# Patient Record
Sex: Female | Born: 1960 | Race: Black or African American | Hispanic: No | Marital: Single | Smoking: Never smoker
Health system: Southern US, Community
[De-identification: ages and names within clinical notes are randomized; demographics above are authoritative.]

## PROBLEM LIST (undated history)

## (undated) DIAGNOSIS — B009 Herpesviral infection, unspecified: Secondary | ICD-10-CM

## (undated) DIAGNOSIS — K5900 Constipation, unspecified: Secondary | ICD-10-CM

## (undated) DIAGNOSIS — A64 Unspecified sexually transmitted disease: Secondary | ICD-10-CM

## (undated) DIAGNOSIS — E559 Vitamin D deficiency, unspecified: Secondary | ICD-10-CM

## (undated) DIAGNOSIS — N809 Endometriosis, unspecified: Secondary | ICD-10-CM

## (undated) DIAGNOSIS — D219 Benign neoplasm of connective and other soft tissue, unspecified: Secondary | ICD-10-CM

## (undated) DIAGNOSIS — J302 Other seasonal allergic rhinitis: Secondary | ICD-10-CM

## (undated) DIAGNOSIS — N870 Mild cervical dysplasia: Secondary | ICD-10-CM

## (undated) DIAGNOSIS — M1990 Unspecified osteoarthritis, unspecified site: Secondary | ICD-10-CM

## (undated) DIAGNOSIS — M549 Dorsalgia, unspecified: Secondary | ICD-10-CM

## (undated) DIAGNOSIS — E739 Lactose intolerance, unspecified: Secondary | ICD-10-CM

## (undated) DIAGNOSIS — M2550 Pain in unspecified joint: Secondary | ICD-10-CM

## (undated) DIAGNOSIS — K219 Gastro-esophageal reflux disease without esophagitis: Secondary | ICD-10-CM

## (undated) DIAGNOSIS — G4700 Insomnia, unspecified: Secondary | ICD-10-CM

## (undated) DIAGNOSIS — N951 Menopausal and female climacteric states: Secondary | ICD-10-CM

## (undated) HISTORY — DX: Pain in unspecified joint: M25.50

## (undated) HISTORY — DX: Herpesviral infection, unspecified: B00.9

## (undated) HISTORY — DX: Unspecified osteoarthritis, unspecified site: M19.90

## (undated) HISTORY — DX: Vitamin D deficiency, unspecified: E55.9

## (undated) HISTORY — DX: Unspecified sexually transmitted disease: A64

## (undated) HISTORY — DX: Insomnia, unspecified: G47.00

## (undated) HISTORY — DX: Other seasonal allergic rhinitis: J30.2

## (undated) HISTORY — DX: Mild cervical dysplasia: N87.0

## (undated) HISTORY — DX: Constipation, unspecified: K59.00

## (undated) HISTORY — DX: Benign neoplasm of connective and other soft tissue, unspecified: D21.9

## (undated) HISTORY — DX: Dorsalgia, unspecified: M54.9

## (undated) HISTORY — DX: Endometriosis, unspecified: N80.9

## (undated) HISTORY — DX: Lactose intolerance, unspecified: E73.9

## (undated) HISTORY — DX: Menopausal and female climacteric states: N95.1

## (undated) HISTORY — DX: Gastro-esophageal reflux disease without esophagitis: K21.9

---

## 1990-05-15 DIAGNOSIS — N870 Mild cervical dysplasia: Secondary | ICD-10-CM

## 1990-05-15 HISTORY — PX: CERVIX LESION DESTRUCTION: SHX591

## 1990-05-15 HISTORY — DX: Mild cervical dysplasia: N87.0

## 1993-11-13 DIAGNOSIS — D219 Benign neoplasm of connective and other soft tissue, unspecified: Secondary | ICD-10-CM

## 1993-11-13 HISTORY — DX: Benign neoplasm of connective and other soft tissue, unspecified: D21.9

## 1998-08-06 ENCOUNTER — Other Ambulatory Visit: Admission: RE | Admit: 1998-08-06 | Discharge: 1998-08-06 | Payer: Self-pay | Admitting: *Deleted

## 1999-08-03 ENCOUNTER — Other Ambulatory Visit: Admission: RE | Admit: 1999-08-03 | Discharge: 1999-08-03 | Payer: Self-pay | Admitting: *Deleted

## 2000-07-18 ENCOUNTER — Ambulatory Visit: Admission: RE | Admit: 2000-07-18 | Discharge: 2000-07-18 | Payer: Self-pay | Admitting: Gynecology

## 2000-08-10 ENCOUNTER — Other Ambulatory Visit: Admission: RE | Admit: 2000-08-10 | Discharge: 2000-08-10 | Payer: Self-pay | Admitting: *Deleted

## 2000-08-13 HISTORY — PX: LAPAROSCOPY: SHX197

## 2000-08-21 ENCOUNTER — Encounter (INDEPENDENT_AMBULATORY_CARE_PROVIDER_SITE_OTHER): Payer: Self-pay | Admitting: *Deleted

## 2000-08-21 ENCOUNTER — Inpatient Hospital Stay (HOSPITAL_COMMUNITY): Admission: RE | Admit: 2000-08-21 | Discharge: 2000-08-24 | Payer: Self-pay | Admitting: Obstetrics and Gynecology

## 2001-07-14 HISTORY — PX: BREAST SURGERY: SHX581

## 2001-07-18 ENCOUNTER — Other Ambulatory Visit: Admission: RE | Admit: 2001-07-18 | Discharge: 2001-07-18 | Payer: Self-pay | Admitting: *Deleted

## 2001-07-23 ENCOUNTER — Encounter: Payer: Self-pay | Admitting: *Deleted

## 2001-07-23 ENCOUNTER — Encounter: Admission: RE | Admit: 2001-07-23 | Discharge: 2001-07-23 | Payer: Self-pay | Admitting: *Deleted

## 2002-08-06 ENCOUNTER — Other Ambulatory Visit: Admission: RE | Admit: 2002-08-06 | Discharge: 2002-08-06 | Payer: Self-pay | Admitting: *Deleted

## 2003-07-09 ENCOUNTER — Ambulatory Visit (HOSPITAL_COMMUNITY): Admission: RE | Admit: 2003-07-09 | Discharge: 2003-07-09 | Payer: Self-pay | Admitting: *Deleted

## 2003-07-15 HISTORY — PX: UMBILICAL EXPLORATION: SHX2596

## 2003-08-06 ENCOUNTER — Ambulatory Visit (HOSPITAL_COMMUNITY): Admission: RE | Admit: 2003-08-06 | Discharge: 2003-08-06 | Payer: Self-pay | Admitting: General Surgery

## 2003-08-06 ENCOUNTER — Encounter (INDEPENDENT_AMBULATORY_CARE_PROVIDER_SITE_OTHER): Payer: Self-pay | Admitting: Specialist

## 2003-09-11 ENCOUNTER — Other Ambulatory Visit: Admission: RE | Admit: 2003-09-11 | Discharge: 2003-09-11 | Payer: Self-pay | Admitting: *Deleted

## 2004-08-02 ENCOUNTER — Encounter: Admission: RE | Admit: 2004-08-02 | Discharge: 2004-08-02 | Payer: Self-pay | Admitting: *Deleted

## 2004-08-11 ENCOUNTER — Encounter: Admission: RE | Admit: 2004-08-11 | Discharge: 2004-08-11 | Payer: Self-pay | Admitting: *Deleted

## 2004-10-26 ENCOUNTER — Other Ambulatory Visit: Admission: RE | Admit: 2004-10-26 | Discharge: 2004-10-26 | Payer: Self-pay | Admitting: *Deleted

## 2005-02-13 DIAGNOSIS — N951 Menopausal and female climacteric states: Secondary | ICD-10-CM

## 2005-02-13 HISTORY — DX: Menopausal and female climacteric states: N95.1

## 2005-08-08 ENCOUNTER — Ambulatory Visit: Payer: Self-pay | Admitting: Family Medicine

## 2005-08-14 ENCOUNTER — Ambulatory Visit: Payer: Self-pay | Admitting: Family Medicine

## 2005-10-20 ENCOUNTER — Encounter: Admission: RE | Admit: 2005-10-20 | Discharge: 2005-10-20 | Payer: Self-pay | Admitting: Obstetrics and Gynecology

## 2005-11-07 ENCOUNTER — Other Ambulatory Visit: Admission: RE | Admit: 2005-11-07 | Discharge: 2005-11-07 | Payer: Self-pay | Admitting: Obstetrics & Gynecology

## 2006-06-14 ENCOUNTER — Ambulatory Visit: Payer: Self-pay | Admitting: Family Medicine

## 2007-01-29 ENCOUNTER — Ambulatory Visit (HOSPITAL_COMMUNITY): Admission: RE | Admit: 2007-01-29 | Discharge: 2007-01-29 | Payer: Self-pay | Admitting: Obstetrics & Gynecology

## 2007-02-18 ENCOUNTER — Other Ambulatory Visit: Admission: RE | Admit: 2007-02-18 | Discharge: 2007-02-18 | Payer: Self-pay | Admitting: Obstetrics and Gynecology

## 2007-09-14 DIAGNOSIS — B009 Herpesviral infection, unspecified: Secondary | ICD-10-CM

## 2007-09-14 DIAGNOSIS — A64 Unspecified sexually transmitted disease: Secondary | ICD-10-CM

## 2007-09-14 HISTORY — DX: Herpesviral infection, unspecified: B00.9

## 2007-09-14 HISTORY — DX: Unspecified sexually transmitted disease: A64

## 2007-09-20 ENCOUNTER — Other Ambulatory Visit: Admission: RE | Admit: 2007-09-20 | Discharge: 2007-09-20 | Payer: Self-pay | Admitting: Obstetrics and Gynecology

## 2007-11-04 ENCOUNTER — Other Ambulatory Visit: Admission: RE | Admit: 2007-11-04 | Discharge: 2007-11-04 | Payer: Self-pay | Admitting: Obstetrics and Gynecology

## 2008-02-14 DIAGNOSIS — E559 Vitamin D deficiency, unspecified: Secondary | ICD-10-CM

## 2008-02-14 HISTORY — DX: Vitamin D deficiency, unspecified: E55.9

## 2008-08-10 ENCOUNTER — Ambulatory Visit: Payer: Self-pay | Admitting: Family Medicine

## 2008-09-11 ENCOUNTER — Ambulatory Visit: Payer: Self-pay | Admitting: Family Medicine

## 2008-09-11 ENCOUNTER — Encounter: Admission: RE | Admit: 2008-09-11 | Discharge: 2008-09-11 | Payer: Self-pay | Admitting: Family Medicine

## 2008-10-02 ENCOUNTER — Ambulatory Visit: Payer: Self-pay | Admitting: Family Medicine

## 2008-11-16 ENCOUNTER — Ambulatory Visit: Payer: Self-pay | Admitting: Family Medicine

## 2009-01-29 ENCOUNTER — Ambulatory Visit (HOSPITAL_COMMUNITY): Admission: RE | Admit: 2009-01-29 | Discharge: 2009-01-29 | Payer: Self-pay | Admitting: Obstetrics and Gynecology

## 2009-06-10 ENCOUNTER — Encounter
Admission: RE | Admit: 2009-06-10 | Discharge: 2009-09-08 | Payer: Self-pay | Source: Home / Self Care | Admitting: Orthopedic Surgery

## 2009-12-14 HISTORY — PX: KNEE SURGERY: SHX244

## 2010-02-04 ENCOUNTER — Ambulatory Visit (HOSPITAL_COMMUNITY)
Admission: RE | Admit: 2010-02-04 | Discharge: 2010-02-04 | Payer: Self-pay | Source: Home / Self Care | Attending: Obstetrics and Gynecology | Admitting: Obstetrics and Gynecology

## 2010-03-06 ENCOUNTER — Encounter: Payer: Self-pay | Admitting: Obstetrics and Gynecology

## 2010-03-07 ENCOUNTER — Encounter: Payer: Self-pay | Admitting: Family Medicine

## 2010-03-07 ENCOUNTER — Encounter: Payer: Self-pay | Admitting: Obstetrics & Gynecology

## 2010-07-01 NOTE — Op Note (Signed)
NAME:  Crystal Simmons, Crystal Simmons                           ACCOUNT NO.:  [id number]   MEDICAL RECORD NO.:  [id number]                   PATIENT TYPE:  AMB   LOCATION:  DAY                                  FACILITY:  [facility]   PHYSICIAN:  Timothy E. Earlene Plater, M.D.              DATE OF BIRTH:  02/19/1960   DATE OF PROCEDURE:  08/06/2003  DATE OF DISCHARGE:                                 OPERATIVE REPORT   PREOPERATIVE DIAGNOSES:  Painful mass umbilicus.   POSTOPERATIVE DIAGNOSES:  Painful mass umbilicus, endometrioma.   OPERATION/PROCEDURE:  Exploration of abdominal wall, excision of mass.   SURGEON:  Timothy E. Earlene Plater, M.D.   ANESTHESIA:  General.   INDICATIONS FOR PROCEDURE:  [name] is 75, has a history of endometriosis  having had three prior surgeries at age 26, age 89 and age 45.  She  developed a painful mass over the past few months that has gotten  increasingly troublesome.  CT scan suggested endometrioma.  The patient  wishes to have a full excision. She agrees and understands. She is followed  closely by Andres Ege, M.D.  The patient was identified and the  permit signed.   DESCRIPTION OF PROCEDURE:  She was taken to the operating room, placed  supine, general endotracheal anesthesia administered.  The anterior  abdominal wall was prepped and draped in the usual fashion. There was a mass  on the right upper lateral curve of the umbilical indentation of  approximately 1 cm size.  I elected to use a prior laparoscopy horizontal  incision at the base of the umbilical indentation and did so, opened the  skin, dissected the subcutaneous tissue.  The apparent endometrioma was  incised including a portion of the depth of the umbilical skin and fascia  and the surrounding tissue. This had the appearance of an endometrioma  chocolate cyst type construction.  This was submitted for permanent section  and pathology with a copy to Andres Ege, M.D.  Bleeding was  controlled with  the cautery, the umbilical skin was carefully repaired from  beneath the skin with interrupted Monocryl sutures. The fascia was then  repaired with buried #0 Prolene sutures.  The subcutaneous tissue was  checked, all was clear, there was no bleeding.  The deep subcu was  approximated with 3-0 Vicryl, the skin was closed with 3-0 Monocryl.  Steri-  Strips applied and a dry sterile dressings.  Final counts correct. She  tolerated it well and was awakened and taken to the recovery room in good  condition.   Written and verbal instructions including Demerol with Phenergan tablets for  pain and she will be followed as an outpatient.  Timothy E. Earlene Plater, M.D.    TED/MEDQ  D:  08/06/2003  T:  08/06/2003  Job:  [id number]   cc:   Andres Ege, M.D.  [address], [address]  [place]  [place] [address]  Fax: [phone]

## 2010-07-01 NOTE — Op Note (Signed)
[facility]  Patient:    Crystal Simmons, Crystal Simmons                        MRN: [id number] Proc. Date: 08/21/00 Adm. Date:  [id number] Attending:  Jenean Lindau CC:         Laqueta Linden, M.D.  Telford Nab, R.N.   Operative Report  PREOPERATIVE DIAGNOSES:  Complex left adnexal mass.  Past history of multiple myomectomies and endometriosis.  POSTOPERATIVE DIAGNOSES:  Left ovarian fibroma, endometrioma.  Severe adhesions of the tube, ovary, small bowel, and sigmoid colon.  Retroperitoneal fibrosis.  Uterine fibroids.  PROCEDURE:  Exploratory laparotomy, extensive lysis of intestinal and tubal and ovarian adhesions, ureterolysis, left salpingo-oophorectomy.  CO-SURGEONS:  Laqueta Linden, M.D., Rande Brunt. Clarke-Pearson, M.D.  ASSISTANT:  Telford Nab, R.N.  ANESTHESIA:  General with orotracheal tube.  ESTIMATED BLOOD LOSS:  150 cc.  SURGICAL FINDINGS:  At exploratory laparotomy, the upper abdomen appeared normal.  The appendix appeared normal.  In the pelvis, there were multiple uterine fibroids from the fundus, most of which were subserosal, one of which was pedunculated.  The right tube and ovary were densely adherent to the right pelvic sidewall and cul-de-sac, but no other abnormality was noted.  The left tube and ovary appeared to contain a 3-4 cm ovarian fibroma as well as chocolate cyst consistent with endometriosis.  The internal aspect of the cyst had no papillations.  There was retroperitoneal fibrosis and dense adhesions of the tube and ovary to the left pelvic sidewall, broad ligament, sigmoid colon, and uterine fundus.  DESCRIPTION OF PROCEDURE:  The patient was brought to the operating room and after satisfactory attainment of general anesthesia, was placed in the modified lithotomy position and Allen stirrups.  The anterior abdominal wall, perineum, and vagina were prepped with Betadine, Foley catheter was placed, and the patient was  draped.  The abdomen was entered through a midline incision, excising the old scar.  Peritoneal washings were obtained from the pelvis, and the upper abdomen was explored.  Bookwalter retractor was assembled, and the bowel was packed out of the pelvis.  A loop of small bowel was found to be densely adherent deep in the cul-de-sac.  This was freed with sharp and blunt dissection.  The small bowel was inspected, and no injury to the serosa was found.  The remainder of the bowel was packed out of the pelvis.  The left retroperitoneal space was opened, identifying the external iliac artery, internal iliac artery, and ureter.  The ovarian vessels were skeletonized, clamped, cut, and suture ligated and as well as being free-tied with 2-0 Vicryl.  Using sharp and blunt dissection, the sigmoid colon was freed from the ovary.  In order to protect the ureter from resection of the left pelvic sidewall mass, ureterolysis performed using sharp and blunt dissection.  Hemoclips were used on occasion to achieve hemostasis.  Continued dissection of the broad ligament and the ovarian complex mass freed this from the deep pelvis and cul-de-sac.  The uterine ovarian anastomosis was cross-clamped, cut, and suture ligated using 2-0 Vicryl.  The left adnexal structures were inspected, and no evidence of malignancy was noted. Hemostasis was achieved in the pelvis using cautery and hemoclips.  The pelvis was irrigated with copious amounts of warm saline, and hemostasis was again noted.  Packs and retractors were removed.  The anterior abdominal wall was then closed in layers, the first being a running Smead-Jones closure  using #1 PDS.  The subcutaneous tissue was irrigated.  Hemostasis was achieved with cautery, and the subcutaneous tissue reapproximated in order to take tension off the skin incision; 3-0 Vicryl was used.  The skin was closed with skin staples.  A dressing was applied.  The patient was awakened from  anesthesia and taken to the recovery room in satisfactory condition.  Sponge, needle and instrument counts correct x 2. DD:  08/21/00 TD:  08/21/00 Job: [id number]

## 2010-07-01 NOTE — Discharge Summary (Signed)
[facility]  Patient:    Crystal Simmons, Crystal Simmons                        MRN: [id number] Adm. Date:  [id number] Disc. Date: [id number] Attending:  Jenean Lindau                           Discharge Summary  SERVICE:  Gynecology.  PRINCIPAL DIAGNOSIS ON DISCHARGE:  Recurrent endometriosis with endometrioma and hyalinized leiomyoma and severe tubo-ovarian adhesions of the left ovary and tube.  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  PROCEDURE:  Left salpingo-oophorectomy with exploratory laparotomy, extensive lysis of adhesions and ureterolysis.  COMPLICATIONS:  None.  TRANSFUSION:  None.  HISTORY OF PRESENT ILLNESS:  Crystal Simmons is a 50 year old African-American female with a long history of endometriosis who desired conservative management due to potential childbearing wishes.  She was noted recently to have a 2.4 x 2.0 cm solid mass in the left adnexa. with multiple associated cysts concerning for neoplasm versus endometriosis.  The patient was aware that she probably had extensive endometriosis throughout her pelvis but desired the most conservative surgery possible.  Due to her multiple prior surgeries and the anticipated complexity of the procedure, this procedure was scheduled with Dr. Emmaline Kluver who saw and evaluated the patient preoperatively. Please see dictated history and physical for full details  HOSPITAL COURSE:  The patient was admitted for same-day surgery having undergone a mechanical bowel prep and underwent the above-noted procedure without complications.  She was noted to have severe pelvic adhesions, retroperitoneal fibrosis, uterine fibroids, an endometrioma, and what was felt to be a left ovarian fibroma.  Estimated blood loss at surgery was 150 cc and she remained stable throughout the procedure.  Postoperatively, she had some initial severe nausea that finally improved  with Zofran.  She maintained excellent urinary output and stable vital signs.  On postoperative day #1, she began tolerating liquids and switched to oral analgesics.  She was voiding without problems.  Her hemoglobin was 10.0 down from a preoperative value of 12.2.  Her T max was 100.0 on postoperative day #1.  She advanced her diet to full liquids and continued to ambulate.  On postoperative day #2, she had had no flatus and was feeling sort of nauseated and bloated, also dizzy when she stood up.  However, she was able to ambulate and had normal bowel sounds and stable vital signs.  She was given simethicone and antiemetics and was reevaluated on postoperative day #3.  At that point, she reported that she had passed large amounts of flatus and felt much better. She was tolerating a general diet.  She had remained afebrile since the isolated temperature elevated to 100.  Her incision was healing well and staples were still in place at the time of discharge on postoperative day #3 in improved condition.  Pathology was reviewed with the patient and revealed endometriosis, a hyalinized leiomyoma, tubo-ovarian adhesions with hydrosalpinx with no evidence of malignancy.  The patient was discharged home on postoperative day #3.  She was given routine discharge instructions including both written and verbal instructions. She is to follow up in the office in one week for staple removal.  She was told to continue taking Advil or Aleve for pain.  She was given a prescription for Vicodin ES, dispensed #15, one to two q.4-6h. p.r.n. pain with one refill as she  became nauseated with the Percocet.  She was also given a prescription for Phenergan 25 mg, dispensed #10, one half to one p.o. q.4-6h. p.r.n. nausea with one refill and told to continue taking her birth control pills and was given a prescription for Mircette one cycle with three refills to discuss the long-term contraception plan after her  follow-up visit.  She was told to follow up in the office prior to her scheduled follow-up for excessive pain, fever, bleeding, incision problems, symptoms of bowel or bladder dysfunction, or symptoms of a DVT or a PE, or any other concerns. DD:  09/11/00 TD:  09/11/00 Job: [id number]

## 2010-07-01 NOTE — Consult Note (Signed)
[facility]  Patient:    Crystal Simmons, Crystal Simmons                        MRN: [id number] Proc. Date: 07/18/00 Adm. Date:  [id number] Attending:  Jeannette Corpus CC:         Andres Ege, M.D.  Laqueta Linden, M.D.  Telford Nab, R.N.   Consultation Report  HISTORY OF PRESENT ILLNESS:  [age]-year-old African-American female was seen at the request of Drs. Laqueta Linden and Katherine Mantle regarding management of a complex mass.  The patient has a long-standing history of uterine fibroids and ovarian cysts. In 1981, she underwent exploratory laparotomy.  The details are not fully known, but it sounds as though she had myomectomies and conservative resection of endometriosis.  The patient has attempted for over a decade to get pregnant but never has.  In 1991, she underwent laparoscopic evaluation and claims an ovarian cyst was removed at that time.  More recently, she has been under the care of Dr. Katherine Mantle, who has followed with ultrasound a mass in the left adnexa.  On most recent exam she has a 2.4 x 2.0 cm solid mass in the adnexa, as well as several associated cysts that have some thickening and some septations.  The uterus is felt slightly enlarged, consistent with fibroids which measure up to 4 cm in diameter.  She has regular cyclic menstrual periods although she does have some dysmenorrhea.  PAST MEDICAL HISTORY:  None.  PAST SURGICAL HISTORY:  See history of present illness.  DRUG ALLERGIES:  SULFA and CODEINE.  OBSTETRICAL HISTORY:  Gravida 0, para 0.  CURRENT MEDICATIONS:  Allegra, Flonase, and multivitamins.  FAMILY HISTORY:  Negative for gynecologic, breast, or colon cancer.  SOCIAL HISTORY:  The patient is single, does not smoke.  REVIEW OF SYSTEMS:  Entirely negative.  PHYSICAL EXAMINATION:  VITAL SIGNS:  Height 5 feet 7 inches, weight 190 pounds.  Blood pressure 118/80, pulse 80, respiratory rate  16.  GENERAL:  Healthy, African-American female in no acute distress.  HEENT:  Negative.  NECK:  Supple.  Without thyromegaly.  There is no supraclavicular or inguinal adenopathy.  ABDOMEN:  Soft and nontender.  No masses, organomegaly, ascites, or hernias noted.  She has a well-healed subumbilical laparoscopy scar as well as a midline incision in the lower abdomen.  PELVIC:  EGBUS is normal.  Vagina is clean, well-supported.  Cervix is normal. The uterus is anterior, approximately [redacted] weeks gestational size, with multiple fibroids.  I do not feel any discrete pelvic masses.  The records from Dr. Larence Penning office, including the ultrasound reports, are reviewed.  IMPRESSION:  Complex adnexal mass, increasing in size.  This is most likely endometriosis, but I agree with Dr. Laurena Bering that surgical intervention is appropriate.  We recommend she undergo exploratory laparotomy through her prior midline incision.  The plan will be to remove the ovary and cyst on that side if at all possible.  She would prefer to preserve the uterus and other ovary, and we will make every effort for that to be her outcome.  The risks of surgery, including hemorrhage, infection, injury to adjacent viscera, and thromboembolic complications, were outlined.  The patient accepts these risks and wishes to proceed with surgery, which is currently scheduled for August 21, 2000. DD:  07/18/00 TD:  07/18/00 Job: [id number]

## 2010-09-26 HISTORY — PX: COLONOSCOPY W/ BIOPSIES: SHX1374

## 2010-09-26 LAB — HM COLONOSCOPY

## 2010-11-14 HISTORY — PX: COLPOSCOPY: SHX161

## 2011-01-02 ENCOUNTER — Other Ambulatory Visit: Payer: Self-pay | Admitting: Nurse Practitioner

## 2011-01-02 DIAGNOSIS — Z1231 Encounter for screening mammogram for malignant neoplasm of breast: Secondary | ICD-10-CM

## 2011-02-13 ENCOUNTER — Ambulatory Visit (HOSPITAL_COMMUNITY): Payer: Self-pay

## 2011-03-02 ENCOUNTER — Ambulatory Visit (HOSPITAL_COMMUNITY): Payer: BC Managed Care – PPO | Attending: Nurse Practitioner

## 2011-04-25 ENCOUNTER — Other Ambulatory Visit: Payer: Self-pay | Admitting: Obstetrics and Gynecology

## 2011-04-25 DIAGNOSIS — Z1231 Encounter for screening mammogram for malignant neoplasm of breast: Secondary | ICD-10-CM

## 2011-05-18 ENCOUNTER — Ambulatory Visit (HOSPITAL_COMMUNITY)
Admission: RE | Admit: 2011-05-18 | Discharge: 2011-05-18 | Disposition: A | Discharge status: Home / Self Care | Payer: BC Managed Care – PPO | Source: Ambulatory Visit | Attending: Obstetrics and Gynecology | Admitting: Obstetrics and Gynecology

## 2011-05-18 DIAGNOSIS — Z1231 Encounter for screening mammogram for malignant neoplasm of breast: Secondary | ICD-10-CM

## 2011-09-14 ENCOUNTER — Ambulatory Visit (INDEPENDENT_AMBULATORY_CARE_PROVIDER_SITE_OTHER): Payer: BC Managed Care – PPO | Admitting: Family Medicine

## 2011-09-14 ENCOUNTER — Encounter: Payer: Self-pay | Admitting: Family Medicine

## 2011-09-14 VITALS — BP 120/70 | HR 90 | Resp 14 | Ht 67.5 in | Wt 196.0 lb

## 2011-09-14 DIAGNOSIS — M62838 Other muscle spasm: Secondary | ICD-10-CM

## 2011-09-14 DIAGNOSIS — M542 Cervicalgia: Secondary | ICD-10-CM

## 2011-09-14 MED ORDER — CYCLOBENZAPRINE HCL 10 MG PO TABS: 10.0000 mg | ORAL_TABLET | Freq: Three times a day (TID) | ORAL | Status: AC | PRN

## 2011-09-14 MED ORDER — MELOXICAM 15 MG PO TABS: ORAL_TABLET | ORAL | Status: DC

## 2011-09-14 NOTE — Progress Notes (Signed)
Chief complaint:  Right sided neck pain  HPI:  She awoke with right sided neck pain about 2 weeks ago.  Originally though she slept wrong, but it hasn't resolved.  She has tried ibuprofen (600mg  every 4-5 hours), heat, thermacare, ice, stretches, rubbing/massaging muscles.  Didn't get any significant relief, except for some easing of pain with ibuprofen.  Denies radiation of pain, denies numbness, tingling or weakness.  Past Medical History  Diagnosis Date  . Endometriosis    Past Surgical History  Procedure Date  . Laparoscopy     multiple surgeries, including ovarectomy for endometriosis  . Knee surgery     left for meniscal tear   History   Social History  . Marital Status: Single    Spouse Name: N/A    Number of Children: 0  . Years of Education: N/A   Occupational History  . [employer]    Social History Main Topics  . Smoking status: Never Smoker   . Smokeless tobacco: Never Used  . Alcohol Use: Yes     infrequent  . Drug Use: Not on file  . Sexually Active: Not on file   Other Topics Concern  . Not on file   Social History Narrative  . No narrative on file   Current outpatient prescriptions:cetirizine (ZYRTEC) 10 MG tablet, Take 10 mg by mouth daily., Disp: , Rfl: ;  fish oil-omega-3 fatty acids 1000 MG capsule, Take 2 g by mouth daily., Disp: , Rfl: ;  Multiple Vitamins-Minerals (MULTIVITAMIN WITH MINERALS) tablet, Take 1 tablet by mouth daily., Disp: , Rfl:   Allergies  Allergen Reactions  . Codeine   . Sulfa Antibiotics    ROS: Denies fevers, GI complaints, numbness, tingling, weakness.  No other joint pains.  Mild sinus headache/allergy.  PHYSICAL EXAM: BP 120/70  Pulse 90  Resp 14  Ht 5' 7.5" (1.715 m)  Wt 196 lb (88.905 kg)  BMI 30.24 kg/m2  SpO2 99% Well developed, pleasant female, only in discomfort if/when she moves her head suddenly (ie when she nods an answer) HEENT:  Atraumatic.  PERRL, conjunctiva clear. Neck: FROM.  Has some  catching/pain only when looking over her right shoulder, with FROM.  No spinal tenderness.  There are two palpable areas of spasm/trigger point--one within the trapezius muscle on right, and one at the upper portion of rhomboids.  Some crepitus noted. Normal strength, sensation and DTR's.  ASSESSMENT/PLAN: 1. Neck pain  meloxicam (MOBIC) 15 MG tablet  2. Muscle spasms of neck  cyclobenzaprine (FLEXERIL) 10 MG tablet   R sided muscle spasm in trapazius/rhomboids.   Heat/ice, massage, stretches and strengthening exercises shown.  Stop ibuprofen.  Change to meloxicam 15mg  once daily (1/2 - 1 daily). Flexeril--take at night.  May try 1/2 tab during the day if needed, cautioned about sedation.   Risks and side effects of medications were reviewed.  If no improvement in the next few days, can call for referral to PT.  Can also call if she is interested in trying a less sedating muscle relaxant to use during the day.  Discussed importance of proper ergonomics at work  25 minute visit

## 2011-09-14 NOTE — Patient Instructions (Addendum)
meloxicam is anti-inflammatory similar to ibuprofen.  Do not use both together.  You can try just 1/2 tablet if it bothers your stomach--may use up to one full tablet just once daily.  Flexeril--use just at bedtime due to sedation.  If you want to try it during the day, try at just 1/2 tablet, and make sure you aren't driving.  If it is too sedating, and you require muscle relaxant during the day, call for a less sedating medication to use during day (Robaxin).  Continue to use heat/ice, stretches and exercises as shown.  If you don't improve over the next week, call for a referral to physical therapy.

## 2011-09-21 ENCOUNTER — Telehealth: Payer: Self-pay | Admitting: Family Medicine

## 2011-09-21 DIAGNOSIS — M62838 Other muscle spasm: Secondary | ICD-10-CM

## 2011-09-21 MED ORDER — METHOCARBAMOL 500 MG PO TABS: 500.0000 mg | ORAL_TABLET | Freq: Three times a day (TID) | ORAL | Status: DC

## 2011-09-21 NOTE — Telephone Encounter (Signed)
Spoke with patient to let her know that Robaxin 500mg , 1-2 tablets q8hrs prn for muscle spasm was sent to [facility] [address].

## 2011-09-21 NOTE — Telephone Encounter (Signed)
Okay to rx Robaxin 500mg , 1-2 tablets every 8 hours as needed for muscle spasm.  This shouldn't be sedating (or at least much less). #30

## 2011-11-16 LAB — HM PAP SMEAR: HM Pap smear: NEGATIVE

## 2012-07-15 ENCOUNTER — Other Ambulatory Visit: Payer: Self-pay | Admitting: Obstetrics and Gynecology

## 2012-07-15 DIAGNOSIS — Z1231 Encounter for screening mammogram for malignant neoplasm of breast: Secondary | ICD-10-CM

## 2012-07-18 ENCOUNTER — Ambulatory Visit (HOSPITAL_COMMUNITY): Payer: BC Managed Care – PPO

## 2012-07-25 ENCOUNTER — Ambulatory Visit (HOSPITAL_COMMUNITY): Payer: BC Managed Care – PPO

## 2012-08-01 ENCOUNTER — Ambulatory Visit (HOSPITAL_COMMUNITY)
Admission: RE | Admit: 2012-08-01 | Discharge: 2012-08-01 | Disposition: A | Discharge status: Home / Self Care | Payer: BC Managed Care – PPO | Source: Ambulatory Visit | Attending: Obstetrics and Gynecology | Admitting: Obstetrics and Gynecology

## 2012-08-01 DIAGNOSIS — Z1231 Encounter for screening mammogram for malignant neoplasm of breast: Secondary | ICD-10-CM

## 2012-09-06 ENCOUNTER — Ambulatory Visit (INDEPENDENT_AMBULATORY_CARE_PROVIDER_SITE_OTHER): Payer: BC Managed Care – PPO | Admitting: Medical

## 2012-09-06 ENCOUNTER — Encounter: Payer: Self-pay | Admitting: Medical

## 2012-09-06 VITALS — BP 120/80 | HR 80 | Temp 98.0°F | Resp 16 | Wt 205.0 lb

## 2012-09-06 DIAGNOSIS — S39012A Strain of muscle, fascia and tendon of lower back, initial encounter: Secondary | ICD-10-CM

## 2012-09-06 DIAGNOSIS — M5380 Other specified dorsopathies, site unspecified: Secondary | ICD-10-CM

## 2012-09-06 DIAGNOSIS — M62830 Muscle spasm of back: Secondary | ICD-10-CM

## 2012-09-06 DIAGNOSIS — IMO0002 Reserved for concepts with insufficient information to code with codable children: Secondary | ICD-10-CM

## 2012-09-06 MED ORDER — LIDOCAINE 5 % EX PTCH: 1.0000 | MEDICATED_PATCH | CUTANEOUS | Status: DC

## 2012-09-06 MED ORDER — METHOCARBAMOL 500 MG PO TABS: 500.0000 mg | ORAL_TABLET | Freq: Three times a day (TID) | ORAL | Status: DC

## 2012-09-06 NOTE — Progress Notes (Signed)
Subjective:    Crystal Simmons is a 52 y.o. female who presents for evaluation of back pain.  Interestingly, had pulled muscle in back about a year ago this time.  The patient has had recurrent self limited episodes of low back pain in the past. Symptoms have been present for 6 days and are unchanged.  Onset was related to no known injury, but has been exercising, lifting, weights, going to the gym, was doing a lot of push ups last week.  The pain is located in the interscapular area and does not radiate. The pain is described as aching and soreness and occurs intermittently. She rates her pain as moderate. Symptoms are exacerbated by flexion. Symptoms are improved by Lidoderm patch of a coworker, Ibuprofen. She has also tried heat and ice which provided some symptom relief. She has no other symptoms associated with the back pain. The patient has no "red flag" history indicative of complicated back pain.  The following portions of the patient's history were reviewed and updated as appropriate: allergies, current medications, past family history, past medical history, past social history, past surgical history and problem list.  Review of Systems As in subjective     Objective:    Filed Vitals:   09/06/12 0810  BP: 120/80  Pulse: 80  Temp: 98 F (36.7 C)  Resp: 16    General appearance: alert, no distress, WD/WN, female Neck: supple, mild pain with neck flexion and extension, no lymphadenopathy, no thyromegaly, no masses, otherwise normal ROM Chest: non tender, normal shape and expansion Back: mild tenderness left paraspinal region of thorax, interscapular, right sided nontender, rest of back nontender, no obvious deformity ,normal ROM Extremities: no edema, no cyanosis, no clubbing Pulses: 2+ symmetric   Assessment:    Encounter Diagnoses  Name Primary?  . Strain, back, initial encounter Yes  . Back spasm     Plan:     Natural history and expected course discussed. Questions  answered. Agricultural engineer distributed. Proper lifting, bending technique discussed. Stretching exercises discussed. Regular aerobic and trunk strengthening exercises discussed. Short (2-4 day) period of relative rest recommended until acute symptoms improve. Heat to affected area as needed for local pain relief. NSAIDs - c/t OTC Ibuprofen Muscle relaxants per medication orders. Lidoderm patch prescribed at her request. F/u prn if not improving or worse

## 2012-09-06 NOTE — Patient Instructions (Signed)
Thoracic Strain  You have injured the muscles or tendons that attach to the upper part of your back behind your chest. This injury is called a thoracic strain, thoracic sprain, or mid-back strain.   CAUSES   The cause of thoracic strain varies. A less severe injury involves pulling a muscle or tendon without tearing it. A more severe injury involves tearing (rupturing) a muscle or tendon. With less severe injuries, there may be little loss of strength. Sometimes, there are breaks (fractures) in the bones to which the muscles are attached. These fractures are rare, unless there was a direct hit (trauma) or you have weak bones due to osteoporosis or age. Longstanding strains may be caused by overuse or improper form during certain movements. Obesity can also increase your risk for back injuries. Sudden strains may occur due to injury or not warming up properly before exercise. Often, there is no obvious cause for a thoracic strain.  SYMPTOMS   The main symptom is pain, especially with movement, such as during exercise.  DIAGNOSIS   Your caregiver can usually tell what is wrong by taking an X-ray and doing a physical exam.  TREATMENT    Physical therapy may be helpful for recovery. Your caregiver can give you exercises to do or refer you to a physical therapist after your pain improves.   After your pain improves, strengthening and conditioning programs appropriate for your sport or occupation may be helpful.   Always warm up before physical activities or athletics. Stretching after physical activity may also help.   Certain over-the-counter medicines may also help. Ask your caregiver if there are medicines that would help you.  If this is your first thoracic strain injury, proper care and proper healing time before starting activities should prevent long-term problems. Torn ligaments and tendons require as long to heal as broken bones. Average healing times may be only 1 week for a mild strain. For torn muscles  and tendons, healing time may be up to 6 weeks to 2 months.  HOME CARE INSTRUCTIONS    Apply ice to the injured area. Ice massages may also be used as directed.   Put ice in a plastic bag.   Place a towel between your skin and the bag.   Leave the ice on for 15-20 minutes, 3-4 times a day, for the first 2 days.   Only take over-the-counter or prescription medicines for pain, discomfort, or fever as directed by your caregiver.   Keep your appointments for physical therapy if this was prescribed.   Use wraps and back braces as instructed.  SEEK IMMEDIATE MEDICAL CARE IF:    You have an increase in bruising, swelling, or pain.   Your pain has not improved with medicines.   You develop new shortness of breath, chest pain, or fever.   Problems seem to be getting worse rather than better.  MAKE SURE YOU:    Understand these instructions.   Will watch your condition.   Will get help right away if you are not doing well or get worse.  Document Released: 04/22/2003 Document Revised: 04/24/2011 Document Reviewed: 03/18/2010  [employer] Patient Information 2014 [employer]

## 2012-10-21 ENCOUNTER — Ambulatory Visit (INDEPENDENT_AMBULATORY_CARE_PROVIDER_SITE_OTHER): Payer: BC Managed Care – PPO | Admitting: Family Medicine

## 2012-10-21 VITALS — BP 110/82 | HR 72 | Wt 210.0 lb

## 2012-10-21 DIAGNOSIS — J019 Acute sinusitis, unspecified: Secondary | ICD-10-CM

## 2012-10-21 MED ORDER — FLUCONAZOLE 150 MG PO TABS: 150.0000 mg | ORAL_TABLET | Freq: Once | ORAL | Status: DC

## 2012-10-21 MED ORDER — AMOXICILLIN 875 MG PO TABS: 875.0000 mg | ORAL_TABLET | Freq: Two times a day (BID) | ORAL | Status: DC

## 2012-10-21 NOTE — Patient Instructions (Signed)
Take all the antibiotic and use of Diflucan as needed if you are not fully back to normal when you finish the antibiotic call back

## 2012-10-21 NOTE — Progress Notes (Signed)
  Subjective:    Patient ID: Crystal Simmons, female    DOB: 29-Oct-1960, 52 y.o.   MRN: [id number]  HPI She has a one-week history of runny nose, PND but no sore throat, fever, chills or earache. 3 days ago she noted the onset of maxillary sinus headache. She also notes upper tooth discomfort on the left. She does have difficulty with seasonal allergies but none at the present time . She does not smoke   Review of Systems     Objective:   Physical Exam alert and in no distress. Tympanic membranes and canals are normal. Throat is clear. Tonsils are normal. Neck is supple without adenopathy or thyromegaly. Cardiac exam shows a regular sinus rhythm without murmurs or gallops. Lungs are clear to auscultation. Nasal mucosa is normal but she does have tenderness especially over left maxillary sinus       Assessment & Plan:  Acute sinusitis - Plan: amoxicillin (AMOXIL) 875 MG tablet  she is to call if not entirely better. She was also given Diflucan to help with antibiotic-induced vaginitis.

## 2012-11-25 ENCOUNTER — Encounter: Payer: Self-pay | Admitting: Nurse Practitioner

## 2012-11-25 ENCOUNTER — Ambulatory Visit (INDEPENDENT_AMBULATORY_CARE_PROVIDER_SITE_OTHER): Payer: BC Managed Care – PPO | Admitting: Nurse Practitioner

## 2012-11-25 VITALS — BP 110/66 | HR 64 | Resp 20 | Ht 68.5 in | Wt 211.0 lb

## 2012-11-25 DIAGNOSIS — E559 Vitamin D deficiency, unspecified: Secondary | ICD-10-CM

## 2012-11-25 DIAGNOSIS — Z0000 Encounter for general adult medical examination without abnormal findings: Secondary | ICD-10-CM

## 2012-11-25 DIAGNOSIS — Z113 Encounter for screening for infections with a predominantly sexual mode of transmission: Secondary | ICD-10-CM

## 2012-11-25 DIAGNOSIS — Z01419 Encounter for gynecological examination (general) (routine) without abnormal findings: Secondary | ICD-10-CM

## 2012-11-25 DIAGNOSIS — Z23 Encounter for immunization: Secondary | ICD-10-CM

## 2012-11-25 MED ORDER — ZOLPIDEM TARTRATE 10 MG PO TABS: 10.0000 mg | ORAL_TABLET | Freq: Every evening | ORAL | Status: DC | PRN

## 2012-11-25 MED ORDER — VITAMIN D (ERGOCALCIFEROL) 1.25 MG (50000 UNIT) PO CAPS: 50000.0000 [IU] | ORAL_CAPSULE | ORAL | Status: DC

## 2012-11-25 NOTE — Patient Instructions (Signed)

## 2012-11-25 NOTE — Progress Notes (Signed)
Patient ID: Crystal Simmons, female   DOB: 1960/09/19, 52 y.o.   MRN: [id number] 52 y.o. G0 Single African American Fe here for annual exam.   same partner Patient's last menstrual period was 10/14/2005.          Sexually active: yes  The current method of family planning is post menopausal status.    Exercising: yes  Gym/ health club routine includes light weights, treadmill and eliptical and walking 3 times per week.  weights at least 2 times per week.. Smoker:  no  Health Maintenance: Pap: 11/16/11, WNL, neg HR HPV MMG: 08/02/12, BI-RADS 1: negative Colonoscopy: 09/26/10, polyp, recall 5 years BMD: never TDaP: >10 years, will update today Labs: HB: 11.9 Urine:  Negative, pH 7.0   reports that she has never smoked. She has never used smokeless tobacco. She reports that she drinks alcohol. She reports that she does not use illicit drugs.  Past Medical History  Diagnosis Date  . Endometriosis   . 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 Fibroids 10/95  . 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 Insomnia   . Menopausal state 2007  . STD (sexually transmitted disease) 8/09    chlamydia  . Vitamin D deficiency disease 2010    Past Surgical History  Procedure Laterality Date  . Laparoscopy  7/02    multiple surgeries, including LSO, LOA, and endometrioma, and fibroids secondary to endometriosis  . Knee surgery  12/2009    left for meniscal tear  . Colposcopy  10/12    CIN I  . Cervix lesion destruction  4/92  . Umbilical exploration  6/05    for endometrioma   . Breast surgery Left 6/03    Biopsy negative  . Colonoscopy w/ biopsies  09/26/10    polyp benign    Current Outpatient Prescriptions  Medication Sig Dispense Refill  . cetirizine (ZYRTEC) 10 MG tablet Take 10 mg by mouth daily.      . ferrous sulfate 325 (65 FE) MG tablet Take 325 mg by mouth 2 (two) times a week.      . fish oil-omega-3 fatty acids 1000 MG capsule Take 2 g by mouth daily.      . fluticasone  (FLONASE) 50 MCG/ACT nasal spray Place 2 sprays into the nose daily.      . Multiple Vitamins-Minerals (MULTIVITAMIN WITH MINERALS) tablet Take 1 tablet by mouth daily.      . Vitamin D, Ergocalciferol, (DRISDOL) 50000 UNITS CAPS capsule Take 1 capsule (50,000 Units total) by mouth once a week.  30 capsule  3  . zolpidem (AMBIEN) 10 MG tablet Take 1 tablet (10 mg total) by mouth at bedtime as needed for sleep.  30 tablet  5   No current facility-administered medications for this visit.    Family History  Problem Relation Age of Onset  . Hypertension Mother   . Diabetes Father   . Hypertension Father   . Diabetes Brother   . Diabetes Paternal Grandfather     ROS:  Pertinent items are noted in HPI.  Otherwise, a comprehensive ROS was negative.  Exam:   BP 110/66  Pulse 64  Resp 20  Ht 5' 8.5" (1.74 m)  Wt 211 lb (95.709 kg)  BMI 31.61 kg/m2  LMP 10/14/2005 Height: 5' 8.5" (174 cm)  Ht Readings from Last 3 Encounters:  11/25/12 5' 8.5" (1.74 m)  09/14/11 5' 7.5" (1.715 m)    General appearance: alert,  cooperative and appears stated age Head: Normocephalic, without obvious abnormality, atraumatic Neck: no adenopathy, supple, symmetrical, trachea midline and thyroid normal to inspection and palpation Lungs: clear to auscultation bilaterally Breasts: normal appearance, no masses or tenderness Heart: regular rate and rhythm Abdomen: soft, non-tender; no masses,  no organomegaly Extremities: extremities normal, atraumatic, no cyanosis or edema Skin: Skin color, texture, turgor normal. No rashes or lesions Lymph nodes: Cervical, supraclavicular, and axillary nodes normal. No abnormal inguinal nodes palpated Neurologic: Grossly normal   Pelvic: External genitalia:  no lesions              Urethra:  normal appearing urethra with no masses, tenderness or lesions              Bartholin's and Skene's: normal                 Vagina: normal appearing vagina with normal color and  discharge, no lesions              Cervix: anteverted              Pap taken: no Bimanual Exam:  Uterus:  normal size, contour, position, consistency, mobility, non-tender              Adnexa: no mass, fullness, tenderness               Rectovaginal: Confirms               Anus:  normal sphincter tone, no lesions  A:  Well Woman with normal exam  Postmenopausal  R/O STD's  Multiple laparotomies due to endometriosis and endometrioma  S/P LSO, LOA 08/2000  Update immunization  Chronic insomnia  P:   Pap smear as per guidelines   Mammogram due 6/15  Refill Ambien 10 mg to use 1/2 prn # 30/5  Refill Vit D and follow with labs  TDaP given today  Counseled on breast self exam, adequate intake of calcium and vitamin D, diet and exercise return annually or prn  An After Visit Summary was printed and given to the patient.

## 2012-11-26 LAB — STD PANEL: HIV: NONREACTIVE

## 2012-11-26 LAB — POCT URINALYSIS DIPSTICK
Nitrite, UA: NEGATIVE
Urobilinogen, UA: NEGATIVE

## 2012-11-26 LAB — HEMOGLOBIN, FINGERSTICK: Hemoglobin, fingerstick: 11.9 g/dL — ABNORMAL LOW (ref 12.0–16.0)

## 2012-11-28 NOTE — Progress Notes (Signed)
Encounter reviewed by Dr. Sunshine Mackowski Silva.  

## 2012-12-10 ENCOUNTER — Other Ambulatory Visit: Payer: Self-pay | Admitting: Nurse Practitioner

## 2012-12-19 ENCOUNTER — Other Ambulatory Visit: Payer: Self-pay

## 2012-12-20 ENCOUNTER — Ambulatory Visit: Payer: BC Managed Care – PPO | Admitting: Medical

## 2013-01-17 ENCOUNTER — Other Ambulatory Visit: Payer: Self-pay | Admitting: Nurse Practitioner

## 2013-01-17 NOTE — Telephone Encounter (Signed)
PC to pt to let her know prescriptions were called to pharmacy in October.  Pt states she has called pharmacy and they do not have either Vitamin D or Ambien RX.   I spoke to [place] at pharmacy, she states she did receive RX for Vitamin D, but did not receive RX for Ambien. Verbal order given for Ambien.

## 2013-04-14 ENCOUNTER — Telehealth: Payer: Self-pay | Admitting: Family Medicine

## 2013-04-14 NOTE — Telephone Encounter (Signed)
Pt states she is really sore from the fall and denies any other symptoms, she states she has been icing the areas and taking ibuprofen and she will call back if other symptoms occur.

## 2013-04-14 NOTE — Telephone Encounter (Signed)
Pt slipped in ice and hit head. She has no headache and no nausea. Pt states she did put ice on head. Pt would like to know any symptoms she soul look out for. Pt can be reached at work [phone] or cell [phone].

## 2013-04-14 NOTE — Telephone Encounter (Signed)
The things to be concerned about are worsening headache, protracted vomiting and loss of orientation.

## 2013-05-12 ENCOUNTER — Other Ambulatory Visit: Payer: Self-pay | Admitting: Nurse Practitioner

## 2013-05-12 NOTE — Telephone Encounter (Signed)
Last AEX 11/25/2012 Last refill 11/25/2012 Last Vitamin D check 11/25/2012 = 44 N Next appt 11/28/13  Please approve or deny Rx.

## 2013-07-03 ENCOUNTER — Other Ambulatory Visit: Payer: Self-pay | Admitting: Nurse Practitioner

## 2013-07-03 DIAGNOSIS — Z1231 Encounter for screening mammogram for malignant neoplasm of breast: Secondary | ICD-10-CM

## 2013-07-29 ENCOUNTER — Other Ambulatory Visit: Payer: Self-pay | Admitting: *Deleted

## 2013-07-29 NOTE — Telephone Encounter (Signed)
[facility] Last Refilled: 11/25/12 #30/5 refills Last aex: 11/25/12 Aex Scheduled: 11/28/13  Okay to refill until Aex  Please advise.  (sent to Ms. Debbie given Ms. Chong Sicilian is out for the week.)

## 2013-07-31 ENCOUNTER — Other Ambulatory Visit: Payer: Self-pay | Admitting: Obstetrics and Gynecology

## 2013-07-31 MED ORDER — ZOLPIDEM TARTRATE 10 MG PO TABS: 10.0000 mg | ORAL_TABLET | Freq: Every evening | ORAL | Status: DC | PRN

## 2013-07-31 NOTE — Telephone Encounter (Signed)
Last AEX;11/25/12 with PG Last refill:11/25/12 #30-5 refills Current AEX:11/28/13 with PG  Please advise

## 2013-08-01 NOTE — Telephone Encounter (Signed)
Patient is asking for a refill for estrace cream said that she was given samples and now  Needs the rx for it.  [facility] [phone]

## 2013-08-01 NOTE — Telephone Encounter (Signed)
Routing to Dr.Lathrop for review and advise as Milford Cage, FNP is out of the office.

## 2013-08-05 ENCOUNTER — Telehealth: Payer: Self-pay | Admitting: Nurse Practitioner

## 2013-08-05 MED ORDER — ESTRADIOL 0.1 MG/GM VA CREA: TOPICAL_CREAM | VAGINAL | Status: DC

## 2013-08-05 NOTE — Telephone Encounter (Signed)
Patient calling to check on status of refill request. Telephone note was closed.

## 2013-08-05 NOTE — Addendum Note (Signed)
Addended by: Alfonzo Feller on: 08/05/2013 02:08 PM   Modules accepted: Orders

## 2013-08-05 NOTE — Telephone Encounter (Signed)
Patient calling to check on status of refill request.  Jasmine Awe, RN at 08/01/2013 11:13 AM    Status: Signed        Routing to Dr.Lathrop for review and advise as Milford Cage, FNP is out of the office.        Tiffany A Decker at 08/01/2013 10:40 AM    Status: Signed        Patient is asking for a refill for estrace cream said that she was given samples and now Needs the rx for it.  [facility]  [place], [place] at 07/29/2013 10:31 AM    Status: Signed        [facility]  Last Refilled: 11/25/12 #30/5 refills  Last aex: 11/25/12  Aex Scheduled: 11/28/13  Okay to refill until Aex  Please advise.  (sent to Ms. Debbie given Ms. Chong Sicilian is out for the week.)

## 2013-08-05 NOTE — Telephone Encounter (Signed)
Patient notified that both rx's have been sent to the pharmacy also confirmed her AEX appointment.

## 2013-08-05 NOTE — Telephone Encounter (Addendum)
Patient said she was given samples don't see where that is documented in her chart?  Last AEX 11/25/12 AEX scheduled for 11/28/13  Okay to refill until AEX patient requesting status of refill please advise.  (chart in your door)

## 2013-08-06 NOTE — Telephone Encounter (Signed)
[facility], RX for estrace is ready to be picked up and pt has picked up Ambien on 6/19.  Pt notified Estrace ready to be picked up.  Pt is appreciative of follow up call.

## 2013-08-11 ENCOUNTER — Ambulatory Visit (HOSPITAL_COMMUNITY): Payer: BC Managed Care – PPO

## 2013-09-18 ENCOUNTER — Ambulatory Visit (INDEPENDENT_AMBULATORY_CARE_PROVIDER_SITE_OTHER): Payer: BC Managed Care – PPO | Admitting: Family Medicine

## 2013-09-18 ENCOUNTER — Encounter: Payer: Self-pay | Admitting: Family Medicine

## 2013-09-18 VITALS — BP 120/80 | HR 70 | Wt 208.0 lb

## 2013-09-18 DIAGNOSIS — M5380 Other specified dorsopathies, site unspecified: Secondary | ICD-10-CM

## 2013-09-18 DIAGNOSIS — J0100 Acute maxillary sinusitis, unspecified: Secondary | ICD-10-CM

## 2013-09-18 DIAGNOSIS — M62830 Muscle spasm of back: Secondary | ICD-10-CM

## 2013-09-18 MED ORDER — METHOCARBAMOL 500 MG PO TABS: 500.0000 mg | ORAL_TABLET | Freq: Four times a day (QID) | ORAL | Status: DC

## 2013-09-18 MED ORDER — AMOXICILLIN 875 MG PO TABS: 875.0000 mg | ORAL_TABLET | Freq: Two times a day (BID) | ORAL | Status: DC

## 2013-09-18 NOTE — Patient Instructions (Addendum)
Take all the antibiotic and if not totally back to normal when you finish call Proper posturing including for your eyes down to your feet

## 2013-09-18 NOTE — Progress Notes (Signed)
   Subjective:    Patient ID: Crystal Simmons, female    DOB: 03/06/1960, 53 y.o.   MRN: [id number]  HPI She has a two-week history of sore throat but no fever, chills, earache, cough or congestion. She does complain of left maxillary sinus pressure. No upper tooth discomfort. She does not smoke. She also is had some difficulty with right-sided mid back discomfort that she states occurs every several months and is related to working.   Review of Systems     Objective:   Physical Exam alert and in no distress. Tympanic membranes and canals are normal. Throat is clear. Tonsils are normal. Neck is supple without adenopathy or thyromegaly. Cardiac exam shows a regular sinus rhythm without murmurs or gallops. Lungs are clear to auscultation. Nasal mucosa is normal. Slight tenderness to palpation over left maxillary sinus. Back exam does show some slight tenderness and spasm of the right paravertebral muscles in the midthoracic area. Good motion of her back.        Assessment & Plan:  Acute maxillary sinusitis, recurrence not specified - Plan: amoxicillin (AMOXIL) 875 MG tablet  Back spasm - Plan: methocarbamol (ROBAXIN) 500 MG tablet  she will take all the antibiotic and if not totally back to normal when done, she is to call. Also discussed proper back care in regard to ergonomics with her desk, computer and chair. Encouraged her to do stretching exercises on a regular basis.

## 2013-09-26 ENCOUNTER — Other Ambulatory Visit: Payer: Self-pay | Admitting: Medical

## 2013-09-26 ENCOUNTER — Telehealth: Payer: Self-pay | Admitting: Internal Medicine

## 2013-09-26 MED ORDER — CLARITHROMYCIN 500 MG PO TABS: 500.0000 mg | ORAL_TABLET | Freq: Two times a day (BID) | ORAL | Status: DC

## 2013-09-26 NOTE — Telephone Encounter (Signed)
biaxin sent instead.

## 2013-09-26 NOTE — Telephone Encounter (Signed)
Pt states that she came in last week for a sinus infection. However amoxicillin that was given has helped some but she has been nausea every time she has taken it. She would like something else. Send to [facility] [address]

## 2013-10-01 ENCOUNTER — Other Ambulatory Visit: Payer: Self-pay | Admitting: Nurse Practitioner

## 2013-10-01 NOTE — Telephone Encounter (Signed)
Pt needs refill for  Vitamin D, Ergocalciferol, (DRISDOL) 50000 UNITS CAPS capsule  TAKE 1 CAPSULE (50,000 UNITS TOTAL) BY MOUTH ONCE A WEEK., Normal, Last Dose: Not Recorded  Refills: 2 ordered [id number] [place], [place]

## 2013-10-01 NOTE — Telephone Encounter (Signed)
Last AEX 11/25/12 Last refill 05/13/13 #30/2 R Next appt 11/28/13

## 2013-10-02 NOTE — Addendum Note (Signed)
Addended by: Elroy Channel on: 10/02/2013 09:40 AM   Modules accepted: Orders

## 2013-10-02 NOTE — Telephone Encounter (Signed)
Per patients last lab she can take OTC Vitamin D 3 daily which should maintain her level.

## 2013-10-02 NOTE — Telephone Encounter (Signed)
Patient states Ms Chong Sicilian told her she should be taking Rx Vitamin D once a week. Inform pt we have a protocol to follow and since her vit D was normal last time it is best if she takes OTC Vitamin D. - Inform pt Ms Chong Sicilian won't be in the office until next week. Pt states she will wait until Ms Chong Sicilian gets here so we can verify with her if needs to take OTC Vit D.

## 2013-10-05 NOTE — Telephone Encounter (Signed)
I believe she was very low on her Vit D in the past and took a while to bring up - I will check her paper chart and let you know.

## 2013-10-06 ENCOUNTER — Telehealth: Payer: Self-pay | Admitting: Nurse Practitioner

## 2013-10-06 NOTE — Telephone Encounter (Signed)
Her paper chart from 2010 did show a low Vit D level.  But she has been able to keep it up with weekly dose of Vit D since then.  Ask her to try OTC Vit D at 1000 IU daily for 3 months then lets recheck.  If she drops again we will then know if she still needs Rx Vit D.

## 2013-10-06 NOTE — Telephone Encounter (Signed)
LMTCB

## 2013-10-06 NOTE — Telephone Encounter (Signed)
Returning a call to Reina °

## 2013-10-07 NOTE — Telephone Encounter (Signed)
S/W pt. Pt will come 10/16 for her AEX and do a vitamin D check. Pt agreed and voiced understanding

## 2013-10-14 ENCOUNTER — Ambulatory Visit (HOSPITAL_COMMUNITY)
Admission: RE | Admit: 2013-10-14 | Discharge: 2013-10-14 | Disposition: A | Discharge status: Home / Self Care | Payer: BC Managed Care – PPO | Source: Ambulatory Visit | Attending: Nurse Practitioner | Admitting: Nurse Practitioner

## 2013-10-14 DIAGNOSIS — Z1231 Encounter for screening mammogram for malignant neoplasm of breast: Secondary | ICD-10-CM | POA: Diagnosis present

## 2013-10-24 ENCOUNTER — Encounter: Payer: Self-pay | Admitting: Certified Nurse Midwife

## 2013-10-24 ENCOUNTER — Ambulatory Visit (INDEPENDENT_AMBULATORY_CARE_PROVIDER_SITE_OTHER): Payer: BC Managed Care – PPO | Admitting: Certified Nurse Midwife

## 2013-10-24 VITALS — BP 120/76 | HR 72 | Ht 68.5 in | Wt 208.0 lb

## 2013-10-24 DIAGNOSIS — B373 Candidiasis of vulva and vagina: Secondary | ICD-10-CM

## 2013-10-24 DIAGNOSIS — B3731 Acute candidiasis of vulva and vagina: Secondary | ICD-10-CM

## 2013-10-24 DIAGNOSIS — R350 Frequency of micturition: Secondary | ICD-10-CM

## 2013-10-24 MED ORDER — PHENAZOPYRIDINE HCL 200 MG PO TABS: 200.0000 mg | ORAL_TABLET | Freq: Three times a day (TID) | ORAL | Status: DC | PRN

## 2013-10-24 MED ORDER — FLUCONAZOLE 150 MG PO TABS: ORAL_TABLET | ORAL | Status: DC

## 2013-10-24 NOTE — Patient Instructions (Signed)

## 2013-10-24 NOTE — Progress Notes (Signed)
53 y.o. single african [place] g0p0 here with complaint of UTI, with onset  on 6 days. Patient complaining of urinary frequency/urgenc, but has almost stopped now. Patient denies fever, chills, nausea or back pain. No new personal products. Patient feels maybe linked to sexual activity. Denies any vaginal symptoms or new personal products. . Menopausal with some vaginal dryness.   O: Healthy female WDWN Affect: Normal, orientation x 3 Skin : warm and dry CVAT: negative bilateral Abdomen: negative for suprapubic tenderness  Pelvic exam: External genital area: normal, no lesions Bladder,Urethra, Urethral meatus: not tender Vagina: white vaginal discharge, no odor normal appearance  Wet prep taken, ph 4.5 Cervix: normal, non tender Uterus:normal,non tender Adnexa: normal non tender, no fullness or masses  Wet prep positive for yeast Urine; negative  A: Normal pelvic exam Urinary urgency r/o UTI Yeast vaginitis ?Vaginal dryness  P: Reviewed findings of yeast vaginitis and could be causing the urgency.  [id number] see order Discussed no symptoms of UTI and negative urine, but will do culture to make sure no issues. Discussed use of Pyridium which will help with urgency until Diflucan resolves yeast. Start on Cranberry capsule one daily to help with maintaining good urinary health Rx Pyridium see order [id number] culture Reviewed warning signs and symptoms of UTI Encouraged to limit soda, tea, and coffee RV 2 week if TOC needed for positive culture Discussed coconut oil for sexual activity to see if this resolves the issues with discomfort.  RV prn

## 2013-10-25 LAB — URINE CULTURE: Colony Count: 25000

## 2013-11-03 NOTE — Progress Notes (Signed)
Reviewed personally.  M. Suzanne Averill Pons, MD.  

## 2013-11-28 ENCOUNTER — Ambulatory Visit (INDEPENDENT_AMBULATORY_CARE_PROVIDER_SITE_OTHER): Payer: BC Managed Care – PPO | Admitting: Nurse Practitioner

## 2013-11-28 ENCOUNTER — Encounter: Payer: Self-pay | Admitting: Nurse Practitioner

## 2013-11-28 VITALS — BP 116/80 | HR 72 | Ht 68.5 in | Wt 208.0 lb

## 2013-11-28 DIAGNOSIS — Z0000 Encounter for general adult medical examination without abnormal findings: Secondary | ICD-10-CM

## 2013-11-28 DIAGNOSIS — Z113 Encounter for screening for infections with a predominantly sexual mode of transmission: Secondary | ICD-10-CM

## 2013-11-28 DIAGNOSIS — Z01419 Encounter for gynecological examination (general) (routine) without abnormal findings: Secondary | ICD-10-CM

## 2013-11-28 LAB — POCT URINALYSIS DIPSTICK
Bilirubin, UA: NEGATIVE
Blood, UA: NEGATIVE
GLUCOSE UA: NEGATIVE
Ketones, UA: NEGATIVE
Leukocytes, UA: NEGATIVE
NITRITE UA: NEGATIVE
Protein, UA: NEGATIVE
Urobilinogen, UA: NEGATIVE
pH, UA: 6

## 2013-11-28 LAB — HEMOGLOBIN, FINGERSTICK: Hemoglobin, fingerstick: 11.7 g/dL — ABNORMAL LOW (ref 12.0–16.0)

## 2013-11-28 MED ORDER — ESTRADIOL 0.1 MG/GM VA CREA: TOPICAL_CREAM | VAGINAL | Status: DC

## 2013-11-28 NOTE — Progress Notes (Signed)
53 y.o. G0P0 Single African American Fe here for annual exam.  Same partner for 2 years.  No vaso symptoms. Some vaginal dryness.  Using OTC lubrication.  Taking daily cranberry tablets.  Patient's last menstrual period was 10/14/2005.          Sexually active: Yes.    The current method of family planning is none.    Exercising: Yes.    Home exercise routine includes cardio 3 times a week. Smoker:  no  Health Maintenance: Pap:  11/16/11, WNL neg HR HPV MMG:  10/15/13 Bi-Rads Neg Colonoscopy:  09/26/10, polyp, recall 5 years BMD:   never TDaP:  11/25/12 Labs: Hgb: 11.7     UA: neg, ph:6.0   reports that she has never smoked. She has never used smokeless tobacco. She reports that she drinks alcohol. She reports that she does not use illicit drugs.  Past Medical History  Diagnosis Date  . Endometriosis   . 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 Fibroids 10/95  . 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 Insomnia   . Menopausal state 2007  . STD (sexually transmitted disease) 8/09    chlamydia  . Vitamin D deficiency disease 2010    Past Surgical History  Procedure Laterality Date  . Laparoscopy  7/02    multiple surgeries, including LSO, LOA, and endometrioma, and fibroids secondary to endometriosis  . Knee surgery  12/2009    left for meniscal tear  . Colposcopy  10/12    CIN I  . Cervix lesion destruction  4/92  . Umbilical exploration  4/62    for endometrioma   . Breast surgery Left 6/03    Biopsy negative  . Colonoscopy w/ biopsies  09/26/10    polyp benign    Current Outpatient Prescriptions  Medication Sig Dispense Refill  . cetirizine (ZYRTEC) 10 MG tablet Take 10 mg by mouth daily.      Marland Kitchen estradiol (ESTRACE VAGINAL) 0.1 MG/GM vaginal cream Use twice weekly  42.5 g  3  . ferrous sulfate 325 (65 FE) MG tablet Take 325 mg by mouth 2 (two) times a week.      . fish oil-omega-3 fatty acids 1000 MG capsule Take 2 g by mouth daily.      . fluticasone  (FLONASE) 50 MCG/ACT nasal spray Place 2 sprays into the nose daily.      . methocarbamol (ROBAXIN) 500 MG tablet Take 1 tablet (500 mg total) by mouth 4 (four) times daily.  20 tablet  0  . Multiple Vitamins-Minerals (MULTIVITAMIN WITH MINERALS) tablet Take 1 tablet by mouth daily.      . naproxen (NAPROSYN) 500 MG tablet Take 500 mg by mouth 2 (two) times daily with a meal.      . zolpidem (AMBIEN) 10 MG tablet Take 1 tablet (10 mg total) by mouth at bedtime as needed for sleep.  30 tablet  5   No current facility-administered medications for this visit.    Family History  Problem Relation Age of Onset  . Hypertension Mother   . Diabetes Father   . Hypertension Father   . Diabetes Brother   . Diabetes Paternal Grandfather     ROS:  Pertinent items are noted in HPI.  Otherwise, a comprehensive ROS was negative.  Exam:   BP 116/80  Pulse 72  Ht 5' 8.5" (1.74 m)  Wt 208 lb (94.348 kg)  BMI 31.16 kg/m2  LMP 10/14/2005 Height: 5'  8.5" (174 cm)  Ht Readings from Last 3 Encounters:  11/28/13 5' 8.5" (1.74 m)  10/24/13 5' 8.5" (1.74 m)  11/25/12 5' 8.5" (1.74 m)    General appearance: alert, cooperative and appears stated age Head: Normocephalic, without obvious abnormality, atraumatic Neck: no adenopathy, supple, symmetrical, trachea midline and thyroid normal to inspection and palpation Lungs: clear to auscultation bilaterally Breasts: normal appearance, no masses or tenderness Heart: regular rate and rhythm Abdomen: soft, non-tender; no masses,  no organomegaly Extremities: extremities normal, atraumatic, no cyanosis or edema Skin: Skin color, texture, turgor normal. No rashes or lesions Lymph nodes: Cervical, supraclavicular, and axillary nodes normal. No abnormal inguinal nodes palpated Neurologic: Grossly normal   Pelvic: External genitalia:  no lesions              Urethra:  normal appearing urethra with no masses, tenderness or lesions              Bartholin's and  Skene's: normal                 Vagina: normal appearing vagina with normal color and discharge, no lesions              Cervix: anteverted              Pap taken: Yes.   Bimanual Exam:  Uterus:  normal size, contour, position, consistency, mobility, non-tender              Adnexa: no mass, fullness, tenderness               Rectovaginal: Confirms               Anus:  normal sphincter tone, no lesions  A:  Well Woman with normal exam  Postmenopausal   Atrophic vaginitis on Estrace vaginal cream  R/O STD's   Multiple laparotomies due to endometriosis and endometrioma   S/P LSO, LOA 08/2000    Chronic insomnia  P:   Reviewed health and wellness pertinent to exam  Pap smear taken today  Mammogram is due 9/16  Follow with labs - requested STD's  Refill for sleep med's is already in the Epic system  Refill on Estrace vaginal cream for a year  Counseled with risk of CVA, DVT, cancer, etc  Will return for future labs and she will try to establish with a PCP counseled on breast self exam, mammography screening, adequate intake of calcium and vitamin D, diet and exercise, Kegel's exercises return annually or prn  An After Visit Summary was printed and given to the patient.

## 2013-11-28 NOTE — Patient Instructions (Addendum)

## 2013-11-29 LAB — STD PANEL
HEP B S AG: NEGATIVE
HIV 1&2 Ab, 4th Generation: NONREACTIVE

## 2013-11-30 NOTE — Progress Notes (Signed)
Encounter reviewed by Dr. Jhonatan Lomeli Silva.  

## 2013-12-02 ENCOUNTER — Other Ambulatory Visit (INDEPENDENT_AMBULATORY_CARE_PROVIDER_SITE_OTHER): Payer: BC Managed Care – PPO

## 2013-12-02 DIAGNOSIS — Z0000 Encounter for general adult medical examination without abnormal findings: Secondary | ICD-10-CM

## 2013-12-02 LAB — LIPID PANEL
CHOL/HDL RATIO: 2.3 ratio
Cholesterol: 136 mg/dL (ref 0–200)
HDL: 58 mg/dL (ref >39)
LDL CALC: 66 mg/dL (ref 0–99)
TRIGLYCERIDES: 62 mg/dL (ref <150)
VLDL: 12 mg/dL (ref 0–40)

## 2013-12-02 LAB — COMPREHENSIVE METABOLIC PANEL
ALK PHOS: 96 U/L (ref 39–117)
ALT: 11 U/L (ref 0–35)
AST: 15 U/L (ref 0–37)
Albumin: 3.9 g/dL (ref 3.5–5.2)
BUN: 9 mg/dL (ref 6–23)
CALCIUM: 9.5 mg/dL (ref 8.4–10.5)
CO2: 26 mEq/L (ref 19–32)
CREATININE: 0.79 mg/dL (ref 0.50–1.10)
Chloride: 106 mEq/L (ref 96–112)
Glucose, Bld: 99 mg/dL (ref 70–99)
Potassium: 4.4 mEq/L (ref 3.5–5.3)
Sodium: 142 mEq/L (ref 135–145)
Total Bilirubin: 0.6 mg/dL (ref 0.2–1.2)
Total Protein: 7 g/dL (ref 6.0–8.3)

## 2013-12-02 LAB — IPS N GONORRHOEA AND CHLAMYDIA BY PCR

## 2013-12-02 LAB — TSH: TSH: 2.121 u[IU]/mL (ref 0.350–4.500)

## 2013-12-02 LAB — IPS PAP TEST WITH REFLEX TO HPV

## 2013-12-03 LAB — VITAMIN D 25 HYDROXY (VIT D DEFICIENCY, FRACTURES): Vit D, 25-Hydroxy: 45 ng/mL (ref 30–89)

## 2014-01-05 ENCOUNTER — Other Ambulatory Visit: Payer: Self-pay | Admitting: Family Medicine

## 2014-01-06 NOTE — Telephone Encounter (Signed)
Dr.Lalonde is this okay 

## 2014-01-22 ENCOUNTER — Encounter: Payer: Self-pay | Admitting: Family Medicine

## 2014-01-22 ENCOUNTER — Ambulatory Visit (INDEPENDENT_AMBULATORY_CARE_PROVIDER_SITE_OTHER): Payer: BC Managed Care – PPO | Admitting: Family Medicine

## 2014-01-22 VITALS — BP 120/70 | Wt 207.0 lb

## 2014-01-22 DIAGNOSIS — M7521 Bicipital tendinitis, right shoulder: Secondary | ICD-10-CM

## 2014-01-22 NOTE — Patient Instructions (Signed)
Heat 20 minutes three times per day. Naprosyn twice per day

## 2014-01-22 NOTE — Progress Notes (Signed)
   Subjective:    Patient ID: Crystal Simmons, female    DOB: 11-02-1960, 53 y.o.   MRN: [id number]  HPI She is here for evaluation of a three-week history of right shoulder pain. 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 She cannot abduct and has had difficulty with sleeping on that right side since then. She has been using 800 mg of Advil 3 times per day without success. She has remote history of intermittent difficulty with right shoulder discomfort but has had no previous evaluation.   Review of Systems     Objective:   Physical Exam Pain on abduction of the shoulder. Passive abduction was slightly easier. Tender to palpation over the bicipital groove with stretching of the biceps tendon causing pain. Neer's and Hawkins test negative. No joint laxity is noted.       Assessment & Plan:  Bicipital tendinitis of right shoulder - Plan: Ambulatory referral to Physical Therapy  recommend heat, Naprosyn 500 twice a day and referral to physical therapy for iontophoresis. Discussed the possibility of injection but will wait to see how physical therapy works. She is comfortable with this approach.

## 2014-03-04 ENCOUNTER — Other Ambulatory Visit: Payer: Self-pay | Admitting: Nurse Practitioner

## 2014-03-04 NOTE — Telephone Encounter (Signed)
Medication refill request: Zolpidem Tartrate 10 mg  Last AEX:  11/28/13 with Ms. Patty Next AEX: NO AEX scheduled but due in 11/2014 Last MMG (if hormonal medication request): N/A Refill authorized: #30/5 rfs, please advise.

## 2014-03-05 NOTE — Telephone Encounter (Signed)
RX printed, signed by Ms. Patty and [facility] to [facility].

## 2014-04-28 ENCOUNTER — Ambulatory Visit (INDEPENDENT_AMBULATORY_CARE_PROVIDER_SITE_OTHER): Payer: BLUE CROSS/BLUE SHIELD | Admitting: Family Medicine

## 2014-04-28 ENCOUNTER — Encounter: Payer: Self-pay | Admitting: Family Medicine

## 2014-04-28 VITALS — BP 120/80 | HR 110 | Temp 100.1°F | Wt 198.0 lb

## 2014-04-28 DIAGNOSIS — J301 Allergic rhinitis due to pollen: Secondary | ICD-10-CM | POA: Diagnosis not present

## 2014-04-28 DIAGNOSIS — J069 Acute upper respiratory infection, unspecified: Secondary | ICD-10-CM

## 2014-04-28 MED ORDER — BENZONATATE 100 MG PO CAPS: 200.0000 mg | ORAL_CAPSULE | Freq: Three times a day (TID) | ORAL | Status: DC | PRN

## 2014-04-28 NOTE — Progress Notes (Signed)
   Subjective:    Patient ID: Crystal Simmons, female    DOB: [birth year], 54 y.o.   MRN: [id number]  HPI She is here for a 3 day history of upper respiratory symptoms that began with headache, sinus pressure and dry cough. She reports worsening cough productive of clear sputum, PND, and sore throat. Cough is keeping her from sleeping at night. Denies ear pain, rhinorrhea shortness of breath. She has seasonal allergies and takes Zyrtec and Nasonex daily. She does not smoke   Review of Systems  All other systems reviewed and are negative.      Objective:   Physical Exam Alert and in no distress. Tympanic membranes and canals are normal. Nasal mucosa normal. Throat is clear with mild erythematous. Neck is supple without adenopathy. Cardiac exam shows a regular rate and rhythm. Lungs are clear to auscultation.         Assessment & Plan:  Allergic rhinitis due to pollen  Acute URI - Plan: benzonatate (TESSALON) 100 MG capsule  Most likely her symptoms are due to a virus. Take Tessalon pearls to help with cough at bedtime. Try switching from Zyrtec to Allegra or Claritin and see if this helps with allergy symptoms. Call if not better in about a week.

## 2014-04-30 ENCOUNTER — Telehealth: Payer: Self-pay | Admitting: Family Medicine

## 2014-04-30 NOTE — Telephone Encounter (Signed)
Patient informed and verbalized understanding

## 2014-04-30 NOTE — Telephone Encounter (Signed)
Pt states you told her to call if she isn't any better and she is not, drainage now yellow, wants antibiotic called [facility] [place]

## 2014-04-30 NOTE — Telephone Encounter (Signed)
I told her to give it a week. Have her wait through the weekend and see if she gets better on her own

## 2014-07-29 ENCOUNTER — Other Ambulatory Visit: Payer: Self-pay

## 2014-07-29 ENCOUNTER — Other Ambulatory Visit: Payer: Self-pay | Admitting: Family Medicine

## 2014-07-29 NOTE — Telephone Encounter (Signed)
Is this okay?

## 2014-09-08 ENCOUNTER — Other Ambulatory Visit: Payer: Self-pay | Admitting: Nurse Practitioner

## 2014-09-08 NOTE — Telephone Encounter (Signed)
Medication refill request: Ambien  Last AEX:  11/28/13 PG Next AEX: Not Scheduled  Last MMG (if hormonal medication request): 10/15/13  BIRADS1:neg Refill authorized: 03/05/14 #[id number] / 5R. Today please advise.

## 2014-09-09 NOTE — Telephone Encounter (Signed)
Rx faxed by Margaretha Sheffield today.

## 2014-12-02 ENCOUNTER — Encounter: Payer: Self-pay | Admitting: Nurse Practitioner

## 2014-12-02 ENCOUNTER — Ambulatory Visit (INDEPENDENT_AMBULATORY_CARE_PROVIDER_SITE_OTHER): Payer: BLUE CROSS/BLUE SHIELD | Admitting: Nurse Practitioner

## 2014-12-02 VITALS — BP 116/72 | HR 60 | Ht 68.25 in | Wt 196.0 lb

## 2014-12-02 DIAGNOSIS — E559 Vitamin D deficiency, unspecified: Secondary | ICD-10-CM

## 2014-12-02 DIAGNOSIS — Z113 Encounter for screening for infections with a predominantly sexual mode of transmission: Secondary | ICD-10-CM | POA: Diagnosis not present

## 2014-12-02 DIAGNOSIS — Z0000 Encounter for general adult medical examination without abnormal findings: Secondary | ICD-10-CM | POA: Diagnosis not present

## 2014-12-02 DIAGNOSIS — Z01419 Encounter for gynecological examination (general) (routine) without abnormal findings: Secondary | ICD-10-CM | POA: Diagnosis not present

## 2014-12-02 LAB — HEMOGLOBIN, FINGERSTICK: HEMOGLOBIN, FINGERSTICK: 12.2 g/dL (ref 12.0–16.0)

## 2014-12-02 LAB — POCT URINALYSIS DIPSTICK
Bilirubin, UA: NEGATIVE
GLUCOSE UA: NEGATIVE
Ketones, UA: NEGATIVE
Leukocytes, UA: NEGATIVE
NITRITE UA: NEGATIVE
PROTEIN UA: NEGATIVE
RBC UA: NEGATIVE
UROBILINOGEN UA: NEGATIVE
pH, UA: 8

## 2014-12-02 LAB — STD PANEL
HEP B S AG: NEGATIVE
HIV 1&2 Ab, 4th Generation: NONREACTIVE

## 2014-12-02 MED ORDER — PHENAZOPYRIDINE HCL 200 MG PO TABS: 200.0000 mg | ORAL_TABLET | Freq: Three times a day (TID) | ORAL | Status: DC | PRN

## 2014-12-02 MED ORDER — ZOLPIDEM TARTRATE 10 MG PO TABS: ORAL_TABLET | ORAL | Status: DC

## 2014-12-02 NOTE — Patient Instructions (Signed)

## 2014-12-02 NOTE — Progress Notes (Signed)
Patient ID: Crystal Simmons, female   DOB: February 23, 1960, 54 y.o.   MRN: [id number] 54 y.o. G0P0 Single  African American Fe here for annual exam. Same partner for 3 years.  No vaso symptoms.  Feels well.  Needs a refill on Ambien and Pyridium which she takes for urethritis symptoms.  Patient's last menstrual period was 10/14/2005 (approximate).          Sexually active: Yes.    The current method of family planning is status post hysterectomy.    Exercising: Yes.    walking and elliptical 3-4 times per week, walking everyday when weather is nice Smoker:  no  Health Maintenance: Pap: 11/28/13, Negative MMG: 10/14/13, Bi-Rads 1: Negative Colonoscopy: 09/26/10, polyp, recall 5 years aware to get done next year TDaP: 11/25/12 Labs: HB: 12.2   Urine: Negative    reports that she has never smoked. She has never used smokeless tobacco. She reports that she drinks alcohol. She reports that she does not use illicit drugs.  Past Medical History  Diagnosis Date  . Endometriosis   . 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 Fibroids 10/95  . 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 Insomnia   . Menopausal state 2007  . STD (sexually transmitted disease) 8/09    chlamydia  . Vitamin D deficiency disease 2010    Past Surgical History  Procedure Laterality Date  . Laparoscopy  7/02    multiple surgeries, including LSO, LOA, and endometrioma, and fibroids secondary to endometriosis  . Knee surgery  12/2009    left for meniscal tear  . Colposcopy  10/12    CIN I  . Cervix lesion destruction  4/92  . Umbilical exploration  0/08    for endometrioma   . Breast surgery Left 6/03    Biopsy negative  . Colonoscopy w/ biopsies  09/26/10    polyp benign    Current Outpatient Prescriptions  Medication Sig Dispense Refill  . cetirizine (ZYRTEC) 10 MG tablet Take 10 mg by mouth daily.    Marland Kitchen estradiol (ESTRACE VAGINAL) 0.1 MG/GM vaginal cream Use twice weekly 42.5 g 3  . fish oil-omega-3 fatty acids  1000 MG capsule Take 2 g by mouth daily.    . fluticasone (FLONASE) 50 MCG/ACT nasal spray Place 2 sprays into the nose daily.    Marland Kitchen loratadine (CLARITIN) 10 MG tablet Take 10 mg by mouth daily.    . methocarbamol (ROBAXIN) 500 MG tablet TAKE 1 TABLET BY MOUTH 4 TIMES A DAY 20 tablet 0  . Multiple Vitamins-Minerals (MULTIVITAMIN WITH MINERALS) tablet Take 1 tablet by mouth daily.    . phenazopyridine (PYRIDIUM) 200 MG tablet Take 1 tablet (200 mg total) by mouth 3 (three) times daily as needed for pain. 30 tablet 5  . phentermine (ADIPEX-P) 37.5 MG tablet Take 37.5 mg by mouth daily.  1  . zolpidem (AMBIEN) 10 MG tablet TAKE 1 TABLET BY MOUTH EVERY DAY AT BEDTIME FOR SLEEP 30 tablet 5   No current facility-administered medications for this visit.    Family History  Problem Relation Age of Onset  . Hypertension Mother   . Diabetes Father   . Hypertension Father   . Diabetes Brother   . Diabetes Paternal Grandfather     ROS:  Pertinent items are noted in HPI.  Otherwise, a comprehensive ROS was negative.  Exam:   BP 116/72 mmHg  Pulse 60  Ht 5' 8.25" (1.734 m)  Wt 196  lb (88.905 kg)  BMI 29.57 kg/m2  LMP 10/14/2005 (Approximate) Height: 5' 8.25" (173.4 cm) Ht Readings from Last 3 Encounters:  12/02/14 5' 8.25" (1.734 m)  11/28/13 5' 8.5" (1.74 m)  10/24/13 5' 8.5" (1.74 m)    General appearance: alert, cooperative and appears stated age Head: Normocephalic, without obvious abnormality, atraumatic Neck: no adenopathy, supple, symmetrical, trachea midline and thyroid normal to inspection and palpation Lungs: clear to auscultation bilaterally Breasts: normal appearance, no masses or tenderness Heart: regular rate and rhythm Abdomen: soft, non-tender; no masses,  no organomegaly Extremities: extremities normal, atraumatic, no cyanosis or edema Skin: Skin color, texture, turgor normal. No rashes or lesions Lymph nodes: Cervical, supraclavicular, and axillary nodes normal. No  abnormal inguinal nodes palpated Neurologic: Grossly normal   Pelvic: External genitalia:  no lesions              Urethra:  normal appearing urethra with no masses, tenderness or lesions              Bartholin's and Skene's: normal                 Vagina: normal appearing vagina with normal color and discharge, no lesions              Cervix: anteverted              Pap taken: No. Bimanual Exam:  Uterus:  normal size, contour, position, consistency, mobility, non-tender              Adnexa: no mass, fullness, tenderness               Rectovaginal: Confirms               Anus:  normal sphincter tone, no lesions  Chaperone present: no  A:  Well Woman with normal exam  Postmenopausal  Atrophic vaginitis on Estrace vaginal cream R/O STD's  Multiple laparotomies due to endometriosis and endometrioma  S/P LSO, LOA 08/2000  Chronic insomnia   P:   Reviewed health and wellness pertinent to exam  Pap smear as above  Mammogram is due now and will schedule  Does not need a refill on Estrace vaginal cream now and will call.   She can have a refill after Mammo is done.  Counseled with risk of CVA, DVT, cancer, etc.  Refill on Ambien and Pyridium to use prn  Counseled on breast self exam, mammography screening, STD prevention, use and side effects of HRT, adequate intake of calcium and vitamin D, diet and exercise, Kegel's exercises return annually or prn  An After Visit Summary was printed and given to the patient.

## 2014-12-03 LAB — VITAMIN D 25 HYDROXY (VIT D DEFICIENCY, FRACTURES): VIT D 25 HYDROXY: 31 ng/mL (ref 30–100)

## 2014-12-04 LAB — IPS N GONORRHOEA AND CHLAMYDIA BY PCR

## 2014-12-04 NOTE — Progress Notes (Signed)
Encounter reviewed by Dr. Aundria Rud. May need evaluation for urethritis.

## 2014-12-11 ENCOUNTER — Encounter: Payer: Self-pay | Admitting: Family Medicine

## 2014-12-11 ENCOUNTER — Ambulatory Visit: Payer: BLUE CROSS/BLUE SHIELD | Admitting: Family Medicine

## 2014-12-11 ENCOUNTER — Ambulatory Visit (INDEPENDENT_AMBULATORY_CARE_PROVIDER_SITE_OTHER): Payer: BLUE CROSS/BLUE SHIELD | Admitting: Family Medicine

## 2014-12-11 VITALS — BP 120/78 | HR 64 | Temp 98.3°F | Wt 200.0 lb

## 2014-12-11 DIAGNOSIS — J019 Acute sinusitis, unspecified: Secondary | ICD-10-CM

## 2014-12-11 MED ORDER — AMOXICILLIN 875 MG PO TABS: 875.0000 mg | ORAL_TABLET | Freq: Two times a day (BID) | ORAL | Status: DC

## 2014-12-11 NOTE — Progress Notes (Signed)
   Subjective:    Patient ID: Crystal Simmons, female    DOB: 19-Jul-1960, 54 y.o.   MRN: [id number]  HPI She is here for a 9 day history of sinus pressure, headache, facial pain and scratchy throat. She states she is getting worse.  Denies fever, chills, body aches, sore throat, ear ache, cough.  Does not smoke, sick contacts at work. Underlying allergies. She has been taking Flonase. No recent antibiotic use.   Reviewed allergies, medications, past medical and social history.   Review of Systems Pertinent positives and negatives in the history of present illness.    Objective:   Physical Exam  Alert and in no distress. Marked maxillary and ethmoid sinus tenderness Tympanic membranes and canals are normal with cerumen. Pharyngeal area is normal. Neck is supple without adenopathy. Cardiac exam shows a regular sinus rhythm without murmurs or gallops. Lungs are clear to auscultation.       Assessment & Plan:  Acute rhinosinusitis  Recommend symptomatic treatment and she will continue taking her allergy medication. She will let me know if she is not back to her baseline after finishing the antibiotic or if she gets worse.

## 2014-12-11 NOTE — Patient Instructions (Signed)

## 2014-12-22 ENCOUNTER — Telehealth: Payer: Self-pay | Admitting: Nurse Practitioner

## 2014-12-22 NOTE — Telephone Encounter (Signed)
Patient called and given information after consult with Dr. Quincy Simmonds about her urethritis symptoms.  This has caused a negative urine culture despite the symptoms.  She does feel some better with chronic use of Pyridium.  She is informed that we should do an Affirm test to rule out vaginal origin.  But should treat this as possible Mycoplasma in the urethra and give her Azithromycin 1 gm treatment.  She is happy to return and get this symptoms treated.  She is scheduled by Alinda Sierras.

## 2014-12-22 NOTE — Telephone Encounter (Signed)
Pt is scheduled for 12/25/14 with Edman Circle, FNP.

## 2014-12-24 ENCOUNTER — Other Ambulatory Visit: Payer: Self-pay

## 2014-12-24 ENCOUNTER — Telehealth: Payer: Self-pay | Admitting: Nurse Practitioner

## 2014-12-24 DIAGNOSIS — Z1231 Encounter for screening mammogram for malignant neoplasm of breast: Secondary | ICD-10-CM

## 2014-12-24 NOTE — Telephone Encounter (Signed)
Made in error

## 2014-12-25 ENCOUNTER — Ambulatory Visit: Payer: BLUE CROSS/BLUE SHIELD | Admitting: Nurse Practitioner

## 2015-01-01 ENCOUNTER — Ambulatory Visit
Admission: RE | Admit: 2015-01-01 | Discharge: 2015-01-01 | Disposition: A | Discharge status: Home / Self Care | Payer: BLUE CROSS/BLUE SHIELD | Source: Ambulatory Visit

## 2015-01-01 DIAGNOSIS — Z1231 Encounter for screening mammogram for malignant neoplasm of breast: Secondary | ICD-10-CM

## 2015-01-15 ENCOUNTER — Ambulatory Visit: Payer: BLUE CROSS/BLUE SHIELD | Admitting: Nurse Practitioner

## 2015-01-29 ENCOUNTER — Ambulatory Visit: Payer: BLUE CROSS/BLUE SHIELD | Admitting: Nurse Practitioner

## 2015-01-29 ENCOUNTER — Ambulatory Visit (INDEPENDENT_AMBULATORY_CARE_PROVIDER_SITE_OTHER): Payer: BLUE CROSS/BLUE SHIELD | Admitting: Nurse Practitioner

## 2015-01-29 ENCOUNTER — Ambulatory Visit: Payer: Self-pay

## 2015-01-29 ENCOUNTER — Encounter: Payer: Self-pay | Admitting: Nurse Practitioner

## 2015-01-29 VITALS — BP 122/76 | HR 76 | Ht 68.25 in | Wt 199.0 lb

## 2015-01-29 DIAGNOSIS — N760 Acute vaginitis: Secondary | ICD-10-CM

## 2015-01-29 NOTE — Progress Notes (Signed)
54 y.o. Single African American female G0P0 here with complaint of recent vaginal symptoms of itching following a course of Amoxil for URI.  She has already treated this with OTC Monistat and feels better.  She is here primarily for our evaluation of her urethritis symptoms.  She uses Pyridium 200 mg TID prn - mostly after SA as prn.  Dr. Quincy Simmonds thought would be a good idea to evaluate her for BV as this may be the cause of her symptoms.  She has no urinary symptoms now - no urgency, frequency, or dysuria. Same partner for 3 years and STD's done then were all negative. Father is 32 and in a hospital in [place].   O:  Healthy female WDWN Affect: normal, orientation x 3  Exam: alert and in no distress Abdomen: soft and non tender Lymph node: no enlargement or tenderness Pelvic exam: External genital: normal female BUS: negative Vagina: no discharge noted.  Affirm taken. No urethral tenderness or pressure    A: R/O Vaginitis as cause of urethritis   P: Discussed findings of vaginitis and etiology. Discussed Aveeno or baking soda sitz bath for comfort. Avoid moist clothes or pads for extended period of time. If working out in gym clothes or swim suits for long periods of time change underwear or bottoms of swimsuit if possible. Olive Oil/Coconut Oil use for skin protection prior to activity can be used to external skin.  Rx: no med's indicated at this time  Follow with Affirm  RV prn

## 2015-01-29 NOTE — Patient Instructions (Signed)

## 2015-01-30 LAB — WET PREP BY MOLECULAR PROBE
CANDIDA SPECIES: NEGATIVE
GARDNERELLA VAGINALIS: NEGATIVE
TRICHOMONAS VAG: NEGATIVE

## 2015-02-01 NOTE — Progress Notes (Signed)
Encounter reviewed Jill Jertson, MD   

## 2015-03-14 ENCOUNTER — Other Ambulatory Visit: Payer: Self-pay | Admitting: Nurse Practitioner

## 2015-03-17 ENCOUNTER — Other Ambulatory Visit: Payer: Self-pay | Admitting: Nurse Practitioner

## 2015-03-18 NOTE — Telephone Encounter (Signed)
Medication refill request: Ambien  Last AEX:  12-02-14  Next AEX: 12-03-15 Last MMG (if hormonal medication request): 01-04-15 WNL Refill authorized: please advise

## 2015-05-12 ENCOUNTER — Encounter: Payer: Self-pay | Admitting: Family Medicine

## 2015-05-12 ENCOUNTER — Ambulatory Visit (INDEPENDENT_AMBULATORY_CARE_PROVIDER_SITE_OTHER): Payer: BLUE CROSS/BLUE SHIELD | Admitting: Family Medicine

## 2015-05-12 VITALS — BP 122/76 | HR 83 | Temp 99.8°F | Resp 20 | Wt 203.4 lb

## 2015-05-12 DIAGNOSIS — R6889 Other general symptoms and signs: Secondary | ICD-10-CM

## 2015-05-12 DIAGNOSIS — J0140 Acute pansinusitis, unspecified: Secondary | ICD-10-CM

## 2015-05-12 DIAGNOSIS — J029 Acute pharyngitis, unspecified: Secondary | ICD-10-CM | POA: Diagnosis not present

## 2015-05-12 DIAGNOSIS — H6123 Impacted cerumen, bilateral: Secondary | ICD-10-CM

## 2015-05-12 LAB — POC INFLUENZA A&B (BINAX/QUICKVUE)
Influenza A, POC: NEGATIVE
Influenza B, POC: NEGATIVE

## 2015-05-12 LAB — POCT RAPID STREP A (OFFICE): Rapid Strep A Screen: NEGATIVE

## 2015-05-12 NOTE — Progress Notes (Signed)
Subjective:  Crystal Simmons is a 54 y.o. female who presents for possible sinus infection.  Symptoms include nasal congestion, sneezing, cough with occasional phlegm, nasal discharge, sinus pressure to maxillary sinuses for approximately 1 week. Also reports left ear pressure.  Denies nausea, vomiting, diarrhea.  Reports having underlying allergies that seem to be flaring up also. She is taking daily Flonase and Zyrtec. States she has been on Zyrtec for quite some time and is not sure it is still working for her.  Past history is significant for bronchitis, denies pneumonia history. Patient is a non-smoker.  Using Tylenol, Ibuprofen, Alka selter for symptoms.  Denies sick contacts.  No other aggravating or relieving factors.  No other c/o.  ROS as in subjective   Objective: Filed Vitals:   05/12/15 1338  BP: 122/76  Pulse: 83  Temp: 99.8 F (37.7 C)  Resp: 20    General appearance: Alert, WD/WN, no distress                             Skin: warm, no rash                           Head: + maxillary sinus tenderness L>R,                            Eyes: conjunctiva normal, corneas clear, PERRLA, clear drainage                            Ears: Cannot visualize TMs due to cerumen bilaterally, external ear canals normal. Reevaluation after ear lavage reveals normal TMs.                           Nose: septum midline, turbinates swollen, with erythema that are deep red and clear discharge             Mouth/throat: MMM, tongue normal, mild pharyngeal erythema                           Neck: supple, no adenopathy, no thyromegaly, nontender                          Heart: RRR, normal S1, S2, no murmurs                         Lungs: CTA bilaterally, no wheezes, rales, or rhonchi      Assessment and Plan:  Acute pansinusitis, recurrence not specified  Flu-like symptoms - Plan: POC Influenza A&B(BINAX/QUICKVUE)  Cerumen impaction, bilateral  Acute pharyngitis, unspecified etiology - Plan:  POCT rapid strep A   Flu swab: Negative Strep swab: Negative  Prescription given for Amoxil.  Suspect that her symptoms are related to acute sinusitis and that her allergies are contributing to her symptoms. Ear lavage performed bilaterally for cerumen impaction. Patient tolerated procedure well. Can use OTC Mucinex for congestion or Delsym for cough.  Tylenol or Ibuprofen OTC for fever and malaise.  Discussed symptomatic relief, nasal saline flush, and call if not back to normal after completing the antibiotic. She may consider switching from Zyrtec to Allegra to see if she notices improvement with allergies.

## 2015-05-12 NOTE — Patient Instructions (Addendum)
Continue treating your underlying allergies with Flonase. You may want to try short course of Allegra instead of Zyrtec and see if you notice any improvement in your symptoms. If you are not back to baseline after completing the antibiotic, call me. Stay well hydrated and treat your symptoms. Tylenol or ibuprofen for fever and pain. Saline nasal spray. Delsym or Mucinex DM for cough congestion.  Sinusitis, Adult Sinusitis is redness, soreness, and inflammation of the paranasal sinuses. Paranasal sinuses are air pockets within the bones of your face. They are located beneath your eyes, in the middle of your forehead, and above your eyes. In healthy paranasal sinuses, mucus is able to drain out, and air is able to circulate through them by way of your nose. However, when your paranasal sinuses are inflamed, mucus and air can become trapped. This can allow bacteria and other germs to grow and cause infection. Sinusitis can develop quickly and last only a short time (acute) or continue over a long period (chronic). Sinusitis that lasts for more than 12 weeks is considered chronic. CAUSES Causes of sinusitis include:  Allergies.  Structural abnormalities, such as displacement of the cartilage that separates your nostrils (deviated septum), which can decrease the air flow through your nose and sinuses and affect sinus drainage.  Functional abnormalities, such as when the small hairs (cilia) that line your sinuses and help remove mucus do not work properly or are not present. SIGNS AND SYMPTOMS Symptoms of acute and chronic sinusitis are the same. The primary symptoms are pain and pressure around the affected sinuses. Other symptoms include:  Upper toothache.  Earache.  Headache.  Bad breath.  Decreased sense of smell and taste.  A cough, which worsens when you are lying flat.  Fatigue.  Fever.  Thick drainage from your nose, which often is green and may contain pus  (purulent).  Swelling and warmth over the affected sinuses. DIAGNOSIS Your health care provider will perform a physical exam. During your exam, your health care provider may perform any of the following to help determine if you have acute sinusitis or chronic sinusitis:  Look in your nose for signs of abnormal growths in your nostrils (nasal polyps).  Tap over the affected sinus to check for signs of infection.  View the inside of your sinuses using an imaging device that has a light attached (endoscope). If your health care provider suspects that you have chronic sinusitis, one or more of the following tests may be recommended:  Allergy tests.  Nasal culture. A sample of mucus is taken from your nose, sent to a lab, and screened for bacteria.  Nasal cytology. A sample of mucus is taken from your nose and examined by your health care provider to determine if your sinusitis is related to an allergy. TREATMENT Most cases of acute sinusitis are related to a viral infection and will resolve on their own within 10 days. Sometimes, medicines are prescribed to help relieve symptoms of both acute and chronic sinusitis. These may include pain medicines, decongestants, nasal steroid sprays, or saline sprays. However, for sinusitis related to a bacterial infection, your health care provider will prescribe antibiotic medicines. These are medicines that will help kill the bacteria causing the infection. Rarely, sinusitis is caused by a fungal infection. In these cases, your health care provider will prescribe antifungal medicine. For some cases of chronic sinusitis, surgery is needed. Generally, these are cases in which sinusitis recurs more than 3 times per year, despite other treatments. HOME CARE  INSTRUCTIONS  Drink plenty of water. Water helps thin the mucus so your sinuses can drain more easily.  Use a humidifier.  Inhale steam 3-4 times a day (for example, sit in the bathroom with the shower  running).  Apply a warm, moist washcloth to your face 3-4 times a day, or as directed by your health care provider.  Use saline nasal sprays to help moisten and clean your sinuses.  Take medicines only as directed by your health care provider.  If you were prescribed either an antibiotic or antifungal medicine, finish it all even if you start to feel better. SEEK IMMEDIATE MEDICAL CARE IF:  You have increasing pain or severe headaches.  You have nausea, vomiting, or drowsiness.  You have swelling around your face.  You have vision problems.  You have a stiff neck.  You have difficulty breathing.   This information is not intended to replace advice given to you by your health care provider. Make sure you discuss any questions you have with your health care provider.   Document Released: 01/30/2005 Document Revised: 02/20/2014 Document Reviewed: 02/14/2011 Elsevier Interactive Patient Education [employer].

## 2015-05-13 ENCOUNTER — Other Ambulatory Visit: Payer: Self-pay | Admitting: Family Medicine

## 2015-05-13 ENCOUNTER — Telehealth: Payer: Self-pay | Admitting: Family Medicine

## 2015-05-13 MED ORDER — FLUCONAZOLE 150 MG PO TABS: 150.0000 mg | ORAL_TABLET | Freq: Once | ORAL | Status: DC

## 2015-05-13 MED ORDER — AMOXICILLIN 875 MG PO TABS: 875.0000 mg | ORAL_TABLET | Freq: Two times a day (BID) | ORAL | Status: DC

## 2015-05-13 NOTE — Telephone Encounter (Signed)
Pt called and states that 2 rx were not at pharmacy. Please send in amoxcillin and diflucan to pharmacy

## 2015-05-13 NOTE — Telephone Encounter (Signed)
Both prescriptions called to pharmacy

## 2015-05-17 ENCOUNTER — Telehealth: Payer: Self-pay | Admitting: Family Medicine

## 2015-05-17 MED ORDER — CIPROFLOXACIN HCL 500 MG PO TABS: 500.0000 mg | ORAL_TABLET | Freq: Two times a day (BID) | ORAL | Status: DC

## 2015-05-17 NOTE — Telephone Encounter (Signed)
Let her know that I called a different medication in 

## 2015-05-17 NOTE — Telephone Encounter (Signed)
Pt states amoxicillin causing nausea and diarrhea and wants to know if you can switch her to something else.  Said even tried cutting in half and taking but still caused diarrhea.  Can you switch to something else?  Please let pt know

## 2015-05-17 NOTE — Telephone Encounter (Signed)
Pt is aware.  

## 2015-05-21 ENCOUNTER — Telehealth: Payer: Self-pay | Admitting: Family Medicine

## 2015-05-21 MED ORDER — FLUCONAZOLE 150 MG PO TABS: 150.0000 mg | ORAL_TABLET | Freq: Once | ORAL | Status: DC

## 2015-05-21 MED ORDER — BENZONATATE 200 MG PO CAPS: 200.0000 mg | ORAL_CAPSULE | Freq: Two times a day (BID) | ORAL | Status: DC | PRN

## 2015-05-21 NOTE — Telephone Encounter (Signed)
Pt called and stated that she is still coughing. She states she is coughing up stuff. She is requesting that something be sent in for her. She also states that she was given diflucan, but still having issues and would like another round of that. Pt uses [facility] on [address] and can be reached at [phone].

## 2015-05-21 NOTE — Telephone Encounter (Signed)
Pt has persistent cough so vickie said i could call in tessalon perles for pt for cough. Sent diflucan in for pt as well

## 2015-05-21 NOTE — Telephone Encounter (Signed)
Please call and find out if she has fever, is her cough worse or just persistent. Did her other symptoms clear up? Please find out more information. She may need to go for a chest XR if she is having fever or worsening productive cough. I am ok refilling Diflucan.

## 2015-10-13 ENCOUNTER — Other Ambulatory Visit: Payer: Self-pay | Admitting: Nurse Practitioner

## 2015-10-14 NOTE — Telephone Encounter (Signed)
Medication refill request: Zolpidem Last AEX:  12/02/14 PG Next AEX: 12/13/15 PG Last MMG (if hormonal medication request): 01/01/15 [id number] Refill authorized: 03/19/15 #[id number]. Please advise. Thank you.

## 2015-10-15 NOTE — Telephone Encounter (Signed)
Faxed prescription for [facility]pidem to [facility] on 10/15/15. -[place]

## 2015-11-17 ENCOUNTER — Encounter: Payer: Self-pay | Admitting: Nurse Practitioner

## 2015-11-17 ENCOUNTER — Ambulatory Visit (INDEPENDENT_AMBULATORY_CARE_PROVIDER_SITE_OTHER): Payer: BLUE CROSS/BLUE SHIELD | Admitting: Nurse Practitioner

## 2015-11-17 VITALS — BP 122/76 | HR 64 | Temp 98.7°F | Ht 68.25 in | Wt 207.0 lb

## 2015-11-17 DIAGNOSIS — R300 Dysuria: Secondary | ICD-10-CM

## 2015-11-17 LAB — POCT URINALYSIS DIPSTICK
Bilirubin, UA: NEGATIVE
Glucose, UA: NEGATIVE
Ketones, UA: NEGATIVE
NITRITE UA: POSITIVE
PROTEIN UA: NEGATIVE
UROBILINOGEN UA: NEGATIVE
pH, UA: 8

## 2015-11-17 MED ORDER — FLUCONAZOLE 150 MG PO TABS: 150.0000 mg | ORAL_TABLET | Freq: Once | ORAL | 0 refills | Status: AC

## 2015-11-17 MED ORDER — NITROFURANTOIN MONOHYD MACRO 100 MG PO CAPS: 100.0000 mg | ORAL_CAPSULE | Freq: Two times a day (BID) | ORAL | 0 refills | Status: DC

## 2015-11-17 NOTE — Progress Notes (Signed)
55 y.o.Single African American female G0P0000 here with complaint of UTI, with onset of dysuria on Saturday following an episode of diarrhea.  . Patient complaining of:  dysuria, urinary frequency, urinary urgency and low pelvic pressure. Patient denies fever, chills, nausea or back pain. No new personal products. Patient feels not related to sexual activity. Denies vaginal symptoms.    Contraception is post hysterectomy & Menopausal.  No change in partner - same partner for 3 + yrs.  Menopausal with vaginal dryness. Patient has adequate water intake.  Has taken Pyridium 200 mg yesterday with relief.   O: Healthy female WDWN Affect: Normal, orientation x 3 Skin : warm and dry CVAT: negative bilateral Abdomen: negative for suprapubic tenderness   POCT:  2+ leuk's and nitrates  A:  R/O UTI    P: Reviewed findings of UTI and need for treatment. Rx:  Macrobid 100 mg BID # [id number] micro, culture Reviewed warning signs and symptoms of UTI and need to advise if occurring. Encouraged to limit soda, tea, and coffee   RV prn

## 2015-11-17 NOTE — Patient Instructions (Signed)

## 2015-11-18 LAB — URINALYSIS, MICROSCOPIC ONLY
Bacteria, UA: NONE SEEN [HPF]
CASTS: NONE SEEN [LPF]
Crystals: NONE SEEN [HPF]
RBC / HPF: NONE SEEN RBC/HPF (ref <=2)
Yeast: NONE SEEN [HPF]

## 2015-11-18 LAB — URINE CULTURE

## 2015-11-19 NOTE — Progress Notes (Signed)
Encounter reviewed by Dr. Brook Amundson C. Silva.  

## 2015-12-13 ENCOUNTER — Encounter: Payer: Self-pay | Admitting: Nurse Practitioner

## 2015-12-13 ENCOUNTER — Ambulatory Visit (INDEPENDENT_AMBULATORY_CARE_PROVIDER_SITE_OTHER): Payer: BLUE CROSS/BLUE SHIELD | Admitting: Nurse Practitioner

## 2015-12-13 VITALS — BP 116/78 | HR 60 | Ht 68.25 in | Wt 208.0 lb

## 2015-12-13 DIAGNOSIS — Z01419 Encounter for gynecological examination (general) (routine) without abnormal findings: Secondary | ICD-10-CM | POA: Diagnosis not present

## 2015-12-13 DIAGNOSIS — E559 Vitamin D deficiency, unspecified: Secondary | ICD-10-CM

## 2015-12-13 DIAGNOSIS — Z0000 Encounter for general adult medical examination without abnormal findings: Secondary | ICD-10-CM | POA: Diagnosis not present

## 2015-12-13 DIAGNOSIS — Z113 Encounter for screening for infections with a predominantly sexual mode of transmission: Secondary | ICD-10-CM | POA: Diagnosis not present

## 2015-12-13 LAB — COMPREHENSIVE METABOLIC PANEL
ALBUMIN: 3.7 g/dL (ref 3.6–5.1)
ALT: 10 U/L (ref 6–29)
AST: 14 U/L (ref 10–35)
Alkaline Phosphatase: 78 U/L (ref 33–130)
BUN: 9 mg/dL (ref 7–25)
CHLORIDE: 108 mmol/L (ref 98–110)
CO2: 27 mmol/L (ref 20–31)
CREATININE: 0.88 mg/dL (ref 0.50–1.05)
Calcium: 9.3 mg/dL (ref 8.6–10.4)
GLUCOSE: 88 mg/dL (ref 65–99)
Potassium: 4.5 mmol/L (ref 3.5–5.3)
SODIUM: 141 mmol/L (ref 135–146)
Total Bilirubin: 0.3 mg/dL (ref 0.2–1.2)
Total Protein: 6.8 g/dL (ref 6.1–8.1)

## 2015-12-13 LAB — CBC
HEMATOCRIT: 36.4 % (ref 35.0–45.0)
Hemoglobin: 11.8 g/dL (ref 11.7–15.5)
MCH: 30.7 pg (ref 27.0–33.0)
MCHC: 32.4 g/dL (ref 32.0–36.0)
MCV: 94.8 fL (ref 80.0–100.0)
MPV: 10 fL (ref 7.5–12.5)
Platelets: 339 10*3/uL (ref 140–400)
RBC: 3.84 MIL/uL (ref 3.80–5.10)
RDW: 14 % (ref 11.0–15.0)
WBC: 7.4 10*3/uL (ref 3.8–10.8)

## 2015-12-13 LAB — LIPID PANEL
CHOL/HDL RATIO: 2.7 ratio (ref <=5.0)
CHOLESTEROL: 144 mg/dL (ref 125–200)
HDL: 54 mg/dL (ref >=46)
LDL Cholesterol: 79 mg/dL (ref <130)
Triglycerides: 55 mg/dL (ref <150)
VLDL: 11 mg/dL (ref <30)

## 2015-12-13 LAB — POCT URINALYSIS DIPSTICK
BILIRUBIN UA: NEGATIVE
Blood, UA: NEGATIVE
GLUCOSE UA: NEGATIVE
KETONES UA: NEGATIVE
LEUKOCYTES UA: NEGATIVE
NITRITE UA: NEGATIVE
Protein, UA: NEGATIVE
Urobilinogen, UA: NEGATIVE
pH, UA: 6

## 2015-12-13 LAB — TSH: TSH: 1.73 m[IU]/L

## 2015-12-13 MED ORDER — ZOLPIDEM TARTRATE 10 MG PO TABS: 10.0000 mg | ORAL_TABLET | Freq: Every evening | ORAL | 5 refills | Status: DC | PRN

## 2015-12-13 MED ORDER — PHENAZOPYRIDINE HCL 200 MG PO TABS: 200.0000 mg | ORAL_TABLET | Freq: Three times a day (TID) | ORAL | 5 refills | Status: DC | PRN

## 2015-12-13 MED ORDER — ESTRADIOL 0.1 MG/GM VA CREA: TOPICAL_CREAM | VAGINAL | 3 refills | Status: DC

## 2015-12-13 NOTE — Progress Notes (Signed)
Patient ID: Crystal Simmons, female   DOB: [birth year], 55 y.o.   MRN: [id number]  55 y.o. G0P0000 Single  African American Fe here for annual exam.  No new health problems.  Father passed in 03/2015,  Mother lives alone and she goes there every other weekend to help care for her.  Brother lives with mother.  Same partner - vaginal dryness is better with coconut oil.  Patient's last menstrual period was 10/14/2005 (approximate).          Sexually active: Yes.    The current method of family planning is post menopausal status.    Exercising: Yes.    eliptical and weight training Smoker:  no  Health Maintenance: Pap: 11/28/13, Negative MMG: 01/01/15, Bi-Rads 1: Negative Colonoscopy: 09/26/10, polyp, recall 5 years aware to get done next year, scheduled 12/2015 TDaP: 11/25/12 HIV: 12/02/14 Hep C: drawn today Labs: HB: 11.3  Urine: negatie   reports that she has never smoked. She has never used smokeless tobacco. She reports that she drinks alcohol. She reports that she does not use drugs.  Past Medical History:  Diagnosis Date  . 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 Endometriosis   . Fibroids 10/95  . 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 Insomnia   . Menopausal state 2007  . STD (sexually transmitted disease) 8/09   chlamydia  . Vitamin D deficiency disease 2010    Past Surgical History:  Procedure Laterality Date  . BREAST SURGERY Left 6/03   Biopsy negative  . CERVIX LESION DESTRUCTION  4/92  . COLONOSCOPY W/ BIOPSIES  09/26/10   polyp benign  . COLPOSCOPY  10/12   CIN I  . KNEE SURGERY  12/2009   left for meniscal tear  . LAPAROSCOPY  7/02   multiple surgeries, including LSO, LOA, and endometrioma, and fibroids secondary to endometriosis  . UMBILICAL EXPLORATION  0000000   for endometrioma     Current Outpatient Prescriptions  Medication Sig Dispense Refill  . cetirizine (ZYRTEC) 10 MG tablet Take 10 mg by mouth daily.    Marland Kitchen estradiol (ESTRACE VAGINAL) 0.1 MG/GM  vaginal cream Use twice weekly 42.5 g 3  . fish oil-omega-3 fatty acids 1000 MG capsule Take 2 g by mouth daily.    . fluticasone (FLONASE) 50 MCG/ACT nasal spray Place 2 sprays into the nose daily.    . methocarbamol (ROBAXIN) 500 MG tablet TAKE 1 TABLET BY MOUTH 4 TIMES A DAY 20 tablet 0  . Multiple Vitamins-Minerals (MULTIVITAMIN WITH MINERALS) tablet Take 1 tablet by mouth daily.    . nitrofurantoin, macrocrystal-monohydrate, (MACROBID) 100 MG capsule Take 1 capsule (100 mg total) by mouth 2 (two) times daily. 14 capsule 0  . phenazopyridine (PYRIDIUM) 200 MG tablet Take 1 tablet (200 mg total) by mouth 3 (three) times daily as needed for pain. 30 tablet 5  . polyethylene glycol (MIRALAX / GLYCOLAX) packet Take 1 packet by mouth 3 (three) times a week.  11  . zolpidem (AMBIEN) 10 MG tablet TAKE 1 TABLET BY MOUTH AT BEDTIME AS NEEDED FOR SLEEP 30 tablet 0   No current facility-administered medications for this visit.     Family History  Problem Relation Age of Onset  . Hypertension Mother   . Diabetes Father   . Hypertension Father   . Diabetes Brother   . Diabetes Paternal Grandfather     ROS:  Pertinent items are noted in HPI.  Otherwise, a comprehensive ROS was negative.  Exam:   LMP 10/14/2005 (Approximate)    Ht Readings from Last 3 Encounters:  11/17/15 5' 8.25" (1.734 m)  01/29/15 5' 8.25" (1.734 m)  12/02/14 5' 8.25" (1.734 m)    General appearance: alert, cooperative and appears stated age Head: Normocephalic, without obvious abnormality, atraumatic Neck: no adenopathy, supple, symmetrical, trachea midline and thyroid normal to inspection and palpation Lungs: clear to auscultation bilaterally Breasts: normal appearance, no masses or tenderness Heart: regular rate and rhythm Abdomen: soft, non-tender; no masses,  no organomegaly Extremities: extremities normal, atraumatic, no cyanosis or edema Skin: Skin color, texture, turgor normal. No rashes or lesions Lymph  nodes: Cervical, supraclavicular, and axillary nodes normal. No abnormal inguinal nodes palpated Neurologic: Grossly normal   Pelvic: External genitalia:  no lesions              Urethra:  normal appearing urethra with no masses, tenderness or lesions              Bartholin's and Skene's: normal                 Vagina: normal appearing vagina with normal color and discharge, no lesions              Cervix: anteverted              Pap taken: No. Bimanual Exam:  Uterus:  normal size, contour, position, consistency, mobility, non-tender              Adnexa: no mass, fullness, tenderness               Rectovaginal: Confirms               Anus:  normal sphincter tone, no lesions  Chaperone present: yes  A:  Well Woman with normal exam  Postmenopausal no HRT Atrophic vaginitis on Estrace vaginal cream, urethritis PC R/O STD's  Multiple laparotomies due to endometriosis and endometrioma  S/P LSO, LOA 08/2000  Chronic insomnia   P:   Reviewed health and wellness pertinent to exam  Pap smear as above  Mammogram is due end of this month and will schedule  Refill on vaginal estrogen for a year  Counseled with risk of DVT, CVA, cancer  Refill on Ambien for insomnia to use only prn  Refill on Pyridium to use prn for urethritis  Counseled on breast self exam, mammography screening, use and side effects of HRT, adequate intake of calcium and vitamin D, diet and exercise, Kegel's exercises return annually or prn  An After Visit Summary was printed and given to the patient.

## 2015-12-13 NOTE — Patient Instructions (Signed)

## 2015-12-14 LAB — STD PANEL
HEP B S AG: NEGATIVE
HIV: NONREACTIVE

## 2015-12-14 LAB — VITAMIN D 25 HYDROXY (VIT D DEFICIENCY, FRACTURES): Vit D, 25-Hydroxy: 28 ng/mL — ABNORMAL LOW (ref 30–100)

## 2015-12-14 LAB — GC/CHLAMYDIA PROBE AMP
CT Probe RNA: NOT DETECTED
GC Probe RNA: NOT DETECTED

## 2015-12-14 LAB — HEPATITIS C ANTIBODY: HCV AB: NEGATIVE

## 2015-12-14 LAB — HEMOGLOBIN A1C
HEMOGLOBIN A1C: 5.4 % (ref <5.7)
MEAN PLASMA GLUCOSE: 108 mg/dL

## 2015-12-14 NOTE — Progress Notes (Signed)
Encounter reviewed by Dr. Aundria Rud. I recommend evaluation for chronic insomnia with neurology. Chronic use of Ambien.

## 2015-12-15 ENCOUNTER — Encounter: Payer: Self-pay | Admitting: Nurse Practitioner

## 2015-12-15 ENCOUNTER — Other Ambulatory Visit: Payer: Self-pay | Admitting: Nurse Practitioner

## 2015-12-15 MED ORDER — VITAMIN D (ERGOCALCIFEROL) 1.25 MG (50000 UNIT) PO CAPS: 50000.0000 [IU] | ORAL_CAPSULE | ORAL | 0 refills | Status: DC

## 2015-12-16 LAB — HEMOGLOBIN, FINGERSTICK: Hemoglobin, fingerstick: 11.3 g/dL — ABNORMAL LOW (ref 12.0–16.0)

## 2015-12-17 ENCOUNTER — Telehealth: Payer: Self-pay | Admitting: *Deleted

## 2015-12-17 NOTE — Telephone Encounter (Signed)
Spoke with patient. Called in reference to referral message as seen below.Patient states insomnia was not discussed at 12/13/15 AEX she is not sure where this is coming from. Patient states her concern was about being tired all the time and vitamin D was prescribed for this. Reviewed patients medication and asked patient if she is taking [place], states she only uses [place] "as needed". Reviewed med history and use of ambien nightly for years and advised this is the reason for the referral, for specialist to evaluate.  Patient states again this was not discussed. Advised patient Kem Boroughs, NP is out of the office, will review with covering provider and return call for any additional recommendations.   Dr. Quincy Simmonds, please advise?   Cc: Kem Boroughs, NP

## 2015-12-17 NOTE — Telephone Encounter (Signed)
-----   Message from Kem Boroughs, [place] sent at 12/15/2015  1:42 PM EDT ----- Regarding: FW: I recommend evaluation for chronic insomnia Could you reach out to her and advise that she see a neurologist for evaluation of insomnia per Dr. Quincy Simmonds. ----- Message ----- From: Nunzio Cobbs, MD Sent: 12/14/2015   5:57 AM To: Kem Boroughs, FNP Subject: I recommend evaluation for chronic insomnia    Hi Patty,   I recommend a referral to neurology for chronic insomnia.  It looks like the patient is taking Ambien nightly for years.   It is time for a specialist to evaluate and start prescribing for the insomnia.   Please facilitate this.  Thanks,   [employer]

## 2015-12-17 NOTE — Telephone Encounter (Signed)
Spoke with patient. Reviewed with patient Dr. Elza Rafter recommendations as seen below. Patient states she does not feel this is a chronic issue that she needs to see a neurologist about. Patient states she would like to see Kem Boroughs, NP to discuss. Offered to schedule patient with Kem Boroughs, NP- patient declined. Patient states she has colonoscopy and MMG coming up. Patient states she does not feel this needs to be addressed urgently. Provided patient with office contact information to return call for scheduling. Patient verbalizes understanding.   Routing to provider for final review. Patient is agreeable to disposition. Will close encounter.   Cc: Kem Boroughs, NP

## 2015-12-17 NOTE — Telephone Encounter (Signed)
Patient is correct, the insomnia was not discussed by Patty at her visit.  When I reviewed the patient's chart I noted that this was a chronic medical problem and that the prescription for the Ambien has been long term and consistent on a monthly basis.  We try to assist our patients with chronic insomnia by having a sleep specialist see and evaluate the patient to be sure there are no underlying medical issues causing this. Perhaps the patient would like to see Patty for an appointment to discuss this further?

## 2015-12-21 ENCOUNTER — Other Ambulatory Visit: Payer: Self-pay | Admitting: Nurse Practitioner

## 2015-12-21 DIAGNOSIS — K5904 Chronic idiopathic constipation: Secondary | ICD-10-CM | POA: Diagnosis not present

## 2015-12-21 DIAGNOSIS — Z1211 Encounter for screening for malignant neoplasm of colon: Secondary | ICD-10-CM | POA: Diagnosis not present

## 2015-12-21 DIAGNOSIS — E669 Obesity, unspecified: Secondary | ICD-10-CM | POA: Diagnosis not present

## 2015-12-21 DIAGNOSIS — Z1231 Encounter for screening mammogram for malignant neoplasm of breast: Secondary | ICD-10-CM

## 2015-12-21 DIAGNOSIS — Z86010 Personal history of colonic polyps: Secondary | ICD-10-CM | POA: Diagnosis not present

## 2015-12-29 ENCOUNTER — Encounter (INDEPENDENT_AMBULATORY_CARE_PROVIDER_SITE_OTHER): Payer: Self-pay | Admitting: Orthopedic Surgery

## 2015-12-29 ENCOUNTER — Ambulatory Visit (INDEPENDENT_AMBULATORY_CARE_PROVIDER_SITE_OTHER): Payer: BLUE CROSS/BLUE SHIELD | Admitting: Orthopedic Surgery

## 2015-12-29 VITALS — Ht 68.0 in | Wt 208.0 lb

## 2015-12-29 DIAGNOSIS — M1711 Unilateral primary osteoarthritis, right knee: Secondary | ICD-10-CM

## 2015-12-29 DIAGNOSIS — M1712 Unilateral primary osteoarthritis, left knee: Secondary | ICD-10-CM

## 2015-12-29 DIAGNOSIS — M170 Bilateral primary osteoarthritis of knee: Secondary | ICD-10-CM

## 2015-12-29 MED ORDER — LIDOCAINE HCL 1 % IJ SOLN
5.0000 mL | INTRAMUSCULAR | Status: AC | PRN
  Administered 2015-12-29: 5 mL

## 2015-12-29 MED ORDER — METHYLPREDNISOLONE ACETATE 40 MG/ML IJ SUSP
40.0000 mg | INTRAMUSCULAR | Status: AC | PRN
  Administered 2015-12-29: 40 mg via INTRA_ARTICULAR

## 2015-12-29 NOTE — Progress Notes (Signed)
Office Visit Note   Patient: Crystal Simmons           Date of Birth: [birth year]           MRN: [id number] Visit Date: 12/29/2015              Requested by: Kathyrn Lass, MD [place], [place] [id number] PCP: Tawanna Solo, MD   Assessment & Plan: Visit Diagnoses:  1. Bilateral primary osteoarthritis of knee     Plan:Both knees injected she tolerated this well continue with her activities as tolerated no restrictions follow-up as needed.  Both the knees:low-Up Instructions: Return if symptoms worsen or fail to improve.   Orders:  No orders of the defined types were placed in this encounter.  No orders of the defined types were placed in this encounter.     Procedures: Large Joint Inj Date/Time: 12/29/2015 4:11 PM Performed by: Ameliana Brashear V Authorized by: Newt Minion   Consent Given by:  Patient Site marked: the procedure site was marked   Timeout: prior to procedure the correct patient, procedure, and site was verified   Indications:  Pain and diagnostic evaluation Location:  Knee Site:  R knee Prep: patient was prepped and draped in usual sterile fashion   Needle Size:  22 G Needle Length:  1.5 inches Approach:  Anteromedial Ultrasound Guidance: No   Fluoroscopic Guidance: No   Arthrogram: No   Medications:  5 mL lidocaine 1 %; 40 mg methylPREDNISolone acetate 40 MG/ML Aspiration Attempted: No   Patient tolerance:  Patient tolerated the procedure well with no immediate complications Large Joint Inj Date/Time: 12/29/2015 4:11 PM Performed by: Blaise Grieshaber V Authorized by: Newt Minion   Consent Given by:  Patient Site marked: the procedure site was marked   Timeout: prior to procedure the correct patient, procedure, and site was verified   Indications:  Pain and diagnostic evaluation Location:  Knee Site:  L knee Prep: patient was prepped and draped in usual sterile fashion   Needle Size:  22 G Needle Length:  1.5 inches Approach:   Anteromedial Ultrasound Guidance: No   Fluoroscopic Guidance: No   Arthrogram: No   Medications:  5 mL lidocaine 1 %; 40 mg methylPREDNISolone acetate 40 MG/ML Aspiration Attempted: No   Patient tolerance:  Patient tolerated the procedure well with no immediate complications     Clinical Data: No additional findings.   Subjective: Chief Complaint  Patient presents with  . Left Knee - Pain  . Right Knee - Pain    Patient complaints of bilateral knee pain right worse than left. On the right she complains of posterior knee pain a " burning" pain and this radiates down the calf. States that she has painful ambulation and swelling that does not allow her to have full flexion. Aleve and Advil prn for the pain and this has been going on for three weeks without injury with no help. She did have xrays of this knee back in June that were WNL. The left knee s/p injection at that same time and states that " its time for another one" osteoarthritis in the knee and the injections do help with the pain.    Patient complains of decreased range of motion of the right knee.  Review of Systems   Objective: Vital Signs: Ht 5\' 8"  (1.727 m)   Wt 208 lb (94.3 kg)   LMP 10/14/2005 (Approximate)   BMI 31.63 kg/m   Physical Exam Patient  is alert oriented no adenopathy well-dressed normal affect normal respiratory effort.  Patient does have an antalgic gait. She has a mild effusion of the right knee greater than the left knee. There is crepitation with range of motion patellofemoral joint medial and lateral joint lines are minimally tender to palpation closing procedure stable bilaterally.   Ortho Exam  Specialty Comments:  No specialty comments available.  Imaging: No results found.   PMFS History: There are no active problems to display for this patient.  Past Medical History:  Diagnosis Date  . 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 Endometriosis   . Fibroids 10/95  .  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 Insomnia   . Menopausal state 2007  . STD (sexually transmitted disease) 8/09   chlamydia  . Vitamin D deficiency disease 2010    Family History  Problem Relation Age of Onset  . Hypertension Mother   . Diabetes Father   . Hypertension Father   . Diabetes Brother   . Diabetes Paternal Grandfather     Past Surgical History:  Procedure Laterality Date  . BREAST SURGERY Left 6/03   Biopsy negative  . CERVIX LESION DESTRUCTION  4/92  . COLONOSCOPY W/ BIOPSIES  09/26/10   polyp benign  . COLPOSCOPY  10/12   CIN I  . KNEE SURGERY  12/2009   left for meniscal tear  . LAPAROSCOPY  7/02   multiple surgeries, including LSO, LOA, and endometrioma, and fibroids secondary to endometriosis  . UMBILICAL EXPLORATION  0000000   for endometrioma    Social History   Occupational History  . [employer] engineer   Social History Main Topics  . Smoking status: Never Smoker  . Smokeless tobacco: Never Used  . Alcohol use Yes     Comment: infrequent  . Drug use: No  . Sexual activity: Yes    Partners: Male    Birth control/ protection: Post-menopausal

## 2016-01-21 DIAGNOSIS — D124 Benign neoplasm of descending colon: Secondary | ICD-10-CM | POA: Diagnosis not present

## 2016-01-21 DIAGNOSIS — Z1211 Encounter for screening for malignant neoplasm of colon: Secondary | ICD-10-CM | POA: Diagnosis not present

## 2016-01-21 DIAGNOSIS — K635 Polyp of colon: Secondary | ICD-10-CM | POA: Diagnosis not present

## 2016-01-24 ENCOUNTER — Ambulatory Visit
Admission: RE | Admit: 2016-01-24 | Discharge: 2016-01-24 | Disposition: A | Discharge status: Home / Self Care | Payer: BLUE CROSS/BLUE SHIELD | Source: Ambulatory Visit | Attending: Nurse Practitioner | Admitting: Nurse Practitioner

## 2016-01-24 DIAGNOSIS — Z1231 Encounter for screening mammogram for malignant neoplasm of breast: Secondary | ICD-10-CM | POA: Diagnosis not present

## 2016-02-29 ENCOUNTER — Encounter: Payer: Self-pay | Admitting: Nurse Practitioner

## 2016-02-29 ENCOUNTER — Ambulatory Visit (INDEPENDENT_AMBULATORY_CARE_PROVIDER_SITE_OTHER): Payer: BLUE CROSS/BLUE SHIELD | Admitting: Nurse Practitioner

## 2016-02-29 VITALS — BP 124/80 | HR 64 | Ht 68.0 in

## 2016-02-29 DIAGNOSIS — R319 Hematuria, unspecified: Secondary | ICD-10-CM | POA: Diagnosis not present

## 2016-02-29 DIAGNOSIS — N952 Postmenopausal atrophic vaginitis: Secondary | ICD-10-CM | POA: Diagnosis not present

## 2016-02-29 DIAGNOSIS — N390 Urinary tract infection, site not specified: Secondary | ICD-10-CM

## 2016-02-29 LAB — POCT URINALYSIS DIPSTICK
BILIRUBIN UA: NEGATIVE
Blood, UA: NEGATIVE
GLUCOSE UA: NEGATIVE
KETONES UA: NEGATIVE
Nitrite, UA: POSITIVE
Protein, UA: NEGATIVE
UROBILINOGEN UA: NEGATIVE
pH, UA: 6

## 2016-02-29 MED ORDER — CIPROFLOXACIN HCL 500 MG PO TABS: 500.0000 mg | ORAL_TABLET | Freq: Two times a day (BID) | ORAL | 0 refills | Status: DC

## 2016-02-29 MED ORDER — FLUCONAZOLE 150 MG PO TABS: 150.0000 mg | ORAL_TABLET | Freq: Once | ORAL | 0 refills | Status: AC

## 2016-02-29 MED ORDER — NITROFURANTOIN MONOHYD MACRO 100 MG PO CAPS: ORAL_CAPSULE | ORAL | 5 refills | Status: DC

## 2016-02-29 MED ORDER — ESTRADIOL 0.1 MG/GM VA CREA: TOPICAL_CREAM | VAGINAL | 3 refills | Status: DC

## 2016-02-29 NOTE — Progress Notes (Signed)
56 y.o.Single African American female G0P0000 here with complaint of UTI, with onset  on Saturday 4 days ago. Patient complaining of:  urinary frequency, urinary urgency and flank pain. Patient denies fever, chills, nausea or back pain. No new personal products. Patient feels symptoms are related to sexual activity. Last SA 4-6 days ago and took Macrobid as directed.  Also took Pyridium with little help this time.  Denies vaginal symptoms.    No change in partner.  Menopausal with vaginal dryness - currently off vaginal estrogen. Patient has adequate water intake.  Concerned as she is prone to UTI.   O: Healthy female WDWN Affect: Normal, orientation x 3 Skin : warm and dry CVAT: negative bilateral - heat patch on back for MS pain. Abdomen: positive for suprapubic tenderness   POCT:  + Nitrate and leuk's  A:  UTI  Atrophic vaginitis - off vaginal estrogen  P: Reviewed findings of UTI and need for treatment. Rx:   Will start on Cipro 500 mg BID # 14 - will do TOC in 2 weeks  Will refill Diflucan in case this is needed  Refill on Macrobid to use PC to new pharmacy - after she has completed the Cipro  Refill on Estrace vaginal cream to new pharmacy Lab:    Urine micro, culture Reviewed warning signs and symptoms of UTI and need to advise if occurring. Encouraged to limit soda, tea, and coffee   RV prn

## 2016-02-29 NOTE — Patient Instructions (Signed)
Urinary Tract Infection, Adult Introduction A urinary tract infection (UTI) is an infection of any part of the urinary tract. The urinary tract includes the:  Kidneys.  Ureters.  Bladder.  Urethra. These organs make, store, and get rid of pee (urine) in the body. Follow these instructions at home:  Take over-the-counter and prescription medicines only as told by your doctor.  If you were prescribed an antibiotic medicine, take it as told by your doctor. Do not stop taking the antibiotic even if you start to feel better.  Avoid the following drinks:  Alcohol.  Caffeine.  Tea.  Carbonated drinks.  Drink enough fluid to keep your pee clear or pale yellow.  Keep all follow-up visits as told by your doctor. This is important.  Make sure to:  Empty your bladder often and completely. Do not to hold pee for long periods of time.  Empty your bladder before and after sex.  Wipe from front to back after a bowel movement if you are female. Use each tissue one time when you wipe. Contact a doctor if:  You have back pain.  You have a fever.  You feel sick to your stomach (nauseous).  You throw up (vomit).  Your symptoms do not get better after 3 days.  Your symptoms go away and then come back. Get help right away if:  You have very bad back pain.  You have very bad lower belly (abdominal) pain.  You are throwing up and cannot keep down any medicines or water. This information is not intended to replace advice given to you by your health care provider. Make sure you discuss any questions you have with your health care provider. Document Released: 07/19/2007 Document Revised: 07/08/2015 Document Reviewed: 12/21/2014  2017 Elsevier  

## 2016-02-29 NOTE — Progress Notes (Deleted)
Patient ID: Crystal Simmons, female   DOB: [birth year], 56 y.o.   MRN: [id number]  56 y.o. G0P0000 Single  {Race/ethnicity:17218} Fe here for annual exam.    Patient's last menstrual period was 10/14/2005 (approximate).          Sexually active: {yes no:314532}  The current method of family planning is {contraception:315051}.    Exercising: {yes no:314532}  {types:19826} Smoker:  {YES NO:22349}  Health Maintenance: Pap:  *** MMG:  *** Colonoscopy:  *** BMD:   *** TDaP:  *** Shingles: *** Pneumonia: *** Hep C and HIV: *** Labs: ***   reports that she has never smoked. She has never used smokeless tobacco. She reports that she drinks alcohol. She reports that she does not use drugs.  Past Medical History:  Diagnosis Date  . 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 Endometriosis   . Fibroids 10/95  . 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 Insomnia   . Menopausal state 2007  . STD (sexually transmitted disease) 8/09   chlamydia  . Vitamin D deficiency disease 2010    Past Surgical History:  Procedure Laterality Date  . BREAST SURGERY Left 6/03   Biopsy negative  . CERVIX LESION DESTRUCTION  4/92  . COLONOSCOPY W/ BIOPSIES  09/26/10   polyp benign  . COLPOSCOPY  10/12   CIN I  . KNEE SURGERY  12/2009   left for meniscal tear  . LAPAROSCOPY  7/02   multiple surgeries, including LSO, LOA, and endometrioma, and fibroids secondary to endometriosis  . UMBILICAL EXPLORATION  0000000   for endometrioma     Current Outpatient Prescriptions  Medication Sig Dispense Refill  . cetirizine (ZYRTEC) 10 MG tablet Take 10 mg by mouth daily.    Marland Kitchen estradiol (ESTRACE VAGINAL) 0.1 MG/GM vaginal cream Use twice weekly 42.5 g 3  . fish oil-omega-3 fatty acids 1000 MG capsule Take 2 g by mouth daily.    . fluticasone (FLONASE) 50 MCG/ACT nasal spray Place 2 sprays into the nose daily.    . methocarbamol (ROBAXIN) 500 MG tablet TAKE 1 TABLET BY MOUTH 4 TIMES A DAY 20 tablet 0  . Multiple  Vitamins-Minerals (MULTIVITAMIN WITH MINERALS) tablet Take 1 tablet by mouth daily.    . phenazopyridine (PYRIDIUM) 200 MG tablet Take 1 tablet (200 mg total) by mouth 3 (three) times daily as needed for pain. 30 tablet 5  . polyethylene glycol (MIRALAX / GLYCOLAX) packet Take 1 packet by mouth 3 (three) times a week.  11  . Vitamin D, Ergocalciferol, (DRISDOL) 50000 units CAPS capsule Take 1 capsule (50,000 Units total) by mouth every 7 (seven) days. 30 capsule 0  . zolpidem (AMBIEN) 10 MG tablet Take 1 tablet (10 mg total) by mouth at bedtime as needed. for sleep 30 tablet 5   No current facility-administered medications for this visit.     Family History  Problem Relation Age of Onset  . Hypertension Mother   . Diabetes Father   . Hypertension Father   . Diabetes Brother   . Diabetes Paternal Grandfather     ROS:  Pertinent items are noted in HPI.  Otherwise, a comprehensive ROS was negative.  Exam:   LMP 10/14/2005 (Approximate)    Ht Readings from Last 3 Encounters:  12/29/15 5\' 8"  (1.727 m)  12/13/15 5' 8.25" (1.734 m)  11/17/15 5' 8.25" (1.734 m)    General appearance: alert, cooperative and appears stated age Head: Normocephalic, without obvious abnormality, atraumatic  Neck: no adenopathy, supple, symmetrical, trachea midline and thyroid {EXAM; THYROID:18604} Lungs: clear to auscultation bilaterally Breasts: {Exam; breast:13139::"normal appearance, no masses or tenderness"} Heart: regular rate and rhythm Abdomen: soft, non-tender; no masses,  no organomegaly Extremities: extremities normal, atraumatic, no cyanosis or edema Skin: Skin color, texture, turgor normal. No rashes or lesions Lymph nodes: Cervical, supraclavicular, and axillary nodes normal. No abnormal inguinal nodes palpated Neurologic: Grossly normal   Pelvic: External genitalia:  no lesions              Urethra:  normal appearing urethra with no masses, tenderness or lesions              Bartholin's  and Skene's: normal                 Vagina: normal appearing vagina with normal color and discharge, no lesions              Cervix: {exam; cervix:14595}              Pap taken: {yes no:314532} Bimanual Exam:  Uterus:  {exam; uterus:12215}              Adnexa: {exam; adnexa:12223}               Rectovaginal: Confirms               Anus:  normal sphincter tone, no lesions  Chaperone present: ***  A:  Well Woman with normal exam  P:   Reviewed health and wellness pertinent to exam  Pap smear as above  {plan; gyn:5269::"mammogram","pap smear","return annually or prn"}  An After Visit Summary was printed and given to the patient.

## 2016-03-01 LAB — URINALYSIS, MICROSCOPIC ONLY
Bacteria, UA: NONE SEEN [HPF]
CRYSTALS: NONE SEEN [HPF]
Casts: NONE SEEN [LPF]
RBC / HPF: NONE SEEN RBC/HPF (ref <=2)
Squamous Epithelial / LPF: NONE SEEN [HPF] (ref <=5)
WBC UA: NONE SEEN WBC/HPF (ref <=5)
Yeast: NONE SEEN [HPF]

## 2016-03-01 LAB — URINE CULTURE: Organism ID, Bacteria: NO GROWTH

## 2016-03-02 NOTE — Progress Notes (Signed)
Encounter reviewed by Dr. Brook Amundson C. Silva.  

## 2016-03-12 ENCOUNTER — Encounter: Payer: Self-pay | Admitting: Nurse Practitioner

## 2016-03-13 ENCOUNTER — Telehealth: Payer: Self-pay | Admitting: *Deleted

## 2016-03-13 NOTE — Telephone Encounter (Signed)
I spoke to the patient.  She states she had colonoscopy in November with Dr. Collene Mares.  Advised patient we would get a copy of report and when it is scanned into EPIC, the reminder will go away.  She is agreeable with this plan.  ----- Message ----- From: Willene Hatchet Sent: 03/12/2016 12:22 AM To: [facility] Clinical Pool Subject: Non-Urgent Medical Question           I had my colon screening but don't see results posted in my medical records

## 2016-03-22 ENCOUNTER — Encounter: Payer: Self-pay | Admitting: Nurse Practitioner

## 2016-05-18 ENCOUNTER — Ambulatory Visit (INDEPENDENT_AMBULATORY_CARE_PROVIDER_SITE_OTHER): Payer: BLUE CROSS/BLUE SHIELD | Admitting: Orthopedic Surgery

## 2016-05-18 DIAGNOSIS — M1711 Unilateral primary osteoarthritis, right knee: Secondary | ICD-10-CM

## 2016-05-18 DIAGNOSIS — M1712 Unilateral primary osteoarthritis, left knee: Secondary | ICD-10-CM

## 2016-05-18 DIAGNOSIS — M170 Bilateral primary osteoarthritis of knee: Secondary | ICD-10-CM | POA: Insufficient documentation

## 2016-05-18 MED ORDER — LIDOCAINE HCL 1 % IJ SOLN
5.0000 mL | INTRAMUSCULAR | Status: AC | PRN
  Administered 2016-05-18: 5 mL

## 2016-05-18 MED ORDER — METHYLPREDNISOLONE ACETATE 40 MG/ML IJ SUSP
40.0000 mg | INTRAMUSCULAR | Status: AC | PRN
  Administered 2016-05-18: 40 mg via INTRA_ARTICULAR

## 2016-05-18 NOTE — Progress Notes (Signed)
Office Visit Note   Patient: Crystal Simmons           Date of Birth: [birth year]           MRN: [id number] Visit Date: 05/18/2016              Requested by: Kathyrn Lass, MD [place], [place] [address] PCP: Tawanna Solo, MD  No chief complaint on file.     HPI: Patient is a 56 year old woman who presents today for evaluation of bilateral knee pain. History of osteoarthritis with good relief from Depo-Medrol injections in the past. Last injections were in November of last year. Complaining of bilateral knee swelling. Did elevate her legs over the weekend without relief. Complaining pain denies any giving way.  Assessment & Plan: Visit Diagnoses:  1. Bilateral primary osteoarthritis of knee     Plan: bilateral knee injections. Follow up in office as needed.  Follow-Up Instructions: Return if symptoms worsen or fail to improve.   Right Knee Exam   Tenderness  The patient is experiencing tenderness in the medial joint line.  Range of Motion  The patient has normal right knee ROM.  Other  Erythema: absent Swelling: mild  Comments:  Posterior tenderness, bakers cyst.   Left Knee Exam   Tenderness  The patient is experiencing tenderness in the medial joint line.  Range of Motion  The patient has normal left knee ROM.  Other  Swelling: none      Patient is alert, oriented, no adenopathy, well-dressed, normal affect, normal respiratory effort.   Imaging: No results found.  Labs: Lab Results  Component Value Date   HGBA1C 5.4 12/13/2015   LABORGA NO GROWTH 02/29/2016    Orders:  No orders of the defined types were placed in this encounter.  No orders of the defined types were placed in this encounter.    Procedures: Large Joint Inj Date/Time: 05/18/2016 4:23 PM Performed by: Suzan Slick Authorized by: Dondra Prader R   Consent Given by:  Patient Site marked: the procedure site was marked   Timeout: prior to procedure the  correct patient, procedure, and site was verified   Indications:  Pain and diagnostic evaluation Location:  Knee Site:  R knee Needle Size:  22 G Needle Length:  1.5 inches Ultrasound Guidance: No   Fluoroscopic Guidance: No   Arthrogram: No   Medications:  5 mL lidocaine 1 %; 40 mg methylPREDNISolone acetate 40 MG/ML Aspiration Attempted: No   Patient tolerance:  Patient tolerated the procedure well with no immediate complications Large Joint Inj Date/Time: 05/18/2016 4:24 PM Performed by: Suzan Slick Authorized by: Dondra Prader R   Consent Given by:  Patient Site marked: the procedure site was marked   Timeout: prior to procedure the correct patient, procedure, and site was verified   Indications:  Pain and diagnostic evaluation Location:  Knee Site:  L knee Needle Size:  22 G Needle Length:  1.5 inches Ultrasound Guidance: No   Fluoroscopic Guidance: No   Arthrogram: No   Medications:  5 mL lidocaine 1 %; 40 mg methylPREDNISolone acetate 40 MG/ML Aspiration Attempted: No   Patient tolerance:  Patient tolerated the procedure well with no immediate complications    Clinical Data: No additional findings.  ROS:  Review of Systems  Constitutional: Negative for chills and fever.  Musculoskeletal: Positive for arthralgias and joint swelling.    Objective: Vital Signs: LMP 10/14/2005 (Approximate)   Specialty Comments:  No specialty comments  available.  PMFS History: Patient Active Problem List   Diagnosis Date Noted  . Bilateral primary osteoarthritis of knee 05/18/2016  . Post-menopausal atrophic vaginitis 02/29/2016   Past Medical History:  Diagnosis Date  . 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 Endometriosis   . Fibroids 10/95  . 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 Insomnia   . Menopausal state 2007  . STD (sexually transmitted disease) 8/09   chlamydia  . Vitamin D deficiency disease 2010    Family History  Problem Relation Age of  Onset  . Hypertension Mother   . Diabetes Father   . Hypertension Father   . Diabetes Brother   . Diabetes Paternal Grandfather     Past Surgical History:  Procedure Laterality Date  . BREAST SURGERY Left 6/03   Biopsy negative  . CERVIX LESION DESTRUCTION  4/92  . COLONOSCOPY W/ BIOPSIES  09/26/10   polyp benign  . COLPOSCOPY  10/12   CIN I  . KNEE SURGERY  12/2009   left for meniscal tear  . LAPAROSCOPY  7/02   multiple surgeries, including LSO, LOA, and endometrioma, and fibroids secondary to endometriosis  . UMBILICAL EXPLORATION  3/82   for endometrioma    Social History   Occupational History  . [employer] engineer   Social History Main Topics  . Smoking status: Never Smoker  . Smokeless tobacco: Never Used  . Alcohol use Yes     Comment: infrequent  . Drug use: No  . Sexual activity: Yes    Partners: Male    Birth control/ protection: Post-menopausal

## 2016-07-06 ENCOUNTER — Ambulatory Visit: Payer: BLUE CROSS/BLUE SHIELD | Admitting: Family Medicine

## 2016-07-06 DIAGNOSIS — K5900 Constipation, unspecified: Secondary | ICD-10-CM | POA: Diagnosis not present

## 2016-07-08 ENCOUNTER — Other Ambulatory Visit: Payer: Self-pay | Admitting: Nurse Practitioner

## 2016-07-11 NOTE — Telephone Encounter (Signed)
Medication refill request: Zolpidem Last AEX:  12/13/15 PG Next AEX: 12/20/16 PG Last MMG (if hormonal medication request): 01/24/16 BIRADS1, density B[facility] Refill authorized: 12/13/15 #[id number]. Please advise. Thank you.

## 2016-07-31 ENCOUNTER — Other Ambulatory Visit: Payer: Self-pay | Admitting: Family Medicine

## 2016-08-04 ENCOUNTER — Ambulatory Visit (INDEPENDENT_AMBULATORY_CARE_PROVIDER_SITE_OTHER): Payer: BLUE CROSS/BLUE SHIELD | Admitting: Family Medicine

## 2016-08-04 ENCOUNTER — Encounter: Payer: Self-pay | Admitting: Family Medicine

## 2016-08-04 ENCOUNTER — Other Ambulatory Visit: Payer: Self-pay

## 2016-08-04 VITALS — BP 130/80

## 2016-08-04 DIAGNOSIS — J309 Allergic rhinitis, unspecified: Secondary | ICD-10-CM | POA: Diagnosis not present

## 2016-08-04 DIAGNOSIS — J0101 Acute recurrent maxillary sinusitis: Secondary | ICD-10-CM

## 2016-08-04 DIAGNOSIS — M25511 Pain in right shoulder: Secondary | ICD-10-CM

## 2016-08-04 MED ORDER — AMOXICILLIN-POT CLAVULANATE 875-125 MG PO TABS: 1.0000 | ORAL_TABLET | Freq: Two times a day (BID) | ORAL | 0 refills | Status: DC

## 2016-08-04 MED ORDER — METHOCARBAMOL 500 MG PO TABS: 500.0000 mg | ORAL_TABLET | Freq: Four times a day (QID) | ORAL | 0 refills | Status: DC

## 2016-08-04 NOTE — Progress Notes (Signed)
   Subjective:    Patient ID: Crystal Simmons, female    DOB: 12-Mar-1960, 56 y.o.   MRN: [id number]  HPI She is again having difficulty with her sinuses. She complains of a one-week history of postnasal drainage followed by left maxillary sinus pressure and tenderness, sore throat, chills but no fever. No earache. She does not smoke. She does have underlying allergies. Presently she is taking Zyrtec and Flonase. She also complains of a several year history of intermittent right shoulder pain. In the past she had physical therapy which did minimally help with this. She notes that stress does play a role she also gets a benefit from using Robaxin.   Review of Systems     Objective:   Physical Exam Alert and in no distress. Nasal mucosa is normal. Tender to palpation over left maxillary sinus. Tympanic membranes and canals are normal. Pharyngeal area is normal. Neck is supple without adenopathy or thyromegaly. Cardiac exam shows a regular sinus rhythm without murmurs or gallops. Lungs are clear to auscultation. Right shoulder exam shows no palpable tenderness. No pain over bicipital groove or before meals joint. Negative sulcus sign. Slight crepitus is noted with Neer's and Hawkins test. Negative drop arm test.       Assessment & Plan:  Right shoulder pain, unspecified chronicity - Plan: DG Shoulder Right, methocarbamol (ROBAXIN) 500 MG tablet  Acute recurrent maxillary sinusitis - Plan: amoxicillin-clavulanate (AUGMENTIN) 875-125 MG tablet  Allergic rhinitis, unspecified seasonality, unspecified trigger She is to call if not entirely better when she finishes the antibiotic. Continue on her allergy medications. Discussed further care for her shoulder and if the x-ray is negative, recommend she come back for an injection to see if this will help. May also refer back to physical therapy after that.

## 2016-08-04 NOTE — Telephone Encounter (Signed)
Called in robaxin per jcl

## 2016-09-05 ENCOUNTER — Telehealth: Payer: Self-pay | Admitting: Obstetrics & Gynecology

## 2016-09-05 NOTE — Telephone Encounter (Signed)
Left message regarding upcoming appointment has been canceled and needs to be rescheduled. °

## 2016-12-01 DIAGNOSIS — M549 Dorsalgia, unspecified: Secondary | ICD-10-CM | POA: Diagnosis not present

## 2016-12-01 DIAGNOSIS — M25559 Pain in unspecified hip: Secondary | ICD-10-CM | POA: Diagnosis not present

## 2016-12-01 DIAGNOSIS — M25512 Pain in left shoulder: Secondary | ICD-10-CM | POA: Diagnosis not present

## 2016-12-01 DIAGNOSIS — T1490XA Injury, unspecified, initial encounter: Secondary | ICD-10-CM | POA: Diagnosis not present

## 2016-12-04 ENCOUNTER — Ambulatory Visit (INDEPENDENT_AMBULATORY_CARE_PROVIDER_SITE_OTHER): Payer: BLUE CROSS/BLUE SHIELD | Admitting: Family Medicine

## 2016-12-04 ENCOUNTER — Ambulatory Visit
Admission: RE | Admit: 2016-12-04 | Discharge: 2016-12-04 | Disposition: A | Discharge status: Home / Self Care | Payer: BLUE CROSS/BLUE SHIELD | Source: Ambulatory Visit | Attending: Family Medicine | Admitting: Family Medicine

## 2016-12-04 ENCOUNTER — Telehealth: Payer: Self-pay | Admitting: Family Medicine

## 2016-12-04 ENCOUNTER — Encounter: Payer: Self-pay | Admitting: Family Medicine

## 2016-12-04 DIAGNOSIS — S299XXA Unspecified injury of thorax, initial encounter: Secondary | ICD-10-CM | POA: Diagnosis not present

## 2016-12-04 DIAGNOSIS — R0781 Pleurodynia: Secondary | ICD-10-CM

## 2016-12-04 DIAGNOSIS — R0789 Other chest pain: Secondary | ICD-10-CM

## 2016-12-04 DIAGNOSIS — S3992XA Unspecified injury of lower back, initial encounter: Secondary | ICD-10-CM

## 2016-12-04 MED ORDER — METHOCARBAMOL 500 MG PO TABS: 500.0000 mg | ORAL_TABLET | Freq: Four times a day (QID) | ORAL | 0 refills | Status: DC
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
## 2016-12-04 NOTE — Telephone Encounter (Signed)
Recv'd fax from [facility] states Methocarbamol on backorder need alternative medication

## 2016-12-04 NOTE — Patient Instructions (Addendum)
Continue taking ibuprofen 800 mg three times daily with food, use heat to the area 2-3 times per day and you may take the Robaxin as needed for moderate to severe pain. Do not drink alcohol or drive with this medication.   Take good deep breaths several times per day as discussed to keep your lungs expanded.   We will call you with your XR results. Call or return if you notice any new or worsening symptoms.

## 2016-12-04 NOTE — Telephone Encounter (Signed)
Have her call another pharmacy to see if they have it. I know some pharmacies do not but others do. We can change the dose if we need to as well if they tell her they have a different dose available.

## 2016-12-05 ENCOUNTER — Telehealth: Payer: Self-pay | Admitting: Family Medicine

## 2016-12-05 NOTE — Telephone Encounter (Signed)
Pt was notified of her results of xray, however [facility] was [facility] the first time and pt uses [facility] and it was resent there and she was able to pick it up yesterday from [facility]. So we will disregard [facility]

## 2016-12-05 NOTE — Telephone Encounter (Signed)
Records received from [facility]

## 2016-12-06 ENCOUNTER — Telehealth: Payer: Self-pay | Admitting: Family Medicine

## 2016-12-06 ENCOUNTER — Other Ambulatory Visit: Payer: Self-pay | Admitting: Family Medicine

## 2016-12-06 NOTE — Telephone Encounter (Signed)
Spoke to pt and she states that the med was already resent to [facility] on [address] by Gabriel Cirri and that is her preferred pharmacy. [facility] had the med in stock so pt has the Methocarbamol 500 mg and using it.

## 2016-12-06 NOTE — Telephone Encounter (Signed)
I will send in a different medication. Please just let her know. Thanks.

## 2016-12-06 NOTE — Telephone Encounter (Signed)
Thank you. I will not send in anything else then.

## 2016-12-06 NOTE — Telephone Encounter (Signed)
Rcvd note from [facility] @ [address] stating that Methocarbamol 500 mg is on backorder or unavailable & to please send an alternative

## 2016-12-08 ENCOUNTER — Telehealth: Payer: Self-pay | Admitting: Family Medicine

## 2016-12-08 DIAGNOSIS — S3992XA Unspecified injury of lower back, initial encounter: Secondary | ICD-10-CM

## 2016-12-08 DIAGNOSIS — R0781 Pleurodynia: Secondary | ICD-10-CM

## 2016-12-08 DIAGNOSIS — R0789 Other chest pain: Secondary | ICD-10-CM

## 2016-12-08 NOTE — Telephone Encounter (Signed)
I am ok with this. I also recommend that she follow up with her orthopedist if her pain is not improving.

## 2016-12-08 NOTE — Telephone Encounter (Signed)
Pt called back and is wanting a referral to PT states she is not feeling any better, she shoulder is still hurting,, the swelling has went down in her tailbone, she can be reached at [phone]

## 2016-12-08 NOTE — Telephone Encounter (Signed)
Pt was notified that PT will be done and that they will contact her and follow-up with ortho if not improving

## 2016-12-14 ENCOUNTER — Ambulatory Visit: Payer: BLUE CROSS/BLUE SHIELD | Attending: Family Medicine | Admitting: Physical Therapy

## 2016-12-14 DIAGNOSIS — M25512 Pain in left shoulder: Secondary | ICD-10-CM | POA: Insufficient documentation

## 2016-12-14 DIAGNOSIS — M546 Pain in thoracic spine: Secondary | ICD-10-CM | POA: Insufficient documentation

## 2016-12-14 DIAGNOSIS — R293 Abnormal posture: Secondary | ICD-10-CM | POA: Diagnosis not present

## 2016-12-14 DIAGNOSIS — M545 Low back pain, unspecified: Secondary | ICD-10-CM

## 2016-12-14 DIAGNOSIS — M25511 Pain in right shoulder: Secondary | ICD-10-CM | POA: Diagnosis not present

## 2016-12-14 NOTE — Patient Instructions (Signed)
Cane Exercise: Flexion   Lie on back, holding cane above chest. Keeping arms as straight as possible, lower cane toward floor beyond head. Hold __5-10__ seconds. Repeat __10-15__ times. Do _2___ sessions per day.  Cane Exercise: Abduction   Hold cane with right hand over end, palm-up, with other hand palm-down. Move arm out from side and up by pushing with other arm. Hold __5-10__ seconds. Repeat __10-15__ times. Do __2__ sessions per day.  Scapular Retraction (Standing)   With arms at sides, pinch shoulder blades together. Repeat _15___ times per set. Do _2___ sets per session.   Axial Extension (Chin Tuck)   Pull chin in and lengthen back of neck. Hold __5__ seconds while counting out loud. Repeat _10-15___ times. Do _2-3___ sessions per day.
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
## 2016-12-19 ENCOUNTER — Encounter: Payer: Self-pay | Admitting: Certified Nurse Midwife

## 2016-12-19 ENCOUNTER — Other Ambulatory Visit (HOSPITAL_COMMUNITY)
Admission: RE | Admit: 2016-12-19 | Discharge: 2016-12-19 | Disposition: A | Discharge status: Home / Self Care | Payer: BLUE CROSS/BLUE SHIELD | Source: Ambulatory Visit | Attending: Certified Nurse Midwife | Admitting: Certified Nurse Midwife

## 2016-12-19 ENCOUNTER — Ambulatory Visit (INDEPENDENT_AMBULATORY_CARE_PROVIDER_SITE_OTHER): Payer: BLUE CROSS/BLUE SHIELD | Admitting: Certified Nurse Midwife

## 2016-12-19 VITALS — BP 114/74 | HR 70 | Resp 16 | Ht 68.25 in | Wt 215.0 lb

## 2016-12-19 DIAGNOSIS — Z01419 Encounter for gynecological examination (general) (routine) without abnormal findings: Secondary | ICD-10-CM | POA: Diagnosis not present

## 2016-12-19 DIAGNOSIS — D649 Anemia, unspecified: Secondary | ICD-10-CM | POA: Insufficient documentation

## 2016-12-19 DIAGNOSIS — Z8742 Personal history of other diseases of the female genital tract: Secondary | ICD-10-CM | POA: Insufficient documentation

## 2016-12-19 DIAGNOSIS — Z833 Family history of diabetes mellitus: Secondary | ICD-10-CM | POA: Diagnosis not present

## 2016-12-19 DIAGNOSIS — N952 Postmenopausal atrophic vaginitis: Secondary | ICD-10-CM | POA: Diagnosis not present

## 2016-12-19 DIAGNOSIS — Z0000 Encounter for general adult medical examination without abnormal findings: Secondary | ICD-10-CM

## 2016-12-19 DIAGNOSIS — E559 Vitamin D deficiency, unspecified: Secondary | ICD-10-CM | POA: Insufficient documentation

## 2016-12-19 DIAGNOSIS — Z862 Personal history of diseases of the blood and blood-forming organs and certain disorders involving the immune mechanism: Secondary | ICD-10-CM | POA: Diagnosis not present

## 2016-12-19 DIAGNOSIS — Z8249 Family history of ischemic heart disease and other diseases of the circulatory system: Secondary | ICD-10-CM | POA: Insufficient documentation

## 2016-12-19 DIAGNOSIS — Z1151 Encounter for screening for human papillomavirus (HPV): Secondary | ICD-10-CM | POA: Diagnosis not present

## 2016-12-19 DIAGNOSIS — Z79899 Other long term (current) drug therapy: Secondary | ICD-10-CM | POA: Insufficient documentation

## 2016-12-19 DIAGNOSIS — Z87410 Personal history of cervical dysplasia: Secondary | ICD-10-CM | POA: Diagnosis not present

## 2016-12-19 DIAGNOSIS — Z124 Encounter for screening for malignant neoplasm of cervix: Secondary | ICD-10-CM | POA: Diagnosis not present

## 2016-12-19 MED ORDER — ESTRADIOL 0.1 MG/GM VA CREA: TOPICAL_CREAM | VAGINAL | 3 refills | Status: DC

## 2016-12-19 NOTE — Progress Notes (Signed)
56 y.o. G0P0000 Single  African American Fe here for annual exam. Currently treating sinusitis, with OTC medication.  Working on weight loss due to 7 pound weight gain. Has done Weight watchers before.sees Dr. Redmond School, Kathyrn Lass yearly. Labs here. No partner change but desires  STD screening. Using Estrace vaginal cream twice weekly for dryness with fair results, desires continuance. Also noted slight discomfort with sexual activity, would like checked. No other health issues today. Discussed Ambien refills with PCP now.  Patient's last menstrual period was 10/14/2005 (approximate).          Sexually active: Yes.    The current method of family planning is post menopausal status.    Exercising: Yes.    elliptical, walking, weights Smoker:  no  Health Maintenance: Pap:  11-28-13 neg History of Abnormal Pap: yes 1992 cryo MMG:  12-11-17category b density birads 1:neg Self Breast exams: occ, biopsy benign  Colonoscopy:  01/2016 f/u 37yrs per patient , polyps with Dr. Collene Mares BMD:   none TDaP:  2014 Shingles: no Pneumonia: no Hep C and HIV: HIV & Hep c neg 2017 Labs: yes   reports that  has never smoked. she has never used smokeless tobacco. She reports that she drinks alcohol. She reports that she does not use drugs.  Past Medical History:  Diagnosis Date  . 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 Endometriosis   . Fibroids 10/95  . 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 Insomnia   . Menopausal state 2007  . STD (sexually transmitted disease) 8/09   chlamydia  . Vitamin D deficiency disease 2010    Past Surgical History:  Procedure Laterality Date  . BREAST SURGERY Left 6/03   Biopsy negative  . CERVIX LESION DESTRUCTION  4/92  . COLONOSCOPY W/ BIOPSIES  09/26/10   polyp benign  . COLPOSCOPY  10/12   CIN I  . KNEE SURGERY  12/2009   left for meniscal tear  . LAPAROSCOPY  7/02   multiple surgeries, including LSO, LOA, and endometrioma, and fibroids secondary to  endometriosis  . UMBILICAL EXPLORATION  0/99   for endometrioma     Current Outpatient Medications  Medication Sig Dispense Refill  . cetirizine (ZYRTEC) 10 MG tablet Take 10 mg by mouth daily.    . Cholecalciferol (VITAMIN D PO) Take by mouth.    . estradiol (ESTRACE VAGINAL) 0.1 MG/GM vaginal cream Use twice weekly 42.5 g 3  . FERROUS SULFATE PO Take by mouth.    . fish oil-omega-3 fatty acids 1000 MG capsule Take 2 g by mouth daily.    . fluticasone (FLONASE) 50 MCG/ACT nasal spray Place 2 sprays into the nose daily.    Marland Kitchen ibuprofen (ADVIL,MOTRIN) 800 MG tablet TK 1 T PO Q 6 H FOR 10 DAYS PRN PAIN  0  . methocarbamol (ROBAXIN) 500 MG tablet as needed.  0  . polyethylene glycol (MIRALAX / GLYCOLAX) packet Take 1 packet by mouth 3 (three) times a week.  11  . zolpidem (AMBIEN) 10 MG tablet TAKE 1 TABLET BY MOUTH AT BEDTIME AS NEEDED FOR SLEEP 15 tablet 2  . Multiple Vitamins-Minerals (MULTIVITAMIN WITH MINERALS) tablet Take 1 tablet by mouth daily.     No current facility-administered medications for this visit.     Family History  Problem Relation Age of Onset  . Hypertension Mother   . Diabetes Father   . Hypertension Father   . Diabetes Brother   . Diabetes  Paternal Grandfather     ROS:  Pertinent items are noted in HPI.  Otherwise, a comprehensive ROS was negative.  Exam:   BP 114/74   Pulse 70   Resp 16   Ht 5' 8.25" (1.734 m)   Wt 215 lb (97.5 kg)   LMP 10/14/2005 (Approximate)   BMI 32.45 kg/m  Height: 5' 8.25" (173.4 cm) Ht Readings from Last 3 Encounters:  12/19/16 5' 8.25" (1.734 m)  02/29/16 5\' 8"  (1.727 m)  12/29/15 5\' 8"  (1.727 m)    General appearance: alert, cooperative and appears stated age Head: Normocephalic, without obvious abnormality, atraumatic Neck: no adenopathy, supple, symmetrical, trachea midline and thyroid normal to inspection and palpation Lungs: clear to auscultation bilaterally Breasts: normal appearance, no masses or tenderness, No  nipple retraction or dimpling, No nipple discharge or bleeding, No axillary or supraclavicular adenopathy Heart: regular rate and rhythm Abdomen: soft, non-tender; no masses,  no organomegaly Extremities: extremities normal, atraumatic, no cyanosis or edema Skin: Skin color, texture, turgor normal. No rashes or lesions Lymph nodes: Cervical, supraclavicular, and axillary nodes normal. No abnormal inguinal nodes palpated Neurologic: Grossly normal   Pelvic: External genitalia:  no lesions              Urethra:  normal appearing urethra with no masses, tenderness or lesions              Bartholin's and Skene's: normal                 Vagina: normal appearing vagina with normal color and discharge, no lesions              Cervix: no bleeding following Pap, no cervical motion tenderness and no lesions              Pap taken: Yes.   Bimanual Exam:  Uterus:  normal size, contour, position, consistency, mobility, non-tender              Adnexa: normal adnexa and no mass, fullness, tenderness               Rectovaginal: Confirms               Anus:  normal sphincter tone, no lesions  Chaperone present: yes  A:  Well Woman with normal exam  Menopausal  No HRT  Atrophic vaginitis with Estrace cream use, desires continuance  Vaginal dryness  Vitamin d deficiency  History of anemia  History of abnormal pap with cryo  Screening labs    P:   Reviewed health and wellness pertinent to exam  Discussed need to advise if vaginal bleeding  Discussed using cream twice weekly and coconut oil trial other days at hs to see if this helps with dryness. Instructions given.  Continue to work on Vitamin D and iron sources in diet.  Labs: CBC, CMP, Hep C, HIV, Lipid panel ,TSH, RPR, Vitamin D, vaginal screen, Gc/Chlamydia  Pap smear: yes   counseled on breast self exam, mammography screening, STD prevention, HIV risk factors and prevention, adequate intake of calcium and vitamin D, diet and  exercise  return annually or prn  An After Visit Summary was printed and given to the patient.

## 2016-12-19 NOTE — Patient Instructions (Signed)

## 2016-12-20 ENCOUNTER — Ambulatory Visit: Payer: BLUE CROSS/BLUE SHIELD | Admitting: Nurse Practitioner

## 2016-12-20 ENCOUNTER — Encounter: Payer: Self-pay | Admitting: Physical Therapy

## 2016-12-20 ENCOUNTER — Ambulatory Visit: Payer: BLUE CROSS/BLUE SHIELD | Admitting: Physical Therapy

## 2016-12-20 DIAGNOSIS — R293 Abnormal posture: Secondary | ICD-10-CM

## 2016-12-20 DIAGNOSIS — M25512 Pain in left shoulder: Secondary | ICD-10-CM

## 2016-12-20 DIAGNOSIS — M545 Low back pain, unspecified: Secondary | ICD-10-CM

## 2016-12-20 DIAGNOSIS — M546 Pain in thoracic spine: Secondary | ICD-10-CM

## 2016-12-20 DIAGNOSIS — M25511 Pain in right shoulder: Secondary | ICD-10-CM | POA: Diagnosis not present

## 2016-12-20 LAB — COMPREHENSIVE METABOLIC PANEL
A/G RATIO: 1.3 (ref 1.2–2.2)
ALBUMIN: 4.1 g/dL (ref 3.5–5.5)
ALK PHOS: 108 IU/L (ref 39–117)
ALT: 15 IU/L (ref 0–32)
AST: 18 IU/L (ref 0–40)
BILIRUBIN TOTAL: 0.7 mg/dL (ref 0.0–1.2)
BUN / CREAT RATIO: 11 (ref 9–23)
BUN: 8 mg/dL (ref 6–24)
CHLORIDE: 103 mmol/L (ref 96–106)
CO2: 23 mmol/L (ref 20–29)
Calcium: 9.1 mg/dL (ref 8.7–10.2)
Creatinine, Ser: 0.74 mg/dL (ref 0.57–1.00)
GFR calc non Af Amer: 91 mL/min/{1.73_m2} (ref >59)
GFR, EST AFRICAN AMERICAN: 105 mL/min/{1.73_m2} (ref >59)
GLOBULIN, TOTAL: 3.1 g/dL (ref 1.5–4.5)
GLUCOSE: 82 mg/dL (ref 65–99)
POTASSIUM: 4.1 mmol/L (ref 3.5–5.2)
SODIUM: 143 mmol/L (ref 134–144)
TOTAL PROTEIN: 7.2 g/dL (ref 6.0–8.5)

## 2016-12-20 LAB — CBC
Hematocrit: 36.1 % (ref 34.0–46.6)
Hemoglobin: 11.7 g/dL (ref 11.1–15.9)
MCH: 30.1 pg (ref 26.6–33.0)
MCHC: 32.4 g/dL (ref 31.5–35.7)
MCV: 93 fL (ref 79–97)
PLATELETS: 354 10*3/uL (ref 150–379)
RBC: 3.89 x10E6/uL (ref 3.77–5.28)
RDW: 14 % (ref 12.3–15.4)
WBC: 9 10*3/uL (ref 3.4–10.8)

## 2016-12-20 LAB — LIPID PANEL
CHOLESTEROL TOTAL: 128 mg/dL (ref 100–199)
Chol/HDL Ratio: 2.5 ratio (ref 0.0–4.4)
HDL: 51 mg/dL (ref >39)
LDL CALC: 61 mg/dL (ref 0–99)
TRIGLYCERIDES: 80 mg/dL (ref 0–149)
VLDL CHOLESTEROL CAL: 16 mg/dL (ref 5–40)

## 2016-12-20 LAB — VITAMIN D 25 HYDROXY (VIT D DEFICIENCY, FRACTURES): Vit D, 25-Hydroxy: 43.5 ng/mL (ref 30.0–100.0)

## 2016-12-20 LAB — HEPATITIS C ANTIBODY

## 2016-12-20 LAB — HIV ANTIBODY (ROUTINE TESTING W REFLEX): HIV SCREEN 4TH GENERATION: NONREACTIVE

## 2016-12-20 LAB — RPR: RPR Ser Ql: NONREACTIVE

## 2016-12-20 LAB — TSH: TSH: 1.69 u[IU]/mL (ref 0.450–4.500)
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
## 2016-12-20 NOTE — Patient Instructions (Addendum)
Gymball on Wall: Up and Down   Stand ___ feet from wall with right hand supporting a ball on the wall. Lean into ball and move ball up and down. Repeat _10-15__ times. Repeat with other arm for set.  **use towel on wall**  Strengthening: Isometric Flexion  Using wall for resistance, press right fist into ball using light pressure. Hold __5__ seconds. Repeat _10-15___ times per set.   SHOULDER: Abduction (Isometric)  Use wall as resistance. Press arm against pillow. Keep elbow straight. Hold _5__ seconds. _10-15__ reps per set.  Extension (Isometric)  Place left bent elbow and back of arm against wall. Press elbow against wall. Hold _5___ seconds. Repeat __10-15__ times.   Internal Rotation (Isometric)  Place palm of right fist against door frame, with elbow bent. Press fist against door frame. Hold _5___ seconds. Repeat _10-15___ times.   External Rotation (Isometric)  Place back of left fist against door frame, with elbow bent. Press fist against door frame. Hold __5__ seconds. Repeat _10-15___ times.    IONTOPHORESIS PATIENT PRECAUTIONS & CONTRAINDICATIONS:  . Redness under one or both electrodes can occur.  This characterized by a uniform redness that usually disappears within 12 hours of treatment. . Small pinhead size blisters may result in response to the drug.  Contact your physician if the problem persists more than 24 hours. . On rare occasions, iontophoresis therapy can result in temporary skin reactions such as rash, inflammation, irritation or burns.  The skin reactions may be the result of individual sensitivity to the ionic solution used, the condition of the skin at the start of treatment, reaction to the materials in the electrodes, allergies or sensitivity to dexamethasone, or a poor connection between the patch and your skin.  Discontinue using iontophoresis if you have any of these reactions and report to your therapist. . Remove the Patch or electrodes if  you have any undue sensation of pain or burning during the treatment and report discomfort to your therapist. . Tell your Therapist if you have had known adverse reactions to the application of electrical current. . If using the Patch, the LED light will turn off when treatment is complete and the patch can be removed.  Approximate treatment time is 1-3 hours.  Remove the patch when light goes off or after 6 hours. . The Patch can be worn during normal activity, however excessive motion where the electrodes have been placed can cause poor contact between the skin and the electrode or uneven electrical current resulting in greater risk of skin irritation. Marland Kitchen Keep out of the reach of children.   . DO NOT use if you have a cardiac pacemaker or any other electrically sensitive implanted device. . DO NOT use if you have a known sensitivity to dexamethasone. . DO NOT use during Magnetic Resonance Imaging (MRI). . DO NOT use over broken or compromised skin (e.g. sunburn, cuts, or acne) due to the increased risk of skin reaction. . DO NOT SHAVE over the area to be treated:  To establish good contact between the Patch and the skin, excessive hair may be clipped. . DO NOT place the Patch or electrodes on or over your eyes, directly over your heart, or brain. . DO NOT reuse the Patch or electrodes as this may cause burns to occur.

## 2016-12-21 ENCOUNTER — Other Ambulatory Visit: Payer: Self-pay

## 2016-12-21 ENCOUNTER — Telehealth: Payer: Self-pay | Admitting: Certified Nurse Midwife

## 2016-12-21 LAB — CYTOLOGY - PAP
Adequacy: ABSENT
BACTERIAL VAGINITIS: POSITIVE — AB
CANDIDA VAGINITIS: NEGATIVE
Chlamydia: NEGATIVE
DIAGNOSIS: NEGATIVE
HPV (WINDOPATH): NOT DETECTED
Neisseria Gonorrhea: NEGATIVE
Trichomonas: NEGATIVE

## 2016-12-21 MED ORDER — METRONIDAZOLE 500 MG PO TABS: 500.0000 mg | ORAL_TABLET | Freq: Two times a day (BID) | ORAL | 0 refills | Status: DC

## 2016-12-21 NOTE — Telephone Encounter (Signed)
Patient wants to speak to a nurse.  Patient does not want to give any other information.

## 2016-12-21 NOTE — Telephone Encounter (Signed)
Spoke with patient. Patient states that an rx for Flagyl was sent to the pharmacy for her to treat BV. States she gets yeast infections when she takes antibiotics and would like to have Diflucan on file in case she needs it. Advised will review with Melvia Heaps CNM. Requests rx go to pharmacy on file.

## 2016-12-21 NOTE — Telephone Encounter (Signed)
Will treat if she develops symptoms. But increase discharge when treating is normal.

## 2016-12-22 ENCOUNTER — Encounter: Payer: BLUE CROSS/BLUE SHIELD | Admitting: Physical Therapy

## 2016-12-22 NOTE — Telephone Encounter (Signed)
Spoke with patient. Advised of message as seen below from [place]. Patient is very frustrated. Stating "I know I am going to get a yeast infection after I take an antibiotic. I know my body." Advised if symptoms occur after completion of medication she may contact the office to discuss medication options. Patient verbalizes understanding.  Routing to provider for final review. Patient agreeable to disposition. Will close encounter.

## 2016-12-26 ENCOUNTER — Ambulatory Visit: Payer: BLUE CROSS/BLUE SHIELD | Admitting: Physical Therapy

## 2016-12-26 ENCOUNTER — Encounter: Payer: Self-pay | Admitting: Physical Therapy

## 2016-12-26 DIAGNOSIS — M25512 Pain in left shoulder: Secondary | ICD-10-CM

## 2016-12-26 DIAGNOSIS — R293 Abnormal posture: Secondary | ICD-10-CM

## 2016-12-26 DIAGNOSIS — M545 Low back pain, unspecified: Secondary | ICD-10-CM

## 2016-12-26 DIAGNOSIS — M546 Pain in thoracic spine: Secondary | ICD-10-CM | POA: Diagnosis not present

## 2016-12-26 DIAGNOSIS — M25511 Pain in right shoulder: Secondary | ICD-10-CM
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
## 2016-12-26 NOTE — Patient Instructions (Signed)
Resistive Band Rowing    With resistive band anchored in door, grasp both ends. Keeping elbows bent, pull back, squeezing shoulder blades together. Hold __5__ seconds. Repeat __10-15__ times.   Strengthening: Resisted Internal Rotation    Hold tubing in left hand, elbow at side and forearm out. Rotate forearm in across body. Repeat __10-15__ times per set.   Strengthening: Resisted External Rotation    Hold tubing in right hand, elbow at side and forearm across body. Rotate forearm out. Repeat __10-15__ times per set.

## 2016-12-28 ENCOUNTER — Ambulatory Visit: Payer: BLUE CROSS/BLUE SHIELD | Admitting: Physical Therapy

## 2016-12-28 ENCOUNTER — Encounter: Payer: Self-pay | Admitting: Physical Therapy

## 2016-12-28 DIAGNOSIS — R293 Abnormal posture: Secondary | ICD-10-CM | POA: Diagnosis not present

## 2016-12-28 DIAGNOSIS — M25512 Pain in left shoulder: Secondary | ICD-10-CM | POA: Diagnosis not present

## 2016-12-28 DIAGNOSIS — M546 Pain in thoracic spine: Secondary | ICD-10-CM | POA: Diagnosis not present

## 2016-12-28 DIAGNOSIS — M545 Low back pain, unspecified: Secondary | ICD-10-CM

## 2016-12-28 DIAGNOSIS — M25511 Pain in right shoulder: Secondary | ICD-10-CM
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
## 2016-12-28 NOTE — Patient Instructions (Signed)
External Rotation: Side-Lying (Dumbbell)   Lie with neck supported, left elbow bent to 90, forearm across stomach. Raise forearm, keeping elbow at side. Repeat _10-15___ times per set. Do __2__ sets per session.   Abduction - Side-Lying (Dumbbell)   Lie with neck supported, right arm on hip. Lift straight arm toward ceiling. Repeat __10-15__ times per set. Do __2__ sets per session.

## 2016-12-29 ENCOUNTER — Encounter: Payer: BLUE CROSS/BLUE SHIELD | Admitting: Physical Therapy

## 2016-12-29 ENCOUNTER — Telehealth: Payer: Self-pay | Admitting: Certified Nurse Midwife

## 2016-12-29 MED ORDER — FLUCONAZOLE 150 MG PO TABS: 150.0000 mg | ORAL_TABLET | Freq: Once | ORAL | 0 refills | Status: AC

## 2016-12-29 NOTE — Telephone Encounter (Signed)
Patient is having a reaction to an antibiotic medication.

## 2016-12-29 NOTE — Telephone Encounter (Signed)
Spoke with patient. Patient states that she completed course of Flagyl for BV. Patient is now having vaginal itching and thick white discharge. Requesting Diflucan for relief. [facility] on file is correct.  Melvia Heaps CNM, okay to send in Diflucan 150 mg po x 1, repeat in 72 hours if symptoms persist?

## 2016-12-29 NOTE — Telephone Encounter (Signed)
Spoke with patient. Advised rx for Diflucan 150 mg po x 1 repeat in 72 hours if symptoms persist #2 0RF has been sent to her pharmacy on file. Patient is agreeable. Encounter closed.

## 2016-12-29 NOTE — Telephone Encounter (Signed)
yes

## 2017-01-01 ENCOUNTER — Encounter: Payer: BLUE CROSS/BLUE SHIELD | Admitting: Physical Therapy

## 2017-01-03 ENCOUNTER — Encounter: Payer: BLUE CROSS/BLUE SHIELD | Admitting: Physical Therapy

## 2017-01-09 ENCOUNTER — Ambulatory Visit: Payer: BLUE CROSS/BLUE SHIELD | Admitting: Physical Therapy

## 2017-01-09 ENCOUNTER — Encounter: Payer: Self-pay | Admitting: Physical Therapy

## 2017-01-09 DIAGNOSIS — M545 Low back pain, unspecified: Secondary | ICD-10-CM

## 2017-01-09 DIAGNOSIS — M546 Pain in thoracic spine: Secondary | ICD-10-CM | POA: Diagnosis not present

## 2017-01-09 DIAGNOSIS — M25512 Pain in left shoulder: Secondary | ICD-10-CM

## 2017-01-09 DIAGNOSIS — M25511 Pain in right shoulder: Secondary | ICD-10-CM | POA: Diagnosis not present

## 2017-01-09 DIAGNOSIS — R293 Abnormal posture: Secondary | ICD-10-CM

## 2017-01-09 NOTE — Patient Instructions (Signed)
Hamstring Step 2   Left foot relaxed, knee straight, other leg bent, foot flat. Raise straight leg further upward to maximal range. Hold __30_ seconds. Relax leg completely down. Repeat _3__ times.  Knee to Chest   Lying supine, bend involved knee to chest _3-5__ times. Repeat with other leg. Do __2_ times per day. Hold 15-30 seconds.  Bridge   Lie back, legs bent. Inhale, pressing hips up. Keeping ribs in, lengthen lower back. Exhale, rolling down along spine from top. Repeat __10-15__ times.
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
## 2017-01-11 ENCOUNTER — Encounter: Payer: Self-pay | Admitting: Physical Therapy

## 2017-01-11 ENCOUNTER — Ambulatory Visit: Payer: BLUE CROSS/BLUE SHIELD | Admitting: Physical Therapy

## 2017-01-11 DIAGNOSIS — M25512 Pain in left shoulder: Secondary | ICD-10-CM | POA: Diagnosis not present

## 2017-01-11 DIAGNOSIS — M545 Low back pain, unspecified: Secondary | ICD-10-CM

## 2017-01-11 DIAGNOSIS — M25511 Pain in right shoulder: Secondary | ICD-10-CM | POA: Diagnosis not present

## 2017-01-11 DIAGNOSIS — R293 Abnormal posture: Secondary | ICD-10-CM

## 2017-01-11 DIAGNOSIS — M546 Pain in thoracic spine: Secondary | ICD-10-CM
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
## 2017-01-12 ENCOUNTER — Encounter: Payer: Self-pay | Admitting: Family Medicine

## 2017-01-12 ENCOUNTER — Encounter: Payer: BLUE CROSS/BLUE SHIELD | Admitting: Physical Therapy

## 2017-01-15 ENCOUNTER — Ambulatory Visit: Payer: BLUE CROSS/BLUE SHIELD | Attending: Family Medicine | Admitting: Physical Therapy

## 2017-01-15 DIAGNOSIS — M25512 Pain in left shoulder: Secondary | ICD-10-CM | POA: Insufficient documentation

## 2017-01-15 DIAGNOSIS — M546 Pain in thoracic spine: Secondary | ICD-10-CM | POA: Insufficient documentation

## 2017-01-15 DIAGNOSIS — M25511 Pain in right shoulder: Secondary | ICD-10-CM | POA: Insufficient documentation

## 2017-01-15 DIAGNOSIS — M545 Low back pain: Secondary | ICD-10-CM | POA: Insufficient documentation

## 2017-01-15 DIAGNOSIS — R293 Abnormal posture: Secondary | ICD-10-CM | POA: Insufficient documentation

## 2017-01-18 ENCOUNTER — Ambulatory Visit: Payer: BLUE CROSS/BLUE SHIELD | Admitting: Physical Therapy

## 2017-01-18 DIAGNOSIS — M545 Low back pain, unspecified: Secondary | ICD-10-CM

## 2017-01-18 DIAGNOSIS — R293 Abnormal posture: Secondary | ICD-10-CM | POA: Diagnosis not present

## 2017-01-18 DIAGNOSIS — M546 Pain in thoracic spine: Secondary | ICD-10-CM | POA: Diagnosis not present

## 2017-01-18 DIAGNOSIS — M25511 Pain in right shoulder: Secondary | ICD-10-CM | POA: Diagnosis not present

## 2017-01-18 DIAGNOSIS — M25512 Pain in left shoulder: Secondary | ICD-10-CM | POA: Diagnosis not present
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
## 2017-01-18 NOTE — Patient Instructions (Signed)
Resisted Horizontal Abduction: Bilateral    Sit or stand, tubing in both hands, arms out in front. Keeping arms straight, pinch shoulder blades together and stretch arms out. Repeat __10-15__ times per set.   Resisted External Rotation: in Neutral - Bilateral    Sit or stand, tubing in both hands, elbows at sides, bent to 90, forearms forward. Pinch shoulder blades together and rotate forearms out. Keep elbows at sides. Repeat __10-15__ times per set.  **palms up and rotate wrists from each other**

## 2017-01-21 ENCOUNTER — Encounter: Payer: Self-pay | Admitting: Physical Therapy

## 2017-01-23 ENCOUNTER — Ambulatory Visit: Payer: BLUE CROSS/BLUE SHIELD | Admitting: Physical Therapy

## 2017-01-25 ENCOUNTER — Encounter: Payer: Self-pay | Admitting: Physical Therapy

## 2017-01-25 ENCOUNTER — Ambulatory Visit: Payer: BLUE CROSS/BLUE SHIELD | Admitting: Physical Therapy

## 2017-01-25 DIAGNOSIS — R293 Abnormal posture: Secondary | ICD-10-CM

## 2017-01-25 DIAGNOSIS — M546 Pain in thoracic spine: Secondary | ICD-10-CM | POA: Diagnosis not present

## 2017-01-25 DIAGNOSIS — M25511 Pain in right shoulder: Secondary | ICD-10-CM

## 2017-01-25 DIAGNOSIS — M25512 Pain in left shoulder: Secondary | ICD-10-CM

## 2017-01-25 DIAGNOSIS — M545 Low back pain, unspecified: Secondary | ICD-10-CM

## 2017-01-25 NOTE — Patient Instructions (Signed)
Pelvic Tilt: Posterior - Legs Bent (Supine)   Tighten stomach and flatten back by rolling pelvis down. Hold __5__ seconds. Relax. Repeat _10-15___ times per set.  - add in lower leg alternating marching - add in knee outs   Bilateral Isometric Hip Flexion   Tighten stomach and raise both knees to outstretched arms. Push gently, keeping arms straight, trunk rigid. Hold __5-10__ seconds. Repeat _15___ times per set.   Knee to Chest (Flexion)   Pull knee toward chest. Feel stretch in lower back or buttock area. Breathing deeply, Hold __20__ seconds. Repeat with other knee. Repeat _3-5___ times.   Piriformis Stretch, Supine   Lie supine, one ankle crossed onto opposite knee. Holding bottom leg behind knee, gently pull legs toward chest until stretch is felt in buttock of top leg. Hold _20__ seconds. For deeper stretch gently push top knee away from body.  Repeat _3__ times per session.
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
## 2017-01-26 ENCOUNTER — Ambulatory Visit: Payer: BLUE CROSS/BLUE SHIELD | Admitting: Family Medicine

## 2017-01-26 ENCOUNTER — Encounter: Payer: Self-pay | Admitting: Family Medicine

## 2017-01-26 VITALS — BP 122/82 | HR 93 | Temp 98.5°F | Resp 16 | Ht 68.0 in

## 2017-01-26 DIAGNOSIS — M545 Low back pain, unspecified: Secondary | ICD-10-CM

## 2017-01-26 DIAGNOSIS — J0140 Acute pansinusitis, unspecified: Secondary | ICD-10-CM | POA: Diagnosis not present

## 2017-01-26 MED ORDER — AMOXICILLIN 875 MG PO TABS: 875.0000 mg | ORAL_TABLET | Freq: Two times a day (BID) | ORAL | 0 refills | Status: DC

## 2017-01-26 NOTE — Patient Instructions (Signed)
Take the antibiotic as prescribed and continue treating her symptoms.  If you are not back to baseline after completing the antibiotic let me know. Make sure you are staying well-hydrated.  Since you are not back to your normal state of health in regards to low back pain, I recommend that you follow-up with your orthopedist for further evaluation.    Sinusitis, Adult Sinusitis is soreness and inflammation of your sinuses. Sinuses are hollow spaces in the bones around your face. Your sinuses are located:  Around your eyes.  In the middle of your forehead.  Behind your nose.  In your cheekbones.  Your sinuses and nasal passages are lined with a stringy fluid (mucus). Mucus normally drains out of your sinuses. When your nasal tissues become inflamed or swollen, the mucus can become trapped or blocked so air cannot flow through your sinuses. This allows bacteria, viruses, and funguses to grow, which leads to infection. Sinusitis can develop quickly and last for 7?10 days (acute) or for more than 12 weeks (chronic). Sinusitis often develops after a cold. What are the causes? This condition is caused by anything that creates swelling in the sinuses or stops mucus from draining, including:  Allergies.  Asthma.  Bacterial or viral infection.  Abnormally shaped bones between the nasal passages.  Nasal growths that contain mucus (nasal polyps).  Narrow sinus openings.  Pollutants, such as chemicals or irritants in the air.  A foreign object stuck in the nose.  A fungal infection. This is rare.  What increases the risk? The following factors may make you more likely to develop this condition:  Having allergies or asthma.  Having had a recent cold or respiratory tract infection.  Having structural deformities or blockages in your nose or sinuses.  Having a weak immune system.  Doing a lot of swimming or diving.  Overusing nasal sprays.  Smoking.  What are the signs or  symptoms? The main symptoms of this condition are pain and a feeling of pressure around the affected sinuses. Other symptoms include:  Upper toothache.  Earache.  Headache.  Bad breath.  Decreased sense of smell and taste.  A cough that may get worse at night.  Fatigue.  Fever.  Thick drainage from your nose. The drainage is often green and it may contain pus (purulent).  Stuffy nose or congestion.  Postnasal drip. This is when extra mucus collects in the throat or back of the nose.  Swelling and warmth over the affected sinuses.  Sore throat.  Sensitivity to light.  How is this diagnosed? This condition is diagnosed based on symptoms, a medical history, and a physical exam. To find out if your condition is acute or chronic, your health care provider may:  Look in your nose for signs of nasal polyps.  Tap over the affected sinus to check for signs of infection.  View the inside of your sinuses using an imaging device that has a light attached (endoscope).  If your health care provider suspects that you have chronic sinusitis, you may also:  Be tested for allergies.  Have a sample of mucus taken from your nose (nasal culture) and checked for bacteria.  Have a mucus sample examined to see if your sinusitis is related to an allergy.  If your sinusitis does not respond to treatment and it lasts longer than 8 weeks, you may have an MRI or CT scan to check your sinuses. These scans also help to determine how severe your infection is. In rare  cases, a bone biopsy may be done to rule out more serious types of fungal sinus disease. How is this treated? Treatment for sinusitis depends on the cause and whether your condition is chronic or acute. If a virus is causing your sinusitis, your symptoms will go away on their own within 10 days. You may be given medicines to relieve your symptoms, including:  Topical nasal decongestants. They shrink swollen nasal passages and let  mucus drain from your sinuses.  Antihistamines. These drugs block inflammation that is triggered by allergies. This can help to ease swelling in your nose and sinuses.  Topical nasal corticosteroids. These are nasal sprays that ease inflammation and swelling in your nose and sinuses.  Nasal saline washes. These rinses can help to get rid of thick mucus in your nose.  If your condition is caused by bacteria, you will be given an antibiotic medicine. If your condition is caused by a fungus, you will be given an antifungal medicine. Surgery may be needed to correct underlying conditions, such as narrow nasal passages. Surgery may also be needed to remove polyps. Follow these instructions at home: Medicines  Take, use, or apply over-the-counter and prescription medicines only as told by your health care provider. These may include nasal sprays.  If you were prescribed an antibiotic medicine, take it as told by your health care provider. Do not stop taking the antibiotic even if you start to feel better. Hydrate and Humidify  Drink enough water to keep your urine clear or pale yellow. Staying hydrated will help to thin your mucus.  Use a cool mist humidifier to keep the humidity level in your home above 50%.  Inhale steam for 10-15 minutes, 3-4 times a day or as told by your health care provider. You can do this in the bathroom while a hot shower is running.  Limit your exposure to cool or dry air. Rest  Rest as much as possible.  Sleep with your head raised (elevated).  Make sure to get enough sleep each night. General instructions  Apply a warm, moist washcloth to your face 3-4 times a day or as told by your health care provider. This will help with discomfort.  Wash your hands often with soap and water to reduce your exposure to viruses and other germs. If soap and water are not available, use hand sanitizer.  Do not smoke. Avoid being around people who are smoking (secondhand  smoke).  Keep all follow-up visits as told by your health care provider. This is important. Contact a health care provider if:  You have a fever.  Your symptoms get worse.  Your symptoms do not improve within 10 days. Get help right away if:  You have a severe headache.  You have persistent vomiting.  You have pain or swelling around your face or eyes.  You have vision problems.  You develop confusion.  Your neck is stiff.  You have trouble breathing. This information is not intended to replace advice given to you by your health care provider. Make sure you discuss any questions you have with your health care provider. Document Released: 01/30/2005 Document Revised: 09/26/2015 Document Reviewed: 11/25/2014 Elsevier Interactive Patient Education  2017 [employer].
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
## 2017-01-30 ENCOUNTER — Other Ambulatory Visit: Payer: Self-pay | Admitting: Certified Nurse Midwife

## 2017-01-30 DIAGNOSIS — Z1231 Encounter for screening mammogram for malignant neoplasm of breast: Secondary | ICD-10-CM

## 2017-02-12 ENCOUNTER — Ambulatory Visit (INDEPENDENT_AMBULATORY_CARE_PROVIDER_SITE_OTHER): Payer: BLUE CROSS/BLUE SHIELD | Admitting: Orthopedic Surgery

## 2017-02-20 ENCOUNTER — Encounter (INDEPENDENT_AMBULATORY_CARE_PROVIDER_SITE_OTHER): Payer: Self-pay | Admitting: Orthopedic Surgery

## 2017-02-20 ENCOUNTER — Ambulatory Visit (INDEPENDENT_AMBULATORY_CARE_PROVIDER_SITE_OTHER): Payer: BLUE CROSS/BLUE SHIELD | Admitting: Orthopedic Surgery

## 2017-02-20 DIAGNOSIS — M170 Bilateral primary osteoarthritis of knee: Secondary | ICD-10-CM | POA: Diagnosis not present

## 2017-02-20 DIAGNOSIS — S300XXS Contusion of lower back and pelvis, sequela: Secondary | ICD-10-CM | POA: Diagnosis not present

## 2017-02-20 DIAGNOSIS — S300XXA Contusion of lower back and pelvis, initial encounter: Secondary | ICD-10-CM | POA: Insufficient documentation

## 2017-02-20 MED ORDER — PREDNISONE 10 MG PO TABS: 10.0000 mg | ORAL_TABLET | Freq: Every day | ORAL | 1 refills | Status: DC
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
## 2017-02-21 ENCOUNTER — Encounter (INDEPENDENT_AMBULATORY_CARE_PROVIDER_SITE_OTHER): Payer: Self-pay | Admitting: Orthopedic Surgery

## 2017-02-22 ENCOUNTER — Ambulatory Visit
Admission: RE | Admit: 2017-02-22 | Discharge: 2017-02-22 | Disposition: A | Discharge status: Home / Self Care | Payer: BLUE CROSS/BLUE SHIELD | Source: Ambulatory Visit | Attending: Certified Nurse Midwife | Admitting: Certified Nurse Midwife

## 2017-02-22 DIAGNOSIS — Z1231 Encounter for screening mammogram for malignant neoplasm of breast: Secondary | ICD-10-CM

## 2017-03-29 ENCOUNTER — Telehealth (INDEPENDENT_AMBULATORY_CARE_PROVIDER_SITE_OTHER): Payer: Self-pay | Admitting: Orthopedic Surgery

## 2017-03-29 NOTE — Telephone Encounter (Signed)
02/20/2017 OV note faxed to Quentin Mulling [phone]

## 2017-05-04 ENCOUNTER — Telehealth (INDEPENDENT_AMBULATORY_CARE_PROVIDER_SITE_OTHER): Payer: Self-pay | Admitting: Orthopedic Surgery

## 2017-05-04 NOTE — Telephone Encounter (Signed)
Patient requesting bilateral knee injections, she said Dr. Sharol Given told her to call when she needed them. Appt made for 4/1 at 3:15.

## 2017-05-14 ENCOUNTER — Encounter (INDEPENDENT_AMBULATORY_CARE_PROVIDER_SITE_OTHER): Payer: Self-pay | Admitting: Orthopedic Surgery

## 2017-05-14 ENCOUNTER — Ambulatory Visit (INDEPENDENT_AMBULATORY_CARE_PROVIDER_SITE_OTHER): Payer: BLUE CROSS/BLUE SHIELD | Admitting: Orthopedic Surgery

## 2017-05-14 ENCOUNTER — Other Ambulatory Visit: Payer: Self-pay | Admitting: Family Medicine

## 2017-05-14 VITALS — Ht 68.0 in | Wt 215.0 lb

## 2017-05-14 DIAGNOSIS — R0781 Pleurodynia: Secondary | ICD-10-CM

## 2017-05-14 DIAGNOSIS — M170 Bilateral primary osteoarthritis of knee: Secondary | ICD-10-CM | POA: Diagnosis not present

## 2017-05-14 DIAGNOSIS — S3992XA Unspecified injury of lower back, initial encounter: Secondary | ICD-10-CM

## 2017-05-14 NOTE — Progress Notes (Signed)
Office Visit Note   Patient: Crystal Simmons           Date of Birth: March 14, 1960           MRN: [id number] Visit Date: 05/14/2017              Requested by: Denita Lung, MD [address] [place], [place] [address] PCP: Denita Lung, MD  Chief Complaint  Patient presents with  . Left Knee - Follow-up    S/p injections 05/18/16  . Right Knee - Follow-up      HPI: Patient is a 57 year old woman with osteoarthritis of both knees.  She has had good relief with steroid injections in the past and presents at this time for repeat injection.  Her last injection was a year ago.  Assessment & Plan: Visit Diagnoses:  1. Bilateral primary osteoarthritis of knee     Plan: Knees were injected she tolerated this well follow-up as needed.  Follow-Up Instructions: Return if symptoms worsen or fail to improve.   Ortho Exam  Patient is alert, oriented, no adenopathy, well-dressed, normal affect, normal respiratory effort. Examination patient does have an antalgic gait.  She is crepitation range of motion of her knees collaterals and cruciates are stable she is tender primarily over the medial joint line bilaterally there is a mild effusion bilaterally.  Imaging: No results found. No images are attached to the encounter.  Labs: Lab Results  Component Value Date   HGBA1C 5.4 12/13/2015   LABORGA NO GROWTH 02/29/2016    @LABSALLVALUES (HGBA1)@  Body mass index is 32.69 kg/m.  Orders:  Orders Placed This Encounter  Procedures  . Large Joint Inj   No orders of the defined types were placed in this encounter.    Procedures: Large Joint Inj: bilateral knee on 05/14/2017 4:18 PM Indications: pain and diagnostic evaluation Details: 22 G 1.5 in needle, anteromedial approach  Arthrogram: No  Outcome: tolerated well, no immediate complications Procedure, treatment alternatives, risks and benefits explained, specific risks discussed. Consent was given by the patient.  Immediately prior to procedure a time out was called to verify the correct patient, procedure, equipment, support staff and site/side marked as required. Patient was prepped and draped in the usual sterile fashion.      Clinical Data: No additional findings.  ROS:  All other systems negative, except as noted in the HPI. Review of Systems  Objective: Vital Signs: Ht 5\' 8"  (1.727 m)   Wt 215 lb (97.5 kg)   LMP 10/14/2005 (Approximate)   BMI 32.69 kg/m   Specialty Comments:  No specialty comments available.  PMFS History: Patient Active Problem List   Diagnosis Date Noted  . Contusion of sacrum 02/20/2017  . Bilateral primary osteoarthritis of knee 05/18/2016  . Post-menopausal atrophic vaginitis 02/29/2016   Past Medical History:  Diagnosis Date  . 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 Endometriosis   . Fibroids 10/95  . 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 Insomnia   . Menopausal state 2007  . STD (sexually transmitted disease) 8/09   chlamydia  . Vitamin D deficiency disease 2010    Family History  Problem Relation Age of Onset  . Hypertension Mother   . Diabetes Father   . Hypertension Father   . Diabetes Brother   . Diabetes Paternal Grandfather     Past Surgical History:  Procedure Laterality Date  . BREAST SURGERY Left 6/03   Biopsy negative  . CERVIX  LESION DESTRUCTION  4/92  . COLONOSCOPY W/ BIOPSIES  09/26/10   polyp benign  . COLPOSCOPY  10/12   CIN I  . KNEE SURGERY  12/2009   left for meniscal tear  . LAPAROSCOPY  7/02   multiple surgeries, including LSO, LOA, and endometrioma, and fibroids secondary to endometriosis  . UMBILICAL EXPLORATION  3/71   for endometrioma    Social History   Occupational History  . 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 Smoking status: Never Smoker  . Smokeless tobacco: Never Used  Substance and Sexual Activity  . Alcohol use: Yes    Comment: occ    . Drug use: No  . Sexual activity: Yes    Partners: Male    Birth control/protection: Post-menopausal

## 2017-05-15 NOTE — Telephone Encounter (Signed)
Pt states she needs this medicine for her back spasms. She uses this med like once a week usually. Please send in refill

## 2017-05-16 ENCOUNTER — Other Ambulatory Visit: Payer: Self-pay | Admitting: Certified Nurse Midwife

## 2017-07-11 ENCOUNTER — Encounter: Payer: Self-pay | Admitting: Family Medicine

## 2017-07-11 ENCOUNTER — Ambulatory Visit: Payer: BLUE CROSS/BLUE SHIELD | Admitting: Family Medicine

## 2017-07-11 VITALS — BP 128/80 | HR 80 | Temp 98.0°F | Resp 18 | Ht 68.0 in | Wt 210.0 lb

## 2017-07-11 DIAGNOSIS — J0100 Acute maxillary sinusitis, unspecified: Secondary | ICD-10-CM | POA: Diagnosis not present

## 2017-07-11 DIAGNOSIS — H6123 Impacted cerumen, bilateral: Secondary | ICD-10-CM

## 2017-07-11 MED ORDER — AMOXICILLIN-POT CLAVULANATE 875-125 MG PO TABS: 1.0000 | ORAL_TABLET | Freq: Two times a day (BID) | ORAL | 0 refills | Status: DC

## 2017-07-11 NOTE — Patient Instructions (Signed)
Try adding a decongestant to help with your symptoms such as Zyrtec-D.   If you are not back to baseline after completing the antibiotic then let me know.

## 2017-07-11 NOTE — Progress Notes (Signed)
Chief Complaint  Patient presents with  . Ear Fullness    x 1 week, feels like she is in a tunnel. Also has some sinus pain and pressure.    Subjective:  Crystal Simmons is a 57 y.o. female who presents for a one week history of bilateral ear pain, sinus pressure that is worse on the left side. Purulent nasal drainage yesterday, post nasal drainage and scratchy throat.   Has underlying allergies and is taking Flonase and Zyrtec.  Does not smoke.   History of sinus infections. Last one in January.   Denies fever, chills, headache, cough, chest pain, shortness of breath, abdominal pain, N/V/D.   Treatment to date: antihistamines, nasal steroids and Motrin.  Denies sick contacts.  No other aggravating or relieving factors.  No other c/o.  ROS as in subjective.   Objective: Vitals:   07/11/17 0821  BP: 128/80  Pulse: 80  Resp: 18  Temp: 98 F (36.7 C)    General appearance: Alert, WD/WN, no distress, mildly ill appearing                             Skin: warm, no rash                           Head: maxillary sinus tenderness L>R                            Eyes: conjunctiva normal, corneas clear, PERRLA                            Ears: pearly TMs, external ear canals normal                          Nose: septum midline, turbinates swollen L>R, with erythema and thick discharge             Mouth/throat: MMM, tongue normal, mild pharyngeal erythema without edema or exudate                            Neck: supple, no adenopathy, no thyromegaly, nontender                          Heart: RRR, normal S1, S2, no murmurs                         Lungs: CTA bilaterally, no wheezes, rales, or rhonchi      Assessment: Acute maxillary sinusitis, recurrence not specified - Plan: amoxicillin-clavulanate (AUGMENTIN) 875-125 MG tablet  Bilateral impacted cerumen    Plan: Discussed diagnosis and treatment of acute sinusitis and allergies. Augmentin prescribed.  Advised to eat yogurt or take a  probiotic daily.  She will continue with Flonase and Zyrtec for allergies.  Recommend adding a decongestant to her regimen for now.   Ear lavage performed by Verdene Lennert, CMA, due to bilateral cerumen impaction. Patient tolerated this well and reported significant improvement in symptoms post lavage. She will call or return if not back to baseline after completing the antibiotic.

## 2017-08-17 ENCOUNTER — Ambulatory Visit: Payer: BLUE CROSS/BLUE SHIELD | Admitting: Certified Nurse Midwife

## 2017-08-17 ENCOUNTER — Encounter: Payer: Self-pay | Admitting: Certified Nurse Midwife

## 2017-08-17 VITALS — BP 138/86 | HR 82 | Temp 98.7°F | Ht 68.0 in

## 2017-08-17 DIAGNOSIS — N951 Menopausal and female climacteric states: Secondary | ICD-10-CM

## 2017-08-17 DIAGNOSIS — N390 Urinary tract infection, site not specified: Secondary | ICD-10-CM | POA: Diagnosis not present

## 2017-08-17 DIAGNOSIS — Z113 Encounter for screening for infections with a predominantly sexual mode of transmission: Secondary | ICD-10-CM | POA: Diagnosis not present

## 2017-08-17 LAB — POCT URINALYSIS DIPSTICK
Bilirubin, UA: NEGATIVE
Blood, UA: NEGATIVE
CLARITY UA: NEGATIVE
COLOR UA: NEGATIVE
Glucose, UA: NEGATIVE
KETONES UA: NEGATIVE
NITRITE UA: NEGATIVE
PROTEIN UA: NEGATIVE
Urobilinogen, UA: NEGATIVE E.U./dL — AB
pH, UA: 5 (ref 5.0–8.0)

## 2017-08-17 MED ORDER — NITROFURANTOIN MONOHYD MACRO 100 MG PO CAPS: ORAL_CAPSULE | ORAL | 0 refills | Status: DC

## 2017-08-17 NOTE — Progress Notes (Signed)
57 y.o. Single African American female G0P0000 here with complaint of ? UTI, with onset  on 3-4 days ago. Started on Azo for 3 days with some relief. Patient also dealing with vaginal dryness and using Estrace cream twice weekly and tried coconut oil with still some discomfort. No partner change, but would like STD screening. Patient complaining of urinary frequency/urgency/ which has improved some at  and pain with urination. Patient denies fever, chills, nausea or back pain. No new personal products. Patient feels related to sexual activity. Denies any other vaginal symptoms.   Menopausal no vaginal bleeding Patient consuming adequate water intake. No other health issues today.  Review of Systems  Constitutional: Negative.   HENT: Negative.   Eyes: Negative.   Respiratory: Negative.   Cardiovascular: Negative.   Gastrointestinal: Negative.   Genitourinary: Positive for dysuria, frequency and urgency. Negative for hematuria.       Vaginal  Itching and dryness  Musculoskeletal: Negative.   Skin: Negative.   Neurological: Negative.   Endo/Heme/Allergies: Negative.   Psychiatric/Behavioral: Negative.   All other systems reviewed and are negative.   O: Healthy female WDWN Affect: Normal, orientation x 3 Skin : warm and dry CVAT: negative bilateral Abdomen: positive for suprapubic tenderness  Pelvic exam: External genital area: normal, no lesions, no scaling or exudate Bladder,Urethra tender, Urethral meatus: tender, red Vagina:scant vaginal discharge, slight atrophic  Appearance, no odor,  Cervix: normal, non tender Uterus:normal,non tender Adnexa: normal non tender, no fullness or masses   A: UTI Normal pelvic exam POCT urnine--WBC 1+ Vaginal dryness STD screening  P: Reviewed findings of UTI and need for treatment. Questions addressed regarding dryness can increase risk of UTI.  Rx: Macrobid see order with instructions EEF:EOFHQ culture Reviewed warning signs and symptoms of  UTI and need to advise if occurring. Encouraged to limit soda, tea, and coffee and be sure to increase water intake. Lab: Affirm, Gc/Chlamydia Discussed continue use of Estrace cream as directed and use coconut oil nightly and for sexual activity to see if this resolves the discomfort. Patient will advise if no change. Labs: Gc, Chlamydia, Affirm   RV prn

## 2017-08-17 NOTE — Patient Instructions (Signed)
Urinary Frequency, Adult Urinary frequency means urinating more often than usual. People with urinary frequency urinate at least 8 times in 24 hours, even if they drink a normal amount of fluid. Although they urinate more often than normal, the total amount of urine produced in a day may be normal. Urinary frequency is also called pollakiuria. What are the causes? This condition may be caused by:  A urinary tract infection.  Obesity.  Bladder problems, such as bladder stones.  Caffeine or alcohol.  Eating food or drinking fluids that irritate the bladder. These include coffee, tea, soda, artificial sweeteners, citrus, tomato-based foods, and chocolate.  Certain medicines, such as medicines that help the body get rid of extra fluid (diuretics).  Muscle or nerve weakness.  Overactive bladder.  Chronic diabetes.  Interstitial cystitis.  In men, problems with the prostate, such as an enlarged prostate.  In women, pregnancy.  In some cases, the cause may not be known. What increases the risk? This condition is more likely to develop in:  Women who have gone through menopause.  Men with prostate problems.  People with a disease or injury that affects the nerves or spinal cord.  People who have or have had a condition that affects the brain, such as a stroke.  What are the signs or symptoms? Symptoms of this condition include:  Feeling an urgent need to urinate often. The stress and anxiety of needing to find a bathroom quickly can make this urge worse.  Urinating 8 or more times in 24 hours.  Urinating as often as every 1 to 2 hours.  How is this diagnosed? This condition is diagnosed based on your symptoms, your medical history, and a physical exam. You may have tests, such as:  Blood tests.  Urine tests.  Imaging tests, such as X-rays or ultrasounds.  A bladder test.  A test of your neurological system. This is the body system that senses the need to  urinate.  A test to check for problems in the urethra and bladder called cystoscopy.  You may also be asked to keep a bladder diary. A bladder diary is a record of what you eat and drink, how often you urinate, and how much you urinate. You may need to see a health care provider who specializes in conditions of the urinary tract (urologist) or kidneys (nephrologist). How is this treated? Treatment for this condition depends on the cause. Sometimes the condition goes away on its own and treatment is not necessary. If treatment is needed, it may include:  Taking medicine.  Learning exercises that strengthen the muscles that help control urination.  Following a bladder training program. This may include: ? Learning to delay going to the bathroom. ? Double urinating (voiding). This helps if you are not completely emptying your bladder. ? Scheduled voiding.  Making diet changes, such as: ? Avoiding caffeine. ? Drinking fewer fluids, especially alcohol. ? Not drinking in the evening. ? Not having foods or drinks that may irritate the bladder. ? Eating foods that help prevent or ease constipation. Constipation can make this condition worse.  Having the nerves in your bladder stimulated. There are two options for stimulating the nerves to your bladder: ? Outpatient electrical nerve stimulation. This is done by your health care provider. ? Surgery to implant a bladder pacemaker. The pacemaker helps to control the urge to urinate.  Follow these instructions at home:  Keep a bladder diary if told to by your health care provider.  Take over-the-counter   and prescription medicines only as told by your health care provider.  Do any exercises as told by your health care provider.  Follow a bladder training program as told by your health care provider.  Make any recommended diet changes.  Keep all follow-up visits as told by your health care provider. This is important. Contact a health care  provider if:  You start urinating more often.  You feel pain or irritation when you urinate.  You notice blood in your urine.  Your urine looks cloudy.  You develop a fever.  You begin vomiting. Get help right away if:  You are unable to urinate. This information is not intended to replace advice given to you by your health care provider. Make sure you discuss any questions you have with your health care provider. Document Released: 11/26/2008 Document Revised: 03/03/2015 Document Reviewed: 08/26/2014 Elsevier Interactive Patient Education  2018 [employer]. Atrophic Vaginitis Atrophic vaginitis is when the tissues that line the vagina become dry and thin. This is caused by a drop in estrogen. Estrogen helps:  To keep the vagina moist.  To make a clear fluid that helps: ? To lubricate the vagina for sex. ? To protect the vagina from infection.  If the lining of the vagina is dry and thin, it may:  Make sex painful. It may also cause bleeding.  Cause a feeling of: ? Burning. ? Irritation. ? Itchiness.  Make an exam of your vagina painful. It may also cause bleeding.  Make you lose interest in sex.  Cause a burning feeling when you pee.  Make your vaginal fluid (discharge) brown or yellow.  For some women, there are no symptoms. This condition is most common in women who do not get their regular menstrual periods anymore (menopause). This often starts when a woman is 60-54 years old. Follow these instructions at home:  Take medicines only as told by your doctor. Do not use any herbal or alternative medicines unless your doctor says it is okay.  Use over-the-counter products for dryness only as told by your doctor. These include: ? Creams. ? Lubricants. ? Moisturizers.  Do not douche.  Do not use products that can make your vagina dry. These include: ? Scented feminine sprays. ? Scented tampons. ? Scented soaps.  If it hurts to have sex, tell your sexual  partner. Contact a doctor if:  Your discharge looks different than normal.  Your vagina has an unusual smell.  You have new symptoms.  Your symptoms do not get better with treatment.  Your symptoms get worse. This information is not intended to replace advice given to you by your health care provider. Make sure you discuss any questions you have with your health care provider. Document Released: 07/19/2007 Document Revised: 07/08/2015 Document Reviewed: 01/21/2014 Elsevier Interactive Patient Education  [employer].

## 2017-08-18 LAB — VAGINITIS/VAGINOSIS, DNA PROBE
CANDIDA SPECIES: NEGATIVE
GARDNERELLA VAGINALIS: NEGATIVE
Trichomonas vaginosis: NEGATIVE

## 2017-08-19 ENCOUNTER — Other Ambulatory Visit: Payer: Self-pay | Admitting: Certified Nurse Midwife

## 2017-08-19 ENCOUNTER — Encounter: Payer: Self-pay | Admitting: Certified Nurse Midwife

## 2017-08-19 DIAGNOSIS — N390 Urinary tract infection, site not specified: Secondary | ICD-10-CM

## 2017-08-19 LAB — GC/CHLAMYDIA PROBE AMP
CHLAMYDIA, DNA PROBE: NEGATIVE
Neisseria gonorrhoeae by PCR: NEGATIVE

## 2017-08-19 LAB — URINE CULTURE

## 2017-08-19 MED ORDER — CIPROFLOXACIN HCL 500 MG PO TABS: 500.0000 mg | ORAL_TABLET | Freq: Two times a day (BID) | ORAL | 0 refills | Status: DC

## 2017-08-20 NOTE — Telephone Encounter (Signed)
Reply to patient. Routing to [employer]. Encounter closed.

## 2018-01-04 ENCOUNTER — Other Ambulatory Visit: Payer: Self-pay | Admitting: Family Medicine

## 2018-01-04 DIAGNOSIS — R0781 Pleurodynia: Secondary | ICD-10-CM

## 2018-01-04 DIAGNOSIS — S3992XA Unspecified injury of lower back, initial encounter: Secondary | ICD-10-CM

## 2018-01-15 ENCOUNTER — Encounter: Payer: Self-pay | Admitting: Certified Nurse Midwife

## 2018-01-15 ENCOUNTER — Other Ambulatory Visit: Payer: Self-pay

## 2018-01-15 ENCOUNTER — Ambulatory Visit (INDEPENDENT_AMBULATORY_CARE_PROVIDER_SITE_OTHER): Payer: BLUE CROSS/BLUE SHIELD | Admitting: Certified Nurse Midwife

## 2018-01-15 VITALS — BP 110/64 | HR 68 | Resp 16

## 2018-01-15 DIAGNOSIS — N952 Postmenopausal atrophic vaginitis: Secondary | ICD-10-CM | POA: Diagnosis not present

## 2018-01-15 DIAGNOSIS — Z01419 Encounter for gynecological examination (general) (routine) without abnormal findings: Secondary | ICD-10-CM | POA: Diagnosis not present

## 2018-01-15 DIAGNOSIS — R5383 Other fatigue: Secondary | ICD-10-CM | POA: Diagnosis not present

## 2018-01-15 DIAGNOSIS — E559 Vitamin D deficiency, unspecified: Secondary | ICD-10-CM | POA: Diagnosis not present

## 2018-01-15 DIAGNOSIS — Z113 Encounter for screening for infections with a predominantly sexual mode of transmission: Secondary | ICD-10-CM | POA: Diagnosis not present

## 2018-01-15 MED ORDER — ESTRADIOL 0.1 MG/GM VA CREA: TOPICAL_CREAM | VAGINAL | 3 refills | Status: DC

## 2018-01-15 NOTE — Patient Instructions (Signed)

## 2018-01-15 NOTE — Progress Notes (Signed)
57 y.o. G0P0000 Single  African American Fe here for annual exam.  Denies vaginal  issues but would like to have STD screening. Estrace working well for dryness, desires continuance. Busy with new position. Has noted heart palpitations at times, that subside after a few minutes. Eating well and exercising daily. ? Stress with job responsibility. Denies SOB, pressure in chest area or jaw pain when occurs. Has PCP Dr. [name] Heck, but has not seen her in last year. Labs here to day if possible. No HSV outbreaks recently. No other health issues.  Patient's last menstrual period was 10/14/2005 (approximate).          Sexually active: Yes.    The current method of family planning is post menopausal status.    Exercising: Yes.    eliptical, weights & walking Smoker:  no  Review of Systems  Constitutional: Negative.   HENT: Negative.   Eyes: Negative.   Respiratory: Negative.   Cardiovascular: Positive for palpitations.  Gastrointestinal: Negative.   Genitourinary: Negative.   Musculoskeletal: Negative.   Skin: Negative.   Neurological: Negative.   Endo/Heme/Allergies: Negative.   Psychiatric/Behavioral: Negative.     Health Maintenance: Pap:  11-28-13 neg, 12-19-16 neg HPV HR neg History of Abnormal Pap: yes 1992 cryo MMG:  02-22-17 category b density birads 1:neg Self Breast exams: yes Colonoscopy:  2017 f/u 79yrs per patient, polyps BMD:   none TDaP:  2014 Shingles: no Pneumonia: no Hep C and HIV: HIV & Hep c neg 2018 Labs: yes   reports that she has never smoked. She has never used smokeless tobacco. She reports that she drinks alcohol. She reports that she does not use drugs.  Past Medical History:  Diagnosis Date  . 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 Endometriosis   . Fibroids 10/95  . 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 Insomnia   . Menopausal state 2007  . STD (sexually transmitted disease) 8/09   chlamydia  . Vitamin D deficiency disease 2010    Past  Surgical History:  Procedure Laterality Date  . BREAST SURGERY Left 6/03   Biopsy negative  . CERVIX LESION DESTRUCTION  4/92  . COLONOSCOPY W/ BIOPSIES  09/26/10   polyp benign  . COLPOSCOPY  10/12   CIN I  . KNEE SURGERY  12/2009   left for meniscal tear  . LAPAROSCOPY  7/02   multiple surgeries, including LSO, LOA, and endometrioma, and fibroids secondary to endometriosis  . UMBILICAL EXPLORATION  7/34   for endometrioma     Current Outpatient Medications  Medication Sig Dispense Refill  . cetirizine (ZYRTEC) 10 MG tablet Take 10 mg by mouth daily.    . Cholecalciferol (VITAMIN D PO) Take by mouth.    . ciprofloxacin (CIPRO) 500 MG tablet Take 1 tablet (500 mg total) by mouth 2 (two) times daily. 6 tablet 0  . estradiol (ESTRACE VAGINAL) 0.1 MG/GM vaginal cream Use twice weekly 42.5 g 3  . FERROUS SULFATE PO Take by mouth.    . fish oil-omega-3 fatty acids 1000 MG capsule Take 2 g by mouth daily.    . fluticasone (FLONASE) 50 MCG/ACT nasal spray Place 2 sprays into the nose daily.    Marland Kitchen ibuprofen (ADVIL,MOTRIN) 800 MG tablet TK 1 T PO Q 6 H FOR 10 DAYS PRN PAIN  0  . methocarbamol (ROBAXIN) 500 MG tablet as needed.  0  . Multiple Vitamins-Minerals (MULTIVITAMIN WITH MINERALS) tablet Take 1 tablet by mouth  daily.    . polyethylene glycol (MIRALAX / GLYCOLAX) packet Take 1 packet by mouth 3 (three) times a week.  11   No current facility-administered medications for this visit.     Family History  Problem Relation Age of Onset  . Hypertension Mother   . Diabetes Father   . Hypertension Father   . Diabetes Brother   . Diabetes Paternal Grandfather     ROS:  Pertinent items are noted in HPI.  Otherwise, a comprehensive ROS was negative.  Exam:   LMP 10/14/2005 (Approximate)    Ht Readings from Last 3 Encounters:  08/17/17 5\' 8"  (1.727 m)  07/11/17 5\' 8"  (1.727 m)  05/14/17 5\' 8"  (1.727 m)    General appearance: alert, cooperative and appears stated age Head:  Normocephalic, without obvious abnormality, atraumatic Neck: no adenopathy, supple, symmetrical, trachea midline and thyroid normal to inspection and palpation Lungs: clear to auscultation bilaterally Breasts: normal appearance, no masses or tenderness, No nipple retraction or dimpling, No nipple discharge or bleeding, No axillary or supraclavicular adenopathy Heart: regular rate and rhythm Abdomen: soft, non-tender; no masses,  no organomegaly Extremities: extremities normal, atraumatic, no cyanosis or edema Skin: Skin color, texture, turgor normal. No rashes or lesions Lymph nodes: Cervical, supraclavicular, and axillary nodes normal. No abnormal inguinal nodes palpated Neurologic: Grossly normal   Pelvic: External genitalia:  no lesions              Urethra:  normal appearing urethra with no masses, tenderness or lesions              Bartholin's and Skene's: normal                 Vagina: normal appearing vagina with normal color and discharge, no lesions              Cervix: no cervical motion tenderness and no lesions              Pap taken: No. Bimanual Exam:  Uterus:  normal size, contour, position, consistency, mobility, non-tender              Adnexa: normal adnexa and no mass, fullness, tenderness               Rectovaginal: Confirms               Anus:  normal sphincter tone, no lesions  Chaperone present: yes  A:  Well Woman with normal exam  Menopausal no HRT  Atrophic vaginitis with Estrace use with good results  Palpitations sporadic  Social stress with new position  Screening labs    P:   Reviewed health and wellness pertinent to exam  Aware to advise if vaginal bleeding  Discussed risks/benefits/warning signs with Estrace use and avoid use until palpitations evaluated.  Discussed need for evaluation with PCP or cardiology. Patient would rather see PCP Dr. [name] Heck and will schedule. Warning signs with palpitations given and need seek 911 or ER.   Discussed need to  decrease stress with job if possible. Patient will try.  Labs: CBC,  Vitamin D, Gc/Chlamlydia, Affirm,   Pap smear: no  counseled on breast self exam, mammography screening, STD prevention, HIV risk factors and prevention, adequate intake of calcium and vitamin D, diet and exercise  return annually or prn  An After Visit Summary was printed and given to the patient.

## 2018-01-16 ENCOUNTER — Other Ambulatory Visit: Payer: Self-pay | Admitting: Certified Nurse Midwife

## 2018-01-16 DIAGNOSIS — E559 Vitamin D deficiency, unspecified: Secondary | ICD-10-CM

## 2018-01-16 LAB — CBC
HEMATOCRIT: 37.3 % (ref 34.0–46.6)
Hemoglobin: 12.2 g/dL (ref 11.1–15.9)
MCH: 30.7 pg (ref 26.6–33.0)
MCHC: 32.7 g/dL (ref 31.5–35.7)
MCV: 94 fL (ref 79–97)
Platelets: 316 10*3/uL (ref 150–450)
RBC: 3.97 x10E6/uL (ref 3.77–5.28)
RDW: 12.9 % (ref 12.3–15.4)
WBC: 9.1 10*3/uL (ref 3.4–10.8)

## 2018-01-16 LAB — VAGINITIS/VAGINOSIS, DNA PROBE
Candida Species: NEGATIVE
Gardnerella vaginalis: NEGATIVE
TRICHOMONAS VAG: NEGATIVE

## 2018-01-16 LAB — GC/CHLAMYDIA PROBE AMP
CHLAMYDIA, DNA PROBE: NEGATIVE
Neisseria gonorrhoeae by PCR: NEGATIVE

## 2018-01-16 LAB — VITAMIN D 25 HYDROXY (VIT D DEFICIENCY, FRACTURES): Vit D, 25-Hydroxy: 24.5 ng/mL — ABNORMAL LOW (ref 30.0–100.0)

## 2018-02-14 ENCOUNTER — Ambulatory Visit: Payer: BLUE CROSS/BLUE SHIELD | Admitting: Certified Nurse Midwife

## 2018-02-14 ENCOUNTER — Other Ambulatory Visit: Payer: Self-pay

## 2018-02-14 ENCOUNTER — Encounter: Payer: Self-pay | Admitting: Certified Nurse Midwife

## 2018-02-14 VITALS — BP 118/68 | HR 68 | Temp 98.6°F | Resp 16

## 2018-02-14 DIAGNOSIS — N898 Other specified noninflammatory disorders of vagina: Secondary | ICD-10-CM | POA: Diagnosis not present

## 2018-02-14 DIAGNOSIS — N390 Urinary tract infection, site not specified: Secondary | ICD-10-CM

## 2018-02-14 DIAGNOSIS — R319 Hematuria, unspecified: Secondary | ICD-10-CM | POA: Diagnosis not present

## 2018-02-14 LAB — POCT URINALYSIS DIPSTICK
BILIRUBIN UA: NEGATIVE
Glucose, UA: NEGATIVE
KETONES UA: NEGATIVE
NITRITE UA: NEGATIVE
PH UA: 5 (ref 5.0–8.0)
Protein, UA: POSITIVE — AB
UROBILINOGEN UA: NEGATIVE U/dL — AB

## 2018-02-14 MED ORDER — NITROFURANTOIN MONOHYD MACRO 100 MG PO CAPS: 100.0000 mg | ORAL_CAPSULE | Freq: Two times a day (BID) | ORAL | 0 refills | Status: DC

## 2018-02-14 MED ORDER — PHENAZOPYRIDINE HCL 100 MG PO TABS: 100.0000 mg | ORAL_TABLET | Freq: Three times a day (TID) | ORAL | 0 refills | Status: DC | PRN

## 2018-02-14 NOTE — Progress Notes (Signed)
58 y.o. Single African American female G0P0000 here with complaint of UTI, with onset  on 3 days ago.. Patient complaining of urinary frequency/urgency/ and pain with urination. Patient denies fever, chills, nausea or back pain. No new personal products. Patient feels not related to sexual activity. Denies any vaginal symptoms. . Menopausal with vaginal dryness using coconut oil for dryness with good results.. Patient is adequate water intake. Has also used Pyridium with no change and came due to this. No other health issues today.  Review of Systems  Constitutional: Negative.  Negative for chills and fever.  HENT: Negative.   Eyes: Negative.   Respiratory: Negative.   Cardiovascular: Negative.   Gastrointestinal: Negative for abdominal pain and nausea.  Genitourinary: Positive for dysuria, frequency, hematuria and urgency.       Feels like UTI  Musculoskeletal: Negative.   Skin: Negative.   Neurological: Negative.   Endo/Heme/Allergies: Negative.   Psychiatric/Behavioral: Negative.     O: Healthy female WDWN Affect: Normal, orientation x 3 Skin : warm and dry CVAT: negative bilateral Abdomen: positive for suprapubic tenderness  Pelvic exam: External genital area: normal, no lesions Bladder,Urethra tender, Urethral meatus: tender, red Vagina: normal vaginal discharge, normal appearance  Affirm taken Cervix: normal, non tender Uterus:normal,non tender Adnexa: normal non tender, no fullness or masses   A: UTI Normal pelvic exam poct urine- rbc 1+, wbc 2+, protein 1+  P: Reviewed findings of UTI and need for treatment. Rx: Macrobid 100 mg bid x 7 see order with instructions Rx Pyridium 100 mg tid x 3 days if needed see order with instructions JSR:PRXYV micro, culture Reviewed warning signs and symptoms of UTI and need to advise if occurring. Encouraged to limit soda, tea, and coffee and be sure to increase water intake.   RV prn

## 2018-02-14 NOTE — Patient Instructions (Signed)
Urinary Tract Infection, Adult A urinary tract infection (UTI) is an infection of any part of the urinary tract. The urinary tract includes:  The kidneys.  The ureters.  The bladder.  The urethra. These organs make, store, and get rid of pee (urine) in the body. What are the causes? This is caused by germs (bacteria) in your genital area. These germs grow and cause swelling (inflammation) of your urinary tract. What increases the risk? You are more likely to develop this condition if:  You have a small, thin tube (catheter) to drain pee.  You cannot control when you pee or poop (incontinence).  You are female, and: ? You use these methods to prevent pregnancy: ? A medicine that kills sperm (spermicide). ? A device that blocks sperm (diaphragm). ? You have low levels of a female hormone (estrogen). ? You are pregnant.  You have genes that add to your risk.  You are sexually active.  You take antibiotic medicines.  You have trouble peeing because of: ? A prostate that is bigger than normal, if you are female. ? A blockage in the part of your body that drains pee from the bladder (urethra). ? A kidney stone. ? A nerve condition that affects your bladder (neurogenic bladder). ? Not getting enough to drink. ? Not peeing often enough.  You have other conditions, such as: ? Diabetes. ? A weak disease-fighting system (immune system). ? Sickle cell disease. ? Gout. ? Injury of the spine. What are the signs or symptoms? Symptoms of this condition include:  Needing to pee right away (urgently).  Peeing often.  Peeing small amounts often.  Pain or burning when peeing.  Blood in the pee.  Pee that smells bad or not like normal.  Trouble peeing.  Pee that is cloudy.  Fluid coming from the vagina, if you are female.  Pain in the belly or lower back. Other symptoms include:  Throwing up (vomiting).  No urge to eat.  Feeling mixed up (confused).  Being tired  and grouchy (irritable).  A fever.  Watery poop (diarrhea). How is this treated? This condition may be treated with:  Antibiotic medicine.  Other medicines.  Drinking enough water. Follow these instructions at home:  Medicines  Take over-the-counter and prescription medicines only as told by your doctor.  If you were prescribed an antibiotic medicine, take it as told by your doctor. Do not stop taking it even if you start to feel better. General instructions  Make sure you: ? Pee until your bladder is empty. ? Do not hold pee for a long time. ? Empty your bladder after sex. ? Wipe from front to back after pooping if you are a female. Use each tissue one time when you wipe.  Drink enough fluid to keep your pee pale yellow.  Keep all follow-up visits as told by your doctor. This is important. Contact a doctor if:  You do not get better after 1-2 days.  Your symptoms go away and then come back. Get help right away if:  You have very bad back pain.  You have very bad pain in your lower belly.  You have a fever.  You are sick to your stomach (nauseous).  You are throwing up. Summary  A urinary tract infection (UTI) is an infection of any part of the urinary tract.  This condition is caused by germs in your genital area.  There are many risk factors for a UTI. These include having a small, thin   tube to drain pee and not being able to control when you pee or poop.  Treatment includes antibiotic medicines for germs.  Drink enough fluid to keep your pee pale yellow. This information is not intended to replace advice given to you by your health care provider. Make sure you discuss any questions you have with your health care provider. Document Released: 07/19/2007 Document Revised: 08/09/2017 Document Reviewed: 08/09/2017 Elsevier Interactive Patient Education  2019 Elsevier Inc.  

## 2018-02-15 LAB — VAGINITIS/VAGINOSIS, DNA PROBE
Candida Species: NEGATIVE
Gardnerella vaginalis: NEGATIVE
Trichomonas vaginosis: NEGATIVE

## 2018-02-16 LAB — URINE CULTURE

## 2018-03-07 ENCOUNTER — Other Ambulatory Visit: Payer: Self-pay | Admitting: Certified Nurse Midwife

## 2018-03-07 DIAGNOSIS — Z1231 Encounter for screening mammogram for malignant neoplasm of breast: Secondary | ICD-10-CM

## 2018-04-01 ENCOUNTER — Ambulatory Visit
Admission: RE | Admit: 2018-04-01 | Discharge: 2018-04-01 | Disposition: A | Discharge status: Home / Self Care | Payer: BLUE CROSS/BLUE SHIELD | Source: Ambulatory Visit | Attending: Certified Nurse Midwife | Admitting: Certified Nurse Midwife

## 2018-04-01 DIAGNOSIS — Z1231 Encounter for screening mammogram for malignant neoplasm of breast: Secondary | ICD-10-CM

## 2018-04-14 DIAGNOSIS — B029 Zoster without complications: Secondary | ICD-10-CM

## 2018-04-14 HISTORY — DX: Zoster without complications: B02.9

## 2018-04-15 ENCOUNTER — Other Ambulatory Visit: Payer: BLUE CROSS/BLUE SHIELD

## 2018-05-01 DIAGNOSIS — K5900 Constipation, unspecified: Secondary | ICD-10-CM | POA: Diagnosis not present

## 2018-05-01 DIAGNOSIS — B029 Zoster without complications: Secondary | ICD-10-CM | POA: Diagnosis not present

## 2018-05-07 ENCOUNTER — Other Ambulatory Visit: Payer: BLUE CROSS/BLUE SHIELD

## 2018-09-19 ENCOUNTER — Other Ambulatory Visit: Payer: Self-pay

## 2018-09-19 ENCOUNTER — Ambulatory Visit (HOSPITAL_COMMUNITY)
Admission: EM | Admit: 2018-09-19 | Discharge: 2018-09-19 | Disposition: A | Discharge status: Home / Self Care | Payer: BC Managed Care – PPO | Attending: Physician Assistant | Admitting: Physician Assistant

## 2018-09-19 ENCOUNTER — Encounter (HOSPITAL_COMMUNITY): Payer: Self-pay

## 2018-09-19 DIAGNOSIS — S61213A Laceration without foreign body of left middle finger without damage to nail, initial encounter: Secondary | ICD-10-CM | POA: Diagnosis not present

## 2018-09-19 MED ORDER — FLUCONAZOLE 150 MG PO TABS: 150.0000 mg | ORAL_TABLET | Freq: Every day | ORAL | 1 refills | Status: DC

## 2018-09-19 MED ORDER — DOXYCYCLINE HYCLATE 100 MG PO CAPS: 100.0000 mg | ORAL_CAPSULE | Freq: Two times a day (BID) | ORAL | 0 refills | Status: DC

## 2018-09-19 NOTE — Discharge Instructions (Signed)
Return if any problems.

## 2018-09-19 NOTE — ED Provider Notes (Signed)
[facility]    CSN: [id number] Arrival date & time: 09/19/18  1634      History   Chief Complaint Chief Complaint  Patient presents with  . Fall  . Ankle Pain    HPI Crystal Simmons is a 58 y.o. female.   Pt reports she turned her ankle and fell on Sunday and injured her left ring and middle finger.  Pt reports her ankle is fine but she cut fingers.  Pt reports now areas have had some drainage.  Pt went to the pharmacist and was told to come here for possible infection   The history is provided by the patient. No language interpreter was used.  Fall This is a new problem. The problem occurs constantly. Nothing aggravates the symptoms. Nothing relieves the symptoms. She has tried nothing for the symptoms. The treatment provided no relief.  Ankle Pain   Past Medical History:  Diagnosis Date  . 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 Endometriosis   . Fibroids 10/95  . 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 Insomnia   . Menopausal state 2007  . STD (sexually transmitted disease) 8/09   chlamydia  . Vitamin D deficiency disease 2010    Patient Active Problem List   Diagnosis Date Noted  . Contusion of sacrum 02/20/2017  . Bilateral primary osteoarthritis of knee 05/18/2016  . Post-menopausal atrophic vaginitis 02/29/2016    Past Surgical History:  Procedure Laterality Date  . BREAST SURGERY Left 6/03   Biopsy negative  . CERVIX LESION DESTRUCTION  4/92  . COLONOSCOPY W/ BIOPSIES  09/26/10   polyp benign  . COLPOSCOPY  10/12   CIN I  . KNEE SURGERY  12/2009   left for meniscal tear  . LAPAROSCOPY  7/02   multiple surgeries, including LSO, LOA, and endometrioma, and fibroids secondary to endometriosis  . UMBILICAL EXPLORATION  4/46   for endometrioma     OB History    Gravida  0   Para  0   Term  0   Preterm  0   AB  0   Living  0     SAB  0   TAB  0   Ectopic  0   Multiple  0   Live Births  0            Home  Medications    Prior to Admission medications   Medication Sig Start Date End Date Taking? Authorizing Provider  cetirizine (ZYRTEC) 10 MG tablet Take 10 mg by mouth daily.    [provider]  Cholecalciferol (VITAMIN D PO) Take by mouth.    [provider]  doxycycline (VIBRAMYCIN) 100 MG capsule Take 1 capsule (100 mg total) by mouth 2 (two) times daily. 09/19/18   Fransico Meadow, PA-C  estradiol (ESTRACE VAGINAL) 0.1 MG/GM vaginal cream Use twice weekly 01/15/18   Regina Eck, CNM  FERROUS SULFATE PO Take by mouth.    [provider]  fish oil-omega-3 fatty acids 1000 MG capsule Take 2 g by mouth daily.    [provider]  fluconazole (DIFLUCAN) 150 MG tablet Take 1 tablet (150 mg total) by mouth daily. 09/19/18   Fransico Meadow, PA-C  fluticasone (FLONASE) 50 MCG/ACT nasal spray Place 2 sprays into the nose daily.    [provider]  ibuprofen (ADVIL,MOTRIN) 800 MG tablet TK 1 T PO Q 6 H FOR 10 DAYS PRN PAIN 12/02/16  [provider]  methocarbamol (ROBAXIN) 500 MG tablet as needed. 12/04/16   [provider]  Multiple Vitamins-Minerals (MULTIVITAMIN WITH MINERALS) tablet Take 1 tablet by mouth daily.    [provider]  nitrofurantoin, macrocrystal-monohydrate, (MACROBID) 100 MG capsule Take 1 capsule (100 mg total) by mouth 2 (two) times daily. 02/14/18   Regina Eck, CNM  phenazopyridine (PYRIDIUM) 100 MG tablet Take 1 tablet (100 mg total) by mouth 3 (three) times daily as needed for pain. 02/14/18   Regina Eck, CNM    Family History Family History  Problem Relation Age of Onset  . Hypertension Mother   . Diabetes Father   . Hypertension Father   . Diabetes Brother   . Diabetes Paternal Grandfather     Social History Social History   Tobacco Use  . Smoking status: Never Smoker  . Smokeless tobacco: Never Used  Substance Use Topics  . Alcohol use: Yes    Comment: once or twice a monthly   . Drug use: No     Allergies   Sulfa antibiotics and Codeine   Review of Systems Review of Systems  Musculoskeletal: Positive for joint swelling.  Skin: Positive for wound.  All other systems reviewed and are negative.    Physical Exam Triage Vital Signs ED Triage Vitals  Enc Vitals Group     BP 09/19/18 1719 (!) 112/93     Pulse Rate 09/19/18 1719 86     Resp 09/19/18 1719 18     Temp 09/19/18 1719 98.2 F (36.8 C)     Temp Source 09/19/18 1719 Oral     SpO2 09/19/18 1719 98 %     Weight 09/19/18 1720 215 lb (97.5 kg)     Height --      Head Circumference --      Peak Flow --      Pain Score 09/19/18 1720 5     Pain Loc --      Pain Edu? --      Excl. in [place]? --    No data found.  Updated Vital Signs BP (!) 112/93 (BP Location: Right Arm)   Pulse 86   Temp 98.2 F (36.8 C) (Oral)   Resp 18   Wt 97.5 kg   LMP 10/14/2005 (Approximate)   SpO2 98%   BMI 32.69 kg/m   Visual Acuity Right Eye Distance:   Left Eye Distance:   Bilateral Distance:    Right Eye Near:   Left Eye Near:    Bilateral Near:     Physical Exam Vitals signs reviewed.  Musculoskeletal:        General: Swelling, tenderness and signs of injury present.     Comments: Swelling over 3rd and 4th fingers, nv and ns intact  Skin:    General: Skin is warm.  Neurological:     General: No focal deficit present.     Mental Status: She is alert.  Psychiatric:        Mood and Affect: Mood normal.      UC Treatments / Results  Labs (all labs ordered are listed, but only abnormal results are displayed) Labs Reviewed - No data to display  EKG   Radiology No results found.  Procedures Procedures (including critical care time)  Medications Ordered in UC Medications - No data to display  Initial Impression / Assessment and Plan / UC Course  I have reviewed the triage vital signs and the nursing notes.  Pertinent labs &  imaging results that were available during my care of the  patient were reviewed by me and considered in my medical decision making (see chart for details).     MDM  I will start pt on doxy.  Pt advised pt to continue topical antibiotics Final Clinical Impressions(s) / UC Diagnoses   Final diagnoses:  Laceration of left middle finger, foreign body presence unspecified, nail damage status unspecified, initial encounter     Discharge Instructions     Return if any problems.    ED Prescriptions    Medication Sig Dispense Auth. Provider   doxycycline (VIBRAMYCIN) 100 MG capsule Take 1 capsule (100 mg total) by mouth 2 (two) times daily. 20 capsule Sofia, Leslie K, [place]   fluconazole (DIFLUCAN) 150 MG tablet Take 1 tablet (150 mg total) by mouth daily. 1 tablet Fransico Meadow, [place]     Controlled Substance Prescriptions [place] Controlled Substance Registry consulted? Not Applicable   Fransico Meadow, [place] 09/19/18 4715

## 2018-09-19 NOTE — ED Triage Notes (Signed)
Pt states she fell Sunday. Pt hurt her left hand ring finger and middle finger.

## 2018-11-20 DIAGNOSIS — Z1159 Encounter for screening for other viral diseases: Secondary | ICD-10-CM | POA: Diagnosis not present

## 2019-01-08 DIAGNOSIS — Z20828 Contact with and (suspected) exposure to other viral communicable diseases: Secondary | ICD-10-CM | POA: Diagnosis not present

## 2019-01-17 ENCOUNTER — Other Ambulatory Visit: Payer: Self-pay

## 2019-01-20 NOTE — Progress Notes (Signed)
58 y.o. G0P0000 Single African American Fe here for annual exam. Menopausal no HRT. Denies vaginal bleeding. Estrace cream working well for dryness, needs update. Patient aware of weight gain, did not weigh at last aex. She is exercising and eating better now. Had shingles on right side earlier in year. Plans shingle vaccine. Working at home now and adjusting. No partner change. Social stress with death of brother with bowel issues.  Requests pap smear today due to history of abnormal pap. Labs at work, all normal. Sees Dr. Redmond [employer] as needed. No other health issues today.  Patient's last menstrual period was 10/14/2005 (approximate).          Sexually active: Yes.    The current method of family planning is post menopausal status.    Exercising: Yes.    4 days week Smoker:  no  Review of Systems  Constitutional: Negative.   HENT: Negative.   Eyes: Negative.   Respiratory: Negative.   Cardiovascular: Negative.   Gastrointestinal: Negative.   Genitourinary: Negative.   Musculoskeletal: Negative.   Skin: Negative.   Neurological: Negative.   Endo/Heme/Allergies: Negative.   Psychiatric/Behavioral: Negative.     Health Maintenance: Pap:  12-19-16 neg HPV HR neg History of Abnormal Pap: yes, cryo? MMG:  04-01-2018 category b density birads 1:neg Self Breast exams: yes Colonoscopy:  2017 f/u 69yrs per patient,polyps BMD:   none TDaP:  2014 Shingles: no Pneumonia: no Hep C and HIV: HIV & hep c neg 2018 Labs: yes   reports that she has never smoked. She has never used smokeless tobacco. She reports current alcohol use. She reports that she does not use drugs.  Past Medical History:  Diagnosis Date  . 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 Endometriosis   . Fibroids 10/95  . 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 Insomnia   . Menopausal state 2007  . STD (sexually transmitted disease) 8/09   chlamydia  . Vitamin D deficiency disease 2010    Past Surgical History:   Procedure Laterality Date  . BREAST SURGERY Left 6/03   Biopsy negative  . CERVIX LESION DESTRUCTION  4/92  . COLONOSCOPY W/ BIOPSIES  09/26/10   polyp benign  . COLPOSCOPY  10/12   CIN I  . KNEE SURGERY  12/2009   left for meniscal tear  . LAPAROSCOPY  7/02   multiple surgeries, including LSO, LOA, and endometrioma, and fibroids secondary to endometriosis  . UMBILICAL EXPLORATION  0000000   for endometrioma     Current Outpatient Medications  Medication Sig Dispense Refill  . cetirizine (ZYRTEC) 10 MG tablet Take 10 mg by mouth daily.    . Cholecalciferol (VITAMIN D PO) Take by mouth.    . doxycycline (VIBRAMYCIN) 100 MG capsule Take 1 capsule (100 mg total) by mouth 2 (two) times daily. 20 capsule 0  . estradiol (ESTRACE VAGINAL) 0.1 MG/GM vaginal cream Use twice weekly 42.5 g 3  . FERROUS SULFATE PO Take by mouth.    . fish oil-omega-3 fatty acids 1000 MG capsule Take 2 g by mouth daily.    . fluconazole (DIFLUCAN) 150 MG tablet Take 1 tablet (150 mg total) by mouth daily. 1 tablet 1  . fluticasone (FLONASE) 50 MCG/ACT nasal spray Place 2 sprays into the nose daily.    Marland Kitchen ibuprofen (ADVIL,MOTRIN) 800 MG tablet TK 1 T PO Q 6 H FOR 10 DAYS PRN PAIN  0  . methocarbamol (ROBAXIN) 500 MG tablet as  needed.  0  . Multiple Vitamins-Minerals (MULTIVITAMIN WITH MINERALS) tablet Take 1 tablet by mouth daily.    . nitrofurantoin, macrocrystal-monohydrate, (MACROBID) 100 MG capsule Take 1 capsule (100 mg total) by mouth 2 (two) times daily. 14 capsule 0  . phenazopyridine (PYRIDIUM) 100 MG tablet Take 1 tablet (100 mg total) by mouth 3 (three) times daily as needed for pain. 12 tablet 0   No current facility-administered medications for this visit.     Family History  Problem Relation Age of Onset  . Hypertension Mother   . Diabetes Father   . Hypertension Father   . Diabetes Brother   . Diabetes Paternal Grandfather     ROS:  Pertinent items are noted in HPI.  Otherwise, a  comprehensive ROS was negative.  Exam:   LMP 10/14/2005 (Approximate)    Ht Readings from Last 3 Encounters:  08/17/17 5\' 8"  (1.727 m)  07/11/17 5\' 8"  (1.727 m)  05/14/17 5\' 8"  (1.727 m)    General appearance: alert, cooperative and appears stated age Head: Normocephalic, without obvious abnormality, atraumatic Neck: no adenopathy, supple, symmetrical, trachea midline and thyroid normal to inspection and palpation Lungs: clear to auscultation bilaterally Breasts: normal appearance, no masses or tenderness, No nipple retraction or dimpling, No nipple discharge or bleeding, No axillary or supraclavicular adenopathy Heart: regular rate and rhythm Abdomen: soft, non-tender; no masses,  no organomegaly Extremities: extremities normal, atraumatic, no cyanosis or edema Skin: Skin color, texture, turgor normal. No rashes or lesions Lymph nodes: Cervical, supraclavicular, and axillary nodes normal. No abnormal inguinal nodes palpated Neurologic: Grossly normal   Pelvic: External genitalia:  no lesions              Urethra:  normal appearing urethra with no masses, tenderness or lesions              Bartholin's and Skene's: normal                 Vagina: normal appearing vagina with normal color and discharge, no lesions              Cervix: no cervical motion tenderness, no lesions and LEEP appearance              Pap taken: Yes.   Bimanual Exam:  Uterus:  normal size, contour, position, consistency, mobility, non-tender and mid position              Adnexa: normal adnexa and no mass, fullness, tenderness               Rectovaginal: Confirms               Anus:  normal sphincter tone, no lesions  Chaperone present: yes  A:  Well Woman with normal exam  Menopausal no HRT  Vaginal dryness Estrace cream working well  STD screening  History of LEEP, [facility] requests pap smear today  Social stress with twin brother's death.  History of shingles this year  P:   Reviewed health and wellness  pertinent to exam  Aware of need to advise if vaginal bleeding  Discussed risks/benefits/warning signs with use of Estrace cream and not to overuse.  Rx Estrace see order with instructions  Lab: Affirm, GC,Chlamydia, HIV,RPR, Hep C  Offered my condolences and encouraged patient to seek family and friend support  Pap smear: yes   counseled on breast self exam, mammography screening, STD prevention, HIV risk factors and prevention, feminine hygiene, adequate intake of calcium and vitamin  D, diet and exercise  return annually or prn  An After Visit Summary was printed and given to the patient.

## 2019-01-21 ENCOUNTER — Other Ambulatory Visit: Payer: Self-pay

## 2019-01-21 ENCOUNTER — Encounter: Payer: Self-pay | Admitting: Certified Nurse Midwife

## 2019-01-21 ENCOUNTER — Ambulatory Visit (INDEPENDENT_AMBULATORY_CARE_PROVIDER_SITE_OTHER): Payer: BC Managed Care – PPO | Admitting: Certified Nurse Midwife

## 2019-01-21 ENCOUNTER — Other Ambulatory Visit (HOSPITAL_COMMUNITY)
Admission: RE | Admit: 2019-01-21 | Discharge: 2019-01-21 | Disposition: A | Discharge status: Home / Self Care | Payer: BC Managed Care – PPO | Source: Ambulatory Visit | Attending: Certified Nurse Midwife | Admitting: Certified Nurse Midwife

## 2019-01-21 VITALS — BP 118/80 | HR 70 | Temp 97.1°F | Resp 16 | Ht 68.75 in | Wt 224.0 lb

## 2019-01-21 DIAGNOSIS — Z124 Encounter for screening for malignant neoplasm of cervix: Secondary | ICD-10-CM | POA: Diagnosis not present

## 2019-01-21 DIAGNOSIS — Z01419 Encounter for gynecological examination (general) (routine) without abnormal findings: Secondary | ICD-10-CM

## 2019-01-21 DIAGNOSIS — E559 Vitamin D deficiency, unspecified: Secondary | ICD-10-CM

## 2019-01-21 DIAGNOSIS — Z113 Encounter for screening for infections with a predominantly sexual mode of transmission: Secondary | ICD-10-CM | POA: Insufficient documentation

## 2019-01-21 DIAGNOSIS — N951 Menopausal and female climacteric states: Secondary | ICD-10-CM

## 2019-01-21 DIAGNOSIS — Z8742 Personal history of other diseases of the female genital tract: Secondary | ICD-10-CM

## 2019-01-21 DIAGNOSIS — N952 Postmenopausal atrophic vaginitis: Secondary | ICD-10-CM

## 2019-01-21 MED ORDER — ESTRADIOL 0.1 MG/GM VA CREA: TOPICAL_CREAM | VAGINAL | 3 refills | Status: DC

## 2019-01-21 NOTE — Patient Instructions (Signed)
EXERCISE AND DIET:  We recommended that you start or continue a regular exercise program for good health. Regular exercise means any activity that makes your heart beat faster and makes you sweat.  We recommend exercising at least 30 minutes per day at least 3 days a week, preferably 4 or 5.  We also recommend a diet low in fat and sugar.  Inactivity, poor dietary choices and obesity can cause diabetes, heart attack, stroke, and kidney damage, among others.   ° °ALCOHOL AND SMOKING:  Women should limit their alcohol intake to no more than 7 drinks/beers/glasses of wine (combined, not each!) per week. Moderation of alcohol intake to this level decreases your risk of breast cancer and liver damage. And of course, no recreational drugs are part of a healthy lifestyle.  And absolutely no smoking or even second hand smoke. Most people know smoking can cause heart and lung diseases, but did you know it also contributes to weakening of your bones? Aging of your skin?  Yellowing of your teeth and nails? ° °CALCIUM AND VITAMIN D:  Adequate intake of calcium and Vitamin D are recommended.  The recommendations for exact amounts of these supplements seem to change often, but generally speaking 600 mg of calcium (either carbonate or citrate) and 800 units of Vitamin D per day seems prudent. Certain women may benefit from higher intake of Vitamin D.  If you are among these women, your doctor will have told you during your visit.   ° °PAP SMEARS:  Pap smears, to check for cervical cancer or precancers,  have traditionally been done yearly, although recent scientific advances have shown that most women can have pap smears less often.  However, every woman still should have a physical exam from her gynecologist every year. It will include a breast check, inspection of the vulva and vagina to check for abnormal growths or skin changes, a visual exam of the cervix, and then an exam to evaluate the size and shape of the uterus and  ovaries.  And after 58 years of age, a rectal exam is indicated to check for rectal cancers. We will also provide age appropriate advice regarding health maintenance, like when you should have certain vaccines, screening for sexually transmitted diseases, bone density testing, colonoscopy, mammograms, etc.  ° °MAMMOGRAMS:  All women over 40 years old should have a yearly mammogram. Many facilities now offer a "3D" mammogram, which may cost around $50 extra out of pocket. If possible,  we recommend you accept the option to have the 3D mammogram performed.  It both reduces the number of women who will be called back for extra views which then turn out to be normal, and it is better than the routine mammogram at detecting truly abnormal areas.   ° °COLONOSCOPY:  Colonoscopy to screen for colon cancer is recommended for all women at age 50.  We know, you hate the idea of the prep.  We agree, BUT, having colon cancer and not knowing it is worse!!  Colon cancer so often starts as a polyp that can be seen and removed at colonscopy, which can quite literally save your life!  And if your first colonoscopy is normal and you have no family history of colon cancer, most women don't have to have it again for 10 years.  Once every ten years, you can do something that may end up saving your life, right?  We will be happy to help you get it scheduled when you are ready.    Be sure to check your insurance coverage so you understand how much it will cost.  It may be covered as a preventative service at no cost, but you should check your particular policy.   ° ° ° °Atrophic Vaginitis °Atrophic vaginitis is a condition in which the tissues that line the vagina become dry and thin. This condition occurs in women who have stopped having their period. It is caused by a drop in a female hormone (estrogen). This hormone helps: °· To keep the vagina moist. °· To make a clear fluid. This clear fluid helps: °? To make the vagina ready for  sex. °? To protect the vagina from infection. °If the lining of the vagina is dry and thin, it may cause irritation, burning, or itchiness. It may also: °· Make sex painful. °· Make an exam of your vagina painful. °· Cause bleeding. °· Make you lose interest in sex. °· Cause a burning feeling when you pee (urinate). °· Cause a brown or yellow fluid to come from your vagina. °Some women do not have symptoms. °Follow these instructions at home: °Medicines °· Take over-the-counter and prescription medicines only as told by your doctor. °· Do not use herbs or other medicines unless your doctor says it is okay. °· Use medicines for for dryness. These include: °? Oils to make the vagina soft. °? Creams. °? Moisturizers. °General instructions °· Do not douche. °· Do not use products that can make your vagina dry. These include: °? Scented sprays. °? Scented tampons. °? Scented soaps. °· Sex can help increase blood flow and soften the tissue in the vagina. If it hurts to have sex: °? Tell your partner. °? Use products to make sex more comfortable. Use these only as told by your doctor. °Contact a doctor if you: °· Have discharge from the vagina that is different than usual. °· Have a bad smell coming from your vagina. °· Have new symptoms. °· Do not get better. °· Get worse. °Summary °· Atrophic vaginitis is a condition in which the lining of the vagina becomes dry and thin. °· This condition affects women who have stopped having their periods. °· Treatment may include using products that help make the vagina soft. °· Call a doctor if do not get better with treatment. °This information is not intended to replace advice given to you by your health care provider. Make sure you discuss any questions you have with your health care provider. °Document Released: 07/19/2007 Document Revised: 02/12/2017 Document Reviewed: 02/12/2017 °Elsevier Patient Education © 2020 [employer] Inc[employer] ° °

## 2019-01-22 LAB — VAGINITIS/VAGINOSIS, DNA PROBE
Candida Species: NEGATIVE
Gardnerella vaginalis: NEGATIVE
Trichomonas vaginosis: NEGATIVE

## 2019-01-22 LAB — CYTOLOGY - PAP
Adequacy: ABSENT
Chlamydia: NEGATIVE
Comment: NEGATIVE
Comment: NORMAL
Diagnosis: NEGATIVE
Neisseria Gonorrhea: NEGATIVE

## 2019-01-22 LAB — HIV ANTIBODY (ROUTINE TESTING W REFLEX): HIV Screen 4th Generation wRfx: NONREACTIVE

## 2019-01-22 LAB — HEPATITIS C ANTIBODY: Hep C Virus Ab: <0.1 s/co ratio (ref 0.0–0.9)

## 2019-01-22 LAB — RPR: RPR Ser Ql: NONREACTIVE

## 2019-01-22 LAB — VITAMIN D 25 HYDROXY (VIT D DEFICIENCY, FRACTURES): Vit D, 25-Hydroxy: 36.9 ng/mL (ref 30.0–100.0)

## 2019-03-17 ENCOUNTER — Encounter: Payer: Self-pay | Admitting: Orthopedic Surgery

## 2019-03-17 ENCOUNTER — Ambulatory Visit: Payer: BC Managed Care – PPO | Admitting: Orthopedic Surgery

## 2019-03-17 ENCOUNTER — Other Ambulatory Visit: Payer: Self-pay

## 2019-03-17 VITALS — Ht 68.0 in | Wt 224.0 lb

## 2019-03-17 DIAGNOSIS — M170 Bilateral primary osteoarthritis of knee: Secondary | ICD-10-CM

## 2019-03-17 NOTE — Progress Notes (Signed)
Office Visit Note   Patient: Crystal Simmons           Date of Birth: 1960-11-13           MRN: [id number] Visit Date: 03/17/2019              Requested by: Denita Lung, MD [address] [place],  [place] [address] PCP: Denita Lung, MD  Chief Complaint  Patient presents with  . Right Knee - Follow-up    Last cortisone injection was 05/2017  . Left Knee - Follow-up      HPI: Patient is a 59 year old woman who presents in follow-up for osteoarthritis of both knees left worse than right. Patient had an injection about a year ago which helped. Patient states she has resumed exercising and has increased pain in her knees. Patient denies any mechanical catching or locking.  Assessment & Plan: Visit Diagnoses:  1. Bilateral primary osteoarthritis of knee     Plan: Both knees were injected she tolerated this well she will continue with her strengthening exercises.  Follow-Up Instructions: Return if symptoms worsen or fail to improve.   Ortho Exam  Patient is alert, oriented, no adenopathy, well-dressed, normal affect, normal respiratory effort. Examination patient has an antalgic gait she has a mild effusion worse on the left than the right knee. Collaterals and cruciates are stable bilaterally she has crepitation range of motion of both knees. There is no redness no cellulitis.  Imaging: No results found. No images are attached to the encounter.  Labs: Lab Results  Component Value Date   HGBA1C 5.4 12/13/2015   LABORGA NO GROWTH 02/29/2016     Lab Results  Component Value Date   ALBUMIN 4.1 12/19/2016   ALBUMIN 3.7 12/13/2015   ALBUMIN 3.9 12/02/2013    No results found for: MG Lab Results  Component Value Date   VD25OH 36.9 01/21/2019   VD25OH 24.5 (L) 01/15/2018   VD25OH 43.5 12/19/2016    No results found for: PREALBUMIN CBC EXTENDED Latest Ref Rng & Units 01/15/2018 12/19/2016 12/13/2015  WBC 3.4 - 10.8 x10E3/uL 9.1 9.0 7.4  RBC 3.77 - 5.28  x10E6/uL 3.97 3.89 3.84  HGB 11.1 - 15.9 g/dL 12.2 11.7 11.3(L)  HCT 34.0 - 46.6 % 37.3 36.1 36.4  PLT 150 - 450 x10E3/uL 316 354 339     Body mass index is 34.06 kg/m.  Orders:  No orders of the defined types were placed in this encounter.  No orders of the defined types were placed in this encounter.    Procedures: Large Joint Inj: bilateral knee on 03/17/2019 8:40 AM Indications: pain and diagnostic evaluation Details: 22 G 1.5 in needle, anteromedial approach  Arthrogram: No  Outcome: tolerated well, no immediate complications Procedure, treatment alternatives, risks and benefits explained, specific risks discussed. Consent was given by the patient. Immediately prior to procedure a time out was called to verify the correct patient, procedure, equipment, support staff and site/side marked as required. Patient was prepped and draped in the usual sterile fashion.      Clinical Data: No additional findings.  ROS:  All other systems negative, except as noted in the HPI. Review of Systems  Objective: Vital Signs: Ht 5\' 8"  (1.727 m)   Wt 224 lb (101.6 kg)   LMP 10/14/2005 (Approximate)   BMI 34.06 kg/m   Specialty Comments:  No specialty comments available.  PMFS History: Patient Active Problem List   Diagnosis Date Noted  . Contusion of sacrum  02/20/2017  . Bilateral primary osteoarthritis of knee 05/18/2016  . Post-menopausal atrophic vaginitis 02/29/2016   Past Medical History:  Diagnosis Date  . 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 Endometriosis   . Fibroids 10/95  . 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 Insomnia   . Menopausal state 2007  . Shingles 04/2018  . STD (sexually transmitted disease) 8/09   chlamydia  . Vitamin D deficiency disease 2010    Family History  Problem Relation Age of Onset  . Hypertension Mother   . Diabetes Father   . Hypertension Father   . Diabetes Brother   . Diabetes Paternal Grandfather     Past  Surgical History:  Procedure Laterality Date  . BREAST SURGERY Left 6/03   Biopsy negative  . CERVIX LESION DESTRUCTION  4/92  . COLONOSCOPY W/ BIOPSIES  09/26/10   polyp benign  . COLPOSCOPY  10/12   CIN I  . KNEE SURGERY  12/2009   left for meniscal tear  . LAPAROSCOPY  7/02   multiple surgeries, including LSO, LOA, and endometrioma, and fibroids secondary to endometriosis  . UMBILICAL EXPLORATION  0000000   for endometrioma    Social History   Occupational History  . 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 Smoking status: Never Smoker  . Smokeless tobacco: Never Used  Substance and Sexual Activity  . Alcohol use: Yes    Comment: 2-3 times a month  . Drug use: No  . Sexual activity: Yes    Partners: Male    Birth control/protection: Post-menopausal

## 2019-04-01 ENCOUNTER — Other Ambulatory Visit: Payer: Self-pay | Admitting: Certified Nurse Midwife

## 2019-04-01 DIAGNOSIS — Z1231 Encounter for screening mammogram for malignant neoplasm of breast: Secondary | ICD-10-CM

## 2019-05-01 ENCOUNTER — Ambulatory Visit (INDEPENDENT_AMBULATORY_CARE_PROVIDER_SITE_OTHER): Payer: BC Managed Care – PPO | Admitting: Family Medicine

## 2019-05-01 ENCOUNTER — Encounter (INDEPENDENT_AMBULATORY_CARE_PROVIDER_SITE_OTHER): Payer: Self-pay | Admitting: Family Medicine

## 2019-05-01 ENCOUNTER — Other Ambulatory Visit: Payer: Self-pay

## 2019-05-01 VITALS — BP 118/74 | HR 67 | Temp 98.3°F | Ht 68.0 in | Wt 211.0 lb

## 2019-05-01 DIAGNOSIS — R0602 Shortness of breath: Secondary | ICD-10-CM | POA: Diagnosis not present

## 2019-05-01 DIAGNOSIS — M170 Bilateral primary osteoarthritis of knee: Secondary | ICD-10-CM

## 2019-05-01 DIAGNOSIS — E559 Vitamin D deficiency, unspecified: Secondary | ICD-10-CM

## 2019-05-01 DIAGNOSIS — K219 Gastro-esophageal reflux disease without esophagitis: Secondary | ICD-10-CM | POA: Diagnosis not present

## 2019-05-01 DIAGNOSIS — F3289 Other specified depressive episodes: Secondary | ICD-10-CM

## 2019-05-01 DIAGNOSIS — E669 Obesity, unspecified: Secondary | ICD-10-CM

## 2019-05-01 DIAGNOSIS — Z0289 Encounter for other administrative examinations: Secondary | ICD-10-CM

## 2019-05-01 DIAGNOSIS — R5383 Other fatigue: Secondary | ICD-10-CM

## 2019-05-01 DIAGNOSIS — Z6832 Body mass index (BMI) 32.0-32.9, adult: Secondary | ICD-10-CM

## 2019-05-01 NOTE — Addendum Note (Signed)
Addended by: Elmer Bales on: 05/01/2019 03:55 PM   Modules accepted: Orders

## 2019-05-01 NOTE — Progress Notes (Addendum)
Chief Complaint:   OBESITY Crystal Simmons (MR# [id number]) is a 59 y.o. female who presents for evaluation and treatment of obesity and related comorbidities. Current BMI is Body mass index is 32.08 kg/m. Crystal Simmons has been struggling with her weight for many years and has been unsuccessful in either losing weight, maintaining weight loss, or reaching her healthy weight goal.  Crystal Simmons is currently in the action stage of change and ready to dedicate time achieving and maintaining a healthier weight. Crystal Simmons is interested in becoming our patient and working on intensive lifestyle modifications including (but not limited to) diet and exercise for weight loss.  Crystal Simmons has provided the following food recall: Breakfast:  Oatmeal, fruit. Lunch:  Salmon or chicken/salad Dinner:  Vegetables (4-5 ounces) - sweet potato Snack:  Walnuts/almonds Drink:  Water 64-82 ounces daily.  She is exercising and getting 10,000 steps a day.  Crystal Simmons's habits were reviewed today and are as follows: her desired weight loss is 39 pounds, she has been heavy most of her life, she started gaining weight during COVID, her heaviest weight ever was 225 pounds, she is frequently drinking liquids with calories, she frequently makes poor food choices and she struggles with emotional eating.  Depression Screen Crystal Simmons's Food and Mood (modified PHQ-9) score was 9.  Depression screen PHQ 2/9 05/01/2019  Decreased Interest 1  Down, Depressed, Hopeless 1  PHQ - 2 Score 2  Altered sleeping 1  Tired, decreased energy 1  Change in appetite 1  Feeling bad or failure about yourself  2  Trouble concentrating 2  Moving slowly or fidgety/restless 0  Suicidal thoughts 0  PHQ-9 Score 9  Difficult doing work/chores Not difficult at all   Subjective:   1. Other fatigue Crystal Simmons admits to daytime somnolence and denies waking up still tired. Patent has a history of symptoms of morning fatigue and snoring. Crystal Simmons generally gets 6 or 7  hours of sleep per night, and states that she has poor quality sleep. Snoring is present. Apneic episodes are not present. Epworth Sleepiness Score is 4.  2. SOB (shortness of breath) on exertion Crystal Simmons notes increasing shortness of breath with exercising and seems to be worsening over time with weight gain. She notes getting out of breath sooner with activity than she used to. This has gotten worse recently. Crystal Simmons denies shortness of breath at rest or orthopnea.  3. Vitamin D deficiency Crystal Simmons's Vitamin D level was 36.9 on 01/21/2019. She is currently taking vit D. She denies nausea, vomiting or muscle weakness.  4. Gastroesophageal reflux disease without esophagitis She has been experiencing acid reflux.   5. Bilateral primary osteoarthritis of knee Crystal Simmons suffers from pain in both knees.  6. Other depression, with emotional eating Crystal Simmons is struggling with emotional eating and using food for comfort to the extent that it is negatively impacting her health. She has been working on behavior modification techniques to help reduce her emotional eating and has been unsuccessful. She shows no sign of suicidal or homicidal ideations.  PHQ-9 is 9 today.  Assessment/Plan:   1. Other fatigue Crystal Simmons does feel that her weight is causing her energy to be lower than it should be. Fatigue may be related to obesity, depression or many other causes. Labs will be ordered, and in the meanwhile, Crystal Simmons will focus on self care including making healthy food choices, increasing physical activity and focusing on stress reduction.  Orders - EKG 12-Lead  2. SOB (shortness of breath) on exertion Crystal Simmons does  feel that she gets out of breath more easily that she used to when she exercises. Crystal Simmons's shortness of breath appears to be obesity related and exercise induced. She has agreed to work on weight loss and gradually increase exercise to treat her exercise induced shortness of breath. Will continue to monitor  closely.  3. Vitamin D deficiency Low Vitamin D level contributes to fatigue and are associated with obesity, breast, and colon cancer. She agrees to continue to take OTC vitamin D supplements.  4. Gastroesophageal reflux disease without esophagitis Intensive lifestyle modifications are the first line treatment for this issue. We discussed several lifestyle modifications today and she will continue to work on diet, exercise and weight loss efforts. Orders and follow up as documented in patient record.   Counseling . If a person has gastroesophageal reflux disease (GERD), food and stomach acid move back up into the esophagus and cause symptoms or problems such as damage to the esophagus. . Anti-reflux measures include: raising the head of the bed, avoiding tight clothing or belts, avoiding eating late at night, not lying down shortly after mealtime, and achieving weight loss. . Avoid ASA, NSAID's, caffeine, alcohol, and tobacco.  . OTC Pepcid and/or Tums are often very helpful for as needed use.  Marland Kitchen However, for persisting chronic or daily symptoms, stronger medications like Omeprazole may be needed. . You may need to avoid foods and drinks such as: ? Coffee and tea (with or without caffeine). ? Drinks that contain alcohol. ? Energy drinks and sports drinks. ? Bubbly (carbonated) drinks or sodas. ? Chocolate and cocoa. ? Peppermint and mint flavorings. ? Garlic and onions. ? Horseradish. ? Spicy and acidic foods. These include peppers, chili powder, curry powder, vinegar, hot sauces, and BBQ sauce. ? Citrus fruit juices and citrus fruits, such as oranges, lemons, and limes. ? Tomato-based foods. These include red sauce, chili, salsa, and pizza with red sauce. ? Fried and fatty foods. These include donuts, french fries, potato chips, and high-fat dressings. ? High-fat meats. These include hot dogs, rib eye steak, sausage, ham, and bacon.  5. Bilateral primary osteoarthritis of knee Will  follow because mobility and pain control are important for weight management.  6. Other depression, with emotional eating Behavior modification techniques were discussed today to help Crystal Simmons deal with her emotional/non-hunger eating behaviors.  Orders and follow up as documented in patient record.   7. Class 1 obesity with serious comorbidity and body mass index (BMI) of 32.0 to 32.9 in adult, unspecified obesity type Crystal Simmons is currently in the action stage of change and her goal is to continue with weight loss efforts. I recommend Crystal Simmons begin the structured treatment plan as follows:  She has agreed to keeping a food journal and adhering to recommended goals of 1000-1200 calories and 85+ grams of protein.  Exercise goals: As is.  MyFitnessPal. Average Calories 1000-1100.   Behavioral modification strategies: increasing lean protein intake, decreasing simple carbohydrates, increasing water intake and increasing high fiber foods.  She was informed of the importance of frequent follow-up visits to maximize her success with intensive lifestyle modifications for her multiple health conditions. She was informed we would discuss her lab results at her next visit unless there is a critical issue that needs to be addressed sooner. Crystal Simmons agreed to keep her next visit at the agreed upon time to discuss these results.  Objective:   Blood pressure 118/74, pulse 67, temperature 98.3 F (36.8 C), temperature source Oral, height 5\' 8"  (1.727 m),  weight 211 lb (95.7 kg), last menstrual period 10/14/2005, SpO2 98 %. Body mass index is 32.08 kg/m.  EKG: Normal sinus rhythm, rate 82 bpm.  Indirect Calorimeter completed today shows a VO2 of 230 and a REE of 1604.  Her calculated basal metabolic rate is 123456 thus her basal metabolic rate is worse than expected.  General: Cooperative, alert, well developed, in no acute distress. HEENT: Conjunctivae and lids unremarkable. Cardiovascular: Regular rhythm.    Lungs: Normal work of breathing. Neurologic: No focal deficits.   Lab Results  Component Value Date   CREATININE 0.74 12/19/2016   BUN 8 12/19/2016   NA 143 12/19/2016   K 4.1 12/19/2016   CL 103 12/19/2016   CO2 23 12/19/2016   Lab Results  Component Value Date   ALT 15 12/19/2016   AST 18 12/19/2016   ALKPHOS 108 12/19/2016   BILITOT 0.7 12/19/2016   Lab Results  Component Value Date   HGBA1C 5.4 12/13/2015   Lab Results  Component Value Date   TSH 1.690 12/19/2016   Lab Results  Component Value Date   CHOL 128 12/19/2016   HDL 51 12/19/2016   LDLCALC 61 12/19/2016   TRIG 80 12/19/2016   CHOLHDL 2.5 12/19/2016   Lab Results  Component Value Date   WBC 9.1 01/15/2018   HGB 12.2 01/15/2018   HCT 37.3 01/15/2018   MCV 94 01/15/2018   PLT 316 01/15/2018   Attestation Statements:   This is the patient's first visit at [facility]. The patient's NEW PATIENT PACKET was reviewed at length. Included in the packet: current and past health history, medications, allergies, ROS, gynecologic history (women only), surgical history, family history, social history, weight history, weight loss surgery history (for those that have had weight loss surgery), nutritional evaluation, mood and food questionnaire, PHQ9, Epworth questionnaire, sleep habits questionnaire, patient life and health improvement goals questionnaire. These will all be scanned into the patient's chart under media.   During the visit, I independently reviewed the patient's EKG, bioimpedance scale results, and indirect calorimeter results. I used this information to tailor a meal plan for the patient that will help her to lose weight and will improve her obesity-related conditions going forward. I performed a medically necessary appropriate examination and/or evaluation. I discussed the assessment and treatment plan with the patient. The patient was provided an opportunity to ask questions and all were  answered. The patient agreed with the plan and demonstrated an understanding of the instructions. Labs were ordered at this visit and will be reviewed at the next visit unless more critical results need to be addressed immediately. Clinical information was updated and documented in the EMR.   I, Water quality scientist, CMA, am acting as Location manager for [employer], DO.  I have reviewed the above documentation for accuracy and completeness, and I agree with the above. Briscoe Deutscher, DO

## 2019-05-02 ENCOUNTER — Encounter (INDEPENDENT_AMBULATORY_CARE_PROVIDER_SITE_OTHER): Payer: Self-pay | Admitting: Family Medicine

## 2019-05-02 ENCOUNTER — Ambulatory Visit
Admission: RE | Admit: 2019-05-02 | Discharge: 2019-05-02 | Disposition: A | Discharge status: Home / Self Care | Payer: BC Managed Care – PPO | Source: Ambulatory Visit | Attending: Certified Nurse Midwife | Admitting: Certified Nurse Midwife

## 2019-05-02 DIAGNOSIS — Z1231 Encounter for screening mammogram for malignant neoplasm of breast: Secondary | ICD-10-CM | POA: Diagnosis not present

## 2019-05-02 LAB — INSULIN, RANDOM: INSULIN: 8.9 u[IU]/mL (ref 2.6–24.9)

## 2019-05-02 LAB — ANEMIA PANEL
Ferritin: 72 ng/mL (ref 15–150)
Folate, Hemolysate: 474 ng/mL
Folate, RBC: 1185 ng/mL (ref >498)
Hematocrit: 40 % (ref 34.0–46.6)
Iron Saturation: 28 % (ref 15–55)
Iron: 82 ug/dL (ref 27–159)
Retic Ct Pct: 1.8 % (ref 0.6–2.6)
Total Iron Binding Capacity: 296 ug/dL (ref 250–450)
UIBC: 214 ug/dL (ref 131–425)
Vitamin B-12: 1142 pg/mL (ref 232–1245)

## 2019-05-02 LAB — LIPID PANEL
Chol/HDL Ratio: 2.5 ratio (ref 0.0–4.4)
Cholesterol, Total: 149 mg/dL (ref 100–199)
HDL: 60 mg/dL (ref >39)
LDL Chol Calc (NIH): 76 mg/dL (ref 0–99)
Triglycerides: 63 mg/dL (ref 0–149)
VLDL Cholesterol Cal: 13 mg/dL (ref 5–40)

## 2019-05-02 LAB — COMPREHENSIVE METABOLIC PANEL
ALT: 14 IU/L (ref 0–32)
AST: 18 IU/L (ref 0–40)
Albumin/Globulin Ratio: 1.5 (ref 1.2–2.2)
Albumin: 4.5 g/dL (ref 3.8–4.9)
Alkaline Phosphatase: 112 IU/L (ref 39–117)
BUN/Creatinine Ratio: 13 (ref 9–23)
BUN: 11 mg/dL (ref 6–24)
Bilirubin Total: 0.5 mg/dL (ref 0.0–1.2)
CO2: 24 mmol/L (ref 20–29)
Calcium: 9.5 mg/dL (ref 8.7–10.2)
Chloride: 105 mmol/L (ref 96–106)
Creatinine, Ser: 0.82 mg/dL (ref 0.57–1.00)
GFR calc Af Amer: 91 mL/min/{1.73_m2} (ref >59)
GFR calc non Af Amer: 79 mL/min/{1.73_m2} (ref >59)
Globulin, Total: 3.1 g/dL (ref 1.5–4.5)
Glucose: 92 mg/dL (ref 65–99)
Potassium: 4.3 mmol/L (ref 3.5–5.2)
Sodium: 143 mmol/L (ref 134–144)
Total Protein: 7.6 g/dL (ref 6.0–8.5)

## 2019-05-02 LAB — CBC WITH DIFFERENTIAL/PLATELET
Basophils Absolute: 0.1 10*3/uL (ref 0.0–0.2)
Basos: 1 %
EOS (ABSOLUTE): 0.1 10*3/uL (ref 0.0–0.4)
Eos: 1 %
Hemoglobin: 13 g/dL (ref 11.1–15.9)
Immature Grans (Abs): 0 10*3/uL (ref 0.0–0.1)
Immature Granulocytes: 0 %
Lymphocytes Absolute: 2.5 10*3/uL (ref 0.7–3.1)
Lymphs: 34 %
MCH: 30.9 pg (ref 26.6–33.0)
MCHC: 32.5 g/dL (ref 31.5–35.7)
MCV: 95 fL (ref 79–97)
Monocytes Absolute: 0.5 10*3/uL (ref 0.1–0.9)
Monocytes: 7 %
Neutrophils Absolute: 4.1 10*3/uL (ref 1.4–7.0)
Neutrophils: 57 %
Platelets: 340 10*3/uL (ref 150–450)
RBC: 4.21 x10E6/uL (ref 3.77–5.28)
RDW: 12.9 % (ref 11.7–15.4)
WBC: 7.2 10*3/uL (ref 3.4–10.8)

## 2019-05-02 LAB — VITAMIN D 25 HYDROXY (VIT D DEFICIENCY, FRACTURES): Vit D, 25-Hydroxy: 51.6 ng/mL (ref 30.0–100.0)

## 2019-05-02 LAB — HEMOGLOBIN A1C
Est. average glucose Bld gHb Est-mCnc: 120 mg/dL
Hgb A1c MFr Bld: 5.8 % — ABNORMAL HIGH (ref 4.8–5.6)

## 2019-05-02 LAB — TSH: TSH: 1.99 u[IU]/mL (ref 0.450–4.500)

## 2019-05-02 LAB — T3: T3, Total: 112 ng/dL (ref 71–180)

## 2019-05-02 LAB — T4, FREE: Free T4: 1.09 ng/dL (ref 0.82–1.77)

## 2019-05-05 ENCOUNTER — Encounter: Payer: Self-pay | Admitting: Certified Nurse Midwife

## 2019-05-05 NOTE — Telephone Encounter (Signed)
Please advise. Thanks.  

## 2019-05-05 NOTE — Progress Notes (Signed)
Mammogram negative Density B  Repeat yearly

## 2019-05-14 DIAGNOSIS — T1511XA Foreign body in conjunctival sac, right eye, initial encounter: Secondary | ICD-10-CM | POA: Diagnosis not present

## 2019-05-15 ENCOUNTER — Other Ambulatory Visit: Payer: Self-pay

## 2019-05-15 ENCOUNTER — Encounter (INDEPENDENT_AMBULATORY_CARE_PROVIDER_SITE_OTHER): Payer: Self-pay | Admitting: Family Medicine

## 2019-05-15 ENCOUNTER — Ambulatory Visit (INDEPENDENT_AMBULATORY_CARE_PROVIDER_SITE_OTHER): Payer: BC Managed Care – PPO | Admitting: Family Medicine

## 2019-05-15 VITALS — BP 112/74 | HR 91 | Temp 98.4°F | Ht 68.0 in | Wt 210.0 lb

## 2019-05-15 DIAGNOSIS — G4700 Insomnia, unspecified: Secondary | ICD-10-CM

## 2019-05-15 DIAGNOSIS — E559 Vitamin D deficiency, unspecified: Secondary | ICD-10-CM | POA: Diagnosis not present

## 2019-05-15 DIAGNOSIS — Z9189 Other specified personal risk factors, not elsewhere classified: Secondary | ICD-10-CM

## 2019-05-15 DIAGNOSIS — R7303 Prediabetes: Secondary | ICD-10-CM | POA: Diagnosis not present

## 2019-05-15 DIAGNOSIS — Z6832 Body mass index (BMI) 32.0-32.9, adult: Secondary | ICD-10-CM

## 2019-05-15 DIAGNOSIS — E669 Obesity, unspecified: Secondary | ICD-10-CM

## 2019-05-15 MED ORDER — METFORMIN HCL 500 MG PO TABS: 500.0000 mg | ORAL_TABLET | Freq: Every day | ORAL | 0 refills | Status: DC

## 2019-05-15 MED ORDER — TRAZODONE HCL 50 MG PO TABS: 25.0000 mg | ORAL_TABLET | Freq: Every evening | ORAL | 0 refills | Status: DC | PRN

## 2019-05-15 NOTE — Progress Notes (Signed)
Chief Complaint:   OBESITY Crystal Simmons is here to discuss her progress with her obesity treatment plan along with follow-up of her obesity related diagnoses. Crystal Simmons is on keeping a food journal and adhering to recommended goals of 1000-1200 calories and 85+ grams of protein and states she is following her eating plan approximately 100% of the time. Crystal Simmons states she is walking 10,000 steps 5 times per week.  Today's visit was #: 2 Starting weight: 211 lbs Starting date: 05/01/2019 Today's weight: 210 lbs Today's date: 05/15/2019 Total lbs lost to date: 1 lb Total lbs lost since last in-office visit: 1 lb  Interim History: Crystal Simmons entered the program with great habits in place.  She drinks 64-82 ounces of water per day and gets 10,000 steps per day for exercise.  She makes good food choices that are low carb.  She reports that she is not sleeping well.  She says there have been some changes at work that have created increased anxiety for her.  Subjective:   1. Prediabetes Crystal Simmons has a diagnosis of prediabetes based on her elevated HgA1c and was informed this puts her at greater risk of developing diabetes. She continues to work on diet and exercise to decrease her risk of diabetes. She denies nausea or hypoglycemia.  She has a strong family history of diabetes mellitus.  Lab Results  Component Value Date   HGBA1C 5.8 (H) 05/01/2019   Lab Results  Component Value Date   INSULIN 8.9 05/01/2019   2. Vitamin D deficiency Crystal Simmons's Vitamin D level was 51.6 on 05/01/2019. She is currently taking OTC vitamin D each day. She denies nausea, vomiting or muscle weakness.  3. Insomnia Symptoms include: awakens early and anxiety.  Assessment/Plan:   1. Prediabetes Crystal Simmons will continue to work on weight loss, exercise, and decreasing simple carbohydrates to help decrease the risk of diabetes.   Orders - metFORMIN (GLUCOPHAGE) 500 MG tablet; Take 1 tablet (500 mg total) by mouth daily.   Dispense: 30 tablet; Refill: 0  2. Vitamin D deficiency Low Vitamin D level contributes to fatigue and are associated with obesity, breast, and colon cancer. She agrees to continue to take OTC Vitamin D daily and will follow-up for routine testing of Vitamin D, at least 2-3 times per year to avoid over-replacement.  3. Insomnia, unspecified type The problem of recurrent insomnia was discussed. Orders and follow up as documented in patient record. Counseling: Intensive lifestyle modifications are the first line treatment for this issue. We discussed several lifestyle modifications today and she will continue to work on diet, exercise and weight loss efforts.   Counseling  Limit or avoid alcohol, caffeinated beverages, and cigarettes, especially close to bedtime.   Do not eat a large meal or eat spicy foods right before bedtime. This can lead to digestive discomfort that can make it hard for you to sleep.  Keep a sleep diary to help you and your health care provider figure out what could be causing your insomnia.  . Make your bedroom a dark, comfortable place where it is easy to fall asleep. ? Put up shades or blackout curtains to block light from outside. ? Use a white noise machine to block noise. ? Keep the temperature cool. . Limit screen use before bedtime. This includes: ? Watching TV. ? Using your smartphone, tablet, or computer. . Stick to a routine that includes going to bed and waking up at the same times every day and night. This can help you  fall asleep faster. Consider making a quiet activity, such as reading, part of your nighttime routine. . Try to avoid taking naps during the day so that you sleep better at night. . Get out of bed if you are still awake after 15 minutes of trying to sleep. Keep the lights down, but try reading or doing a quiet activity. When you feel sleepy, go back to bed.  Orders - traZODone (DESYREL) 50 MG tablet; Take 0.5-1 tablets (25-50 mg total) by  mouth at bedtime as needed for sleep.  Dispense: 30 tablet; Refill: 0  4. At risk for diabetes mellitus Crystal Simmons was given approximately 15 minutes of diabetes education and counseling today. We discussed intensive lifestyle modifications today with an emphasis on weight loss as well as increasing exercise and decreasing simple carbohydrates in her diet. We also reviewed medication options with an emphasis on risk versus benefit of those discussed.   Repetitive spaced learning was employed today to elicit superior memory formation and behavioral change.  5. Class 1 obesity with serious comorbidity and body mass index (BMI) of 32.0 to 32.9 in adult, unspecified obesity type Crystal Simmons is currently in the action stage of change. As such, her goal is to continue with weight loss efforts. She has agreed to keeping a food journal and adhering to recommended goals of 1000-1200 calories and 85+ grams of protein.   Exercise goals: As is.  Behavioral modification strategies: increasing lean protein intake.  Crystal Simmons has agreed to follow-up with our clinic in 2 weeks. She was informed of the importance of frequent follow-up visits to maximize her success with intensive lifestyle modifications for her multiple health conditions.   Objective:   Blood pressure 112/74, pulse 91, temperature 98.4 F (36.9 C), temperature source Oral, height 5\' 8"  (1.727 m), weight 210 lb (95.3 kg), last menstrual period 10/14/2005, SpO2 97 %. Body mass index is 31.93 kg/m.  General: Cooperative, alert, well developed, in no acute distress. HEENT: Conjunctivae and lids unremarkable. Cardiovascular: Regular rhythm.  Lungs: Normal work of breathing. Neurologic: No focal deficits.   Lab Results  Component Value Date   CREATININE 0.82 05/01/2019   BUN 11 05/01/2019   NA 143 05/01/2019   K 4.3 05/01/2019   CL 105 05/01/2019   CO2 24 05/01/2019   Lab Results  Component Value Date   ALT 14 05/01/2019   AST 18 05/01/2019    ALKPHOS 112 05/01/2019   BILITOT 0.5 05/01/2019   Lab Results  Component Value Date   HGBA1C 5.8 (H) 05/01/2019   HGBA1C 5.4 12/13/2015   Lab Results  Component Value Date   INSULIN 8.9 05/01/2019   Lab Results  Component Value Date   TSH 1.990 05/01/2019   Lab Results  Component Value Date   CHOL 149 05/01/2019   HDL 60 05/01/2019   LDLCALC 76 05/01/2019   TRIG 63 05/01/2019   CHOLHDL 2.5 05/01/2019   Lab Results  Component Value Date   WBC 7.2 05/01/2019   HGB 13.0 05/01/2019   HCT 40.0 05/01/2019   MCV 95 05/01/2019   PLT 340 05/01/2019   Lab Results  Component Value Date   IRON 82 05/01/2019   TIBC 296 05/01/2019   FERRITIN 72 05/01/2019   Attestation Statements:   Reviewed by clinician on day of visit: allergies, medications, problem list, medical history, surgical history, family history, social history, and previous encounter notes.  I, Water quality scientist, CMA, am acting as Location manager for [employer], DO.  I have reviewed  the above documentation for accuracy and completeness, and I agree with the above. Briscoe Deutscher, DO   CPT Code 1) Number and Complexity of Problems Addressed 2) Amount and/or Complexity of Data to be Reviewed and Analyzed 3) Risk of Complications and/or Morbidity or Mortality of Patient Management  [phone]  or  [phone] Low  []   2 or more self-limited or minor problems []   1 stable chronic illness []   1 acute, uncomplicated illness or injury Limited (Must meet the requirements of at least 1 out of 2 categories)  Category 1:  Any combination of 2 from the following:  []   Review of prior external note(s) from unique source []   Review of the result(s) of unique test  []   Ordering of each unique test OR Category 2:  []   Assessment requiring an independent historian Low risk of morbidity from additional diagnostic testing or treatment  99204  or  99214 Moderate  [x]   1 or more chronic illnesses with exacerbation, progression, or side  effects of treatment [x]   2 or more stable chronic illnesses []   1 undiagnosed new problem with uncertain prognosis []   1 acute illness with systemic symptoms []   1 acute complicated injury Moderate (Must meet the requirements of at least 1 out of 3 categories)  Category 1:  Any combination of 3 from the following: []   Review of prior external note(s) from unique source []   Review of the result(s) of unique test []   Ordering of each test []   Assessment requiring an independent historian OR Category 2:  []   Independent interpretation of tests Independent interpretation of a test performed by another physician/other qualified health care professional (not separately reported)  OR Category 3:  []   Discussion of management or test interpretation  Moderate risk of morbidity from additional diagnostic testing or treatment   Examples:  [x]   Prescription drug management  []   Diagnosis or treatment significantly limited by social determinants of health

## 2019-05-19 DIAGNOSIS — T1512XA Foreign body in conjunctival sac, left eye, initial encounter: Secondary | ICD-10-CM | POA: Diagnosis not present

## 2019-06-02 ENCOUNTER — Encounter (INDEPENDENT_AMBULATORY_CARE_PROVIDER_SITE_OTHER): Payer: Self-pay | Admitting: Family Medicine

## 2019-06-02 ENCOUNTER — Other Ambulatory Visit: Payer: Self-pay

## 2019-06-02 ENCOUNTER — Ambulatory Visit (INDEPENDENT_AMBULATORY_CARE_PROVIDER_SITE_OTHER): Payer: BC Managed Care – PPO | Admitting: Family Medicine

## 2019-06-02 VITALS — BP 119/80 | HR 81 | Temp 98.0°F | Ht 68.0 in | Wt 210.0 lb

## 2019-06-02 DIAGNOSIS — J329 Chronic sinusitis, unspecified: Secondary | ICD-10-CM | POA: Diagnosis not present

## 2019-06-02 DIAGNOSIS — J301 Allergic rhinitis due to pollen: Secondary | ICD-10-CM | POA: Diagnosis not present

## 2019-06-02 DIAGNOSIS — Z9189 Other specified personal risk factors, not elsewhere classified: Secondary | ICD-10-CM | POA: Diagnosis not present

## 2019-06-02 DIAGNOSIS — B9689 Other specified bacterial agents as the cause of diseases classified elsewhere: Secondary | ICD-10-CM

## 2019-06-02 DIAGNOSIS — R7303 Prediabetes: Secondary | ICD-10-CM

## 2019-06-02 DIAGNOSIS — Z6832 Body mass index (BMI) 32.0-32.9, adult: Secondary | ICD-10-CM

## 2019-06-02 DIAGNOSIS — F5104 Psychophysiologic insomnia: Secondary | ICD-10-CM | POA: Diagnosis not present

## 2019-06-02 DIAGNOSIS — E669 Obesity, unspecified: Secondary | ICD-10-CM

## 2019-06-03 ENCOUNTER — Encounter (INDEPENDENT_AMBULATORY_CARE_PROVIDER_SITE_OTHER): Payer: Self-pay | Admitting: Family Medicine

## 2019-06-03 MED ORDER — AMOXICILLIN 875 MG PO TABS: 875.0000 mg | ORAL_TABLET | Freq: Two times a day (BID) | ORAL | 0 refills | Status: DC

## 2019-06-03 MED ORDER — MONTELUKAST SODIUM 10 MG PO TABS: 10.0000 mg | ORAL_TABLET | Freq: Every day | ORAL | 0 refills | Status: DC

## 2019-06-05 NOTE — Progress Notes (Signed)
Chief Complaint:   OBESITY Crystal Simmons is here to discuss her progress with her obesity treatment plan along with follow-up of her obesity related diagnoses. Crystal Simmons is on keeping a food journal and adhering to recommended goals of 1000-1200 calories and 85+ grams of protein and states she is following her eating plan approximately 60% of the time. Crystal Simmons states she is doing cardio/weights/11,000 steps 5 times per week.  Today's visit was #: 3 Starting weight: 211 lbs Starting date: 05/01/2019 Today's weight: 210 lbs Today's date: 06/02/2019 Total lbs lost to date: 1 lb Total lbs lost since last in-office visit: 0  Interim History: Crystal Simmons did not eat enough while she was on vacation.  She says she was too scared of gaining weight.  She reports that she is having sinus drainage that is causing nausea, which has also decreased her food intake as well.  Subjective:   1. Seasonal allergic rhinitis due to pollen She has been taking an antihistamines and Flonase.  2. Bacterial sinusitis This has been going on for greater than 1 week.  Endorses fatigue.  Left-sided with thick drainage.  3. Prediabetes Lyrica will continue to work on weight loss, exercise, and decreasing simple carbohydrates to help decrease the risk of diabetes.  She started metformin at last visit.  She is tolerating it well.  4. Psychophysiological insomnia Trialed trazodone (Rx at last visit).  She reports 5-6 hours of sleep now.  Still waking at 5-6 am.  Assessment/Plan:   1. Seasonal allergic rhinitis due to pollen Will start Crystal Simmons on Singulair, as per below.  Orders - montelukast (SINGULAIR) 10 MG tablet; Take 1 tablet (10 mg total) by mouth at bedtime.  Dispense: 30 tablet; Refill: 0   2. Bacterial sinusitis The patient has evidence of subacute bacterial sinusitis, based on symptoms and timeline. After discussion, patient would like to start below medication. Expectations, risks, and potential side effects  reviewed.   Orders - amoxicillin (AMOXIL) 875 MG tablet; Take 1 tablet (875 mg total) by mouth 2 (two) times daily.  Dispense: 20 tablet; Refill: 0  3. Prediabetes Crystal Simmons will continue to work on weight loss, exercise, and decreasing simple carbohydrates to help decrease the risk of diabetes.  Continue metformin.  4. Psychophysiological insomnia Crystal Simmons will increase trazodone to 1.5 tablets at bedtime to see if she can increase her sleep time to 7-8 hours/night.   5. At risk for deficient intake of food Crystal Simmons was given approximately 15 minutes of deficit intake of food prevention counseling today. Crystal Simmons is at risk for eating too few calories based on current food recall. She was encouraged to focus on meeting caloric and protein goals according to her recommended meal plan.   6. Class 1 obesity with serious comorbidity and body mass index (BMI) of 32.0 to 32.9 in adult, unspecified obesity type Crystal Simmons is currently in the action stage of change. As such, her goal is to continue with weight loss efforts. She has agreed to keeping a food journal and adhering to recommended goals of 1000-1200 calories and 85+ grams of protein.   Exercise goals: 10,000 steps a day.  Behavioral modification strategies: emotional eating strategies and planning for success.  Crystal Simmons has agreed to follow-up with our clinic in 2 weeks. She was informed of the importance of frequent follow-up visits to maximize her success with intensive lifestyle modifications for her multiple health conditions.   Objective:   Blood pressure 119/80, pulse 81, temperature 98 F (36.7 C), temperature source Oral,  height 5\' 8"  (1.727 m), weight 210 lb (95.3 kg), last menstrual period 10/14/2005, SpO2 96 %. Body mass index is 31.93 kg/m.  General: Cooperative, alert, well developed, in no acute distress. HEENT: Conjunctivae and lids unremarkable. Cardiovascular: Regular rhythm.  Lungs: Normal work of breathing. Neurologic: No  focal deficits.   Lab Results  Component Value Date   CREATININE 0.82 05/01/2019   BUN 11 05/01/2019   NA 143 05/01/2019   K 4.3 05/01/2019   CL 105 05/01/2019   CO2 24 05/01/2019   Lab Results  Component Value Date   ALT 14 05/01/2019   AST 18 05/01/2019   ALKPHOS 112 05/01/2019   BILITOT 0.5 05/01/2019   Lab Results  Component Value Date   HGBA1C 5.8 (H) 05/01/2019   HGBA1C 5.4 12/13/2015   Lab Results  Component Value Date   INSULIN 8.9 05/01/2019   Lab Results  Component Value Date   TSH 1.990 05/01/2019   Lab Results  Component Value Date   CHOL 149 05/01/2019   HDL 60 05/01/2019   LDLCALC 76 05/01/2019   TRIG 63 05/01/2019   CHOLHDL 2.5 05/01/2019   Lab Results  Component Value Date   WBC 7.2 05/01/2019   HGB 13.0 05/01/2019   HCT 40.0 05/01/2019   MCV 95 05/01/2019   PLT 340 05/01/2019   Lab Results  Component Value Date   IRON 82 05/01/2019   TIBC 296 05/01/2019   FERRITIN 72 05/01/2019   Attestation Statements:   Reviewed by clinician on day of visit: allergies, medications, problem list, medical history, surgical history, family history, social history, and previous encounter notes.  I, Water quality scientist, CMA, am acting as Location manager for [employer], DO.  I have reviewed the above documentation for accuracy and completeness, and I agree with the above. Briscoe Deutscher, DO

## 2019-06-16 ENCOUNTER — Encounter (INDEPENDENT_AMBULATORY_CARE_PROVIDER_SITE_OTHER): Payer: Self-pay | Admitting: Family Medicine

## 2019-06-17 NOTE — Telephone Encounter (Signed)
Please advise. Thanks.  

## 2019-06-20 ENCOUNTER — Encounter (INDEPENDENT_AMBULATORY_CARE_PROVIDER_SITE_OTHER): Payer: Self-pay | Admitting: Family Medicine

## 2019-06-25 ENCOUNTER — Ambulatory Visit (INDEPENDENT_AMBULATORY_CARE_PROVIDER_SITE_OTHER): Payer: BC Managed Care – PPO | Admitting: Family Medicine

## 2019-07-15 ENCOUNTER — Encounter (INDEPENDENT_AMBULATORY_CARE_PROVIDER_SITE_OTHER): Payer: Self-pay | Admitting: Physician Assistant

## 2019-07-15 ENCOUNTER — Other Ambulatory Visit: Payer: Self-pay

## 2019-07-15 ENCOUNTER — Ambulatory Visit (INDEPENDENT_AMBULATORY_CARE_PROVIDER_SITE_OTHER): Payer: BC Managed Care – PPO | Admitting: Physician Assistant

## 2019-07-15 VITALS — BP 119/84 | HR 85 | Temp 98.4°F | Ht 68.0 in | Wt 206.0 lb

## 2019-07-15 DIAGNOSIS — E669 Obesity, unspecified: Secondary | ICD-10-CM

## 2019-07-15 DIAGNOSIS — G4700 Insomnia, unspecified: Secondary | ICD-10-CM

## 2019-07-15 DIAGNOSIS — Z9189 Other specified personal risk factors, not elsewhere classified: Secondary | ICD-10-CM

## 2019-07-15 DIAGNOSIS — R7303 Prediabetes: Secondary | ICD-10-CM | POA: Diagnosis not present

## 2019-07-15 DIAGNOSIS — Z6831 Body mass index (BMI) 31.0-31.9, adult: Secondary | ICD-10-CM

## 2019-07-15 MED ORDER — TRAZODONE HCL 50 MG PO TABS: 25.0000 mg | ORAL_TABLET | Freq: Every evening | ORAL | 0 refills | Status: DC | PRN

## 2019-07-15 MED ORDER — METFORMIN HCL 500 MG PO TABS: 500.0000 mg | ORAL_TABLET | Freq: Every day | ORAL | 0 refills | Status: DC

## 2019-07-15 NOTE — Progress Notes (Signed)
Chief Complaint:   OBESITY Crystal Simmons is here to discuss her progress with her obesity treatment plan along with follow-up of her obesity related diagnoses. Crystal Simmons is on keeping a food journal and adhering to recommended goals of 1000-1200 calories and 85+ grams of protein and states she is following her eating plan approximately 90% of the time. Crystal Simmons states she is walking and using the elliptical 5 times per week.  Today's visit was #: 4 Starting weight: 211 lbs Starting date: 05/01/2019 Today's weight: 206 lbs Today's date: 07/15/2019 Total lbs lost to date: 5 lbs Total lbs lost since last in-office visit: 4 lbs  Interim History: Crystal Simmons that her hunger has been controlled for the most part.  She is averaging about 1100 calories daily but sometimes is only getting around 70 grams of protein.  Subjective:   1. Prediabetes Crystal Simmons has a diagnosis of prediabetes based on her elevated HgA1c and was informed this puts her at greater risk of developing diabetes. She continues to work on diet and exercise to decrease her risk of diabetes. She denies nausea or hypoglycemia.  Crystal Simmons ran out of metformin 2 weeks ago.  She had no nausea, vomiting, or diarrhea when she was taking it.  Lab Results  Component Value Date   HGBA1C 5.8 (H) 05/01/2019   Lab Results  Component Value Date   INSULIN 8.9 05/01/2019   2. Insomnia, unspecified type Crystal Simmons is taking trazodone and states it helps with sleep.  3. At risk for diabetes mellitus Crystal Simmons is at higher than average risk for developing diabetes due to her obesity.   Assessment/Plan:   1. Prediabetes Crystal Simmons will continue to work on weight loss, exercise, and decreasing simple carbohydrates to help decrease the risk of diabetes.  - metFORMIN (GLUCOPHAGE) 500 MG tablet; Take 1 tablet (500 mg total) by mouth daily.  Dispense: 30 tablet; Refill: 0  2. Insomnia, unspecified type The problem of recurrent insomnia was discussed. Orders and  follow up as documented in patient record. Counseling: Intensive lifestyle modifications are the first line treatment for this issue. We discussed several lifestyle modifications today and she will continue to work on diet, exercise and weight loss efforts.   Counseling  Limit or avoid alcohol, caffeinated beverages, and cigarettes, especially close to bedtime.   Do not eat a large meal or eat spicy foods right before bedtime. This can lead to digestive discomfort that can make it hard for you to sleep.  Keep a sleep diary to help you and your health care provider figure out what could be causing your insomnia.   Make your bedroom a dark, comfortable place where it is easy to fall asleep. ? Put up shades or blackout curtains to block light from outside. ? Use a white noise machine to block noise. ? Keep the temperature cool.  Limit screen use before bedtime. This includes: ? Watching TV. ? Using your smartphone, tablet, or computer.  Stick to a routine that includes going to bed and waking up at the same times every day and night. This can help you fall asleep faster. Consider making a quiet activity, such as reading, part of your nighttime routine.  Try to avoid taking naps during the day so that you sleep better at night.  Get out of bed if you are still awake after 15 minutes of trying to sleep. Keep the lights down, but try reading or doing a quiet activity. When you feel sleepy, go back to bed. -  traZODone (DESYREL) 50 MG tablet; Take 0.5-1 tablets (25-50 mg total) by mouth at bedtime as needed for sleep.  Dispense: 30 tablet; Refill: 0  3. At risk for diabetes mellitus Crystal Simmons was given approximately 15 minutes of diabetes education and counseling today. We discussed intensive lifestyle modifications today with an emphasis on weight loss as well as increasing exercise and decreasing simple carbohydrates in her diet. We also reviewed medication options with an emphasis on risk versus  benefit of those discussed.   Repetitive spaced learning was employed today to elicit superior memory formation and behavioral change.  4. Class 1 obesity with serious comorbidity and body mass index (BMI) of 31.0 to 31.9 in adult, unspecified obesity type Crystal Simmons is currently in the action stage of change. As such, her goal is to continue with weight loss efforts. She has agreed to keeping a food journal and adhering to recommended goals of 1000-1200 calories and 85 grams of protein.   Exercise goals: As is.  Behavioral modification strategies: increasing lean protein intake and meal planning and cooking strategies.  Crystal Simmons has agreed to follow-up with our clinic in 2-3 weeks. She was informed of the importance of frequent follow-up visits to maximize her success with intensive lifestyle modifications for her multiple health conditions.   Objective:   Blood pressure 119/84, pulse 85, temperature 98.4 F (36.9 C), temperature source Oral, height 5\' 8"  (1.727 m), weight 206 lb (93.4 kg), last menstrual period 10/14/2005, SpO2 97 %. Body mass index is 31.32 kg/m.  General: Cooperative, alert, well developed, in no acute distress. HEENT: Conjunctivae and lids unremarkable. Cardiovascular: Regular rhythm.  Lungs: Normal work of breathing. Neurologic: No focal deficits.   Lab Results  Component Value Date   CREATININE 0.82 05/01/2019   BUN 11 05/01/2019   NA 143 05/01/2019   K 4.3 05/01/2019   CL 105 05/01/2019   CO2 24 05/01/2019   Lab Results  Component Value Date   ALT 14 05/01/2019   AST 18 05/01/2019   ALKPHOS 112 05/01/2019   BILITOT 0.5 05/01/2019   Lab Results  Component Value Date   HGBA1C 5.8 (H) 05/01/2019   HGBA1C 5.4 12/13/2015   Lab Results  Component Value Date   INSULIN 8.9 05/01/2019   Lab Results  Component Value Date   TSH 1.990 05/01/2019   Lab Results  Component Value Date   CHOL 149 05/01/2019   HDL 60 05/01/2019   LDLCALC 76 05/01/2019    TRIG 63 05/01/2019   CHOLHDL 2.5 05/01/2019   Lab Results  Component Value Date   WBC 7.2 05/01/2019   HGB 13.0 05/01/2019   HCT 40.0 05/01/2019   MCV 95 05/01/2019   PLT 340 05/01/2019   Lab Results  Component Value Date   IRON 82 05/01/2019   TIBC 296 05/01/2019   FERRITIN 72 05/01/2019   Attestation Statements:   Reviewed by clinician on day of visit: allergies, medications, problem list, medical history, surgical history, family history, social history, and previous encounter notes.  I, Water quality scientist, CMA, am acting as Location manager for [employer], PA-C.  I have reviewed the above documentation for accuracy and completeness, and I agree with the above. Abby Potash, PA-C

## 2019-08-05 ENCOUNTER — Ambulatory Visit (INDEPENDENT_AMBULATORY_CARE_PROVIDER_SITE_OTHER): Payer: BC Managed Care – PPO | Admitting: Physician Assistant

## 2019-08-05 ENCOUNTER — Other Ambulatory Visit: Payer: Self-pay

## 2019-08-05 VITALS — BP 111/78 | HR 94 | Temp 98.1°F | Ht 68.0 in | Wt 204.0 lb

## 2019-08-05 DIAGNOSIS — R7303 Prediabetes: Secondary | ICD-10-CM | POA: Diagnosis not present

## 2019-08-05 DIAGNOSIS — Z6831 Body mass index (BMI) 31.0-31.9, adult: Secondary | ICD-10-CM

## 2019-08-05 DIAGNOSIS — E669 Obesity, unspecified: Secondary | ICD-10-CM | POA: Diagnosis not present

## 2019-08-05 NOTE — Progress Notes (Signed)
Chief Complaint:   OBESITY Ha Shannahan is here to discuss her progress with her obesity treatment plan along with follow-up of her obesity related diagnoses. Yari is keeping a food journal and adhering to recommended goals of 1000-1200 calories and 85 grams of protein and states she is following her eating plan approximately 80% of the time. Darthy states she is walking 60-90 minutes 5 times per week.  Today's visit was #: 5 Starting weight: 211 lbs Starting date: 05/01/2019 Today's weight: 204 lbs Today's date: 08/05/2019 Total lbs lost to date: 7 Total lbs lost since last in-office visit: 2  Interim History: Anwita is doing very well with weight loss. She continues to struggle to hit her protein goal daily and on some days averages 60-65 grams.  Subjective:   Prediabetes. Lyncoln has a diagnosis of prediabetes based on her elevated HgA1c and was informed this puts her at greater risk of developing diabetes. She continues to work on diet and exercise to decrease her risk of diabetes. She denies nausea or hypoglycemia. [name] reports metformin makes her feel slightly queasy periodically.  Lab Results  Component Value Date   HGBA1C 5.8 (H) 05/01/2019   Lab Results  Component Value Date   INSULIN 8.9 05/01/2019   Assessment/Plan:   Prediabetes. Jhayla will continue to work on weight loss, exercise, and decreasing simple carbohydrates to help decrease the risk of diabetes. She will take metformin with dinner.  Class 1 obesity with serious comorbidity and body mass index (BMI) of 31.0 to 31.9 in adult, unspecified obesity type.  Havannah is currently in the action stage of change. As such, her goal is to continue with weight loss efforts. She has agreed to keeping a food journal and adhering to recommended goals of 1000-1200 calories and 85 grams of protein daily.   Exercise goals: For substantial health benefits, adults should do at least 150 minutes (2 hours and 30 minutes) a  week of moderate-intensity, or 75 minutes (1 hour and 15 minutes) a week of vigorous-intensity aerobic physical activity, or an equivalent combination of moderate- and vigorous-intensity aerobic activity. Aerobic activity should be performed in episodes of at least 10 minutes, and preferably, it should be spread throughout the week.  Behavioral modification strategies: meal planning and cooking strategies and keeping healthy foods in the home.  Affie has agreed to follow-up with our clinic in 3 weeks. She was informed of the importance of frequent follow-up visits to maximize her success with intensive lifestyle modifications for her multiple health conditions.   Objective:   Blood pressure 111/78, pulse 94, temperature 98.1 F (36.7 C), temperature source Oral, height 5\' 8"  (1.727 m), weight 204 lb (92.5 kg), last menstrual period 10/14/2005, SpO2 99 %. Body mass index is 31.02 kg/m.  General: Cooperative, alert, well developed, in no acute distress. HEENT: Conjunctivae and lids unremarkable. Cardiovascular: Regular rhythm.  Lungs: Normal work of breathing. Neurologic: No focal deficits.   Lab Results  Component Value Date   CREATININE 0.82 05/01/2019   BUN 11 05/01/2019   NA 143 05/01/2019   K 4.3 05/01/2019   CL 105 05/01/2019   CO2 24 05/01/2019   Lab Results  Component Value Date   ALT 14 05/01/2019   AST 18 05/01/2019   ALKPHOS 112 05/01/2019   BILITOT 0.5 05/01/2019   Lab Results  Component Value Date   HGBA1C 5.8 (H) 05/01/2019   HGBA1C 5.4 12/13/2015   Lab Results  Component Value Date   INSULIN 8.9  05/01/2019   Lab Results  Component Value Date   TSH 1.990 05/01/2019   Lab Results  Component Value Date   CHOL 149 05/01/2019   HDL 60 05/01/2019   LDLCALC 76 05/01/2019   TRIG 63 05/01/2019   CHOLHDL 2.5 05/01/2019   Lab Results  Component Value Date   WBC 7.2 05/01/2019   HGB 13.0 05/01/2019   HCT 40.0 05/01/2019   MCV 95 05/01/2019   PLT 340  05/01/2019   Lab Results  Component Value Date   IRON 82 05/01/2019   TIBC 296 05/01/2019   FERRITIN 72 05/01/2019   Attestation Statements:   Reviewed by clinician on day of visit: allergies, medications, problem list, medical history, surgical history, family history, social history, and previous encounter notes.  Time spent on visit including pre-visit chart review and post-visit charting and care was 30 minutes.   IMichaelene Song, am acting as transcriptionist for Abby Potash, PA-C   I have reviewed the above documentation for accuracy and completeness, and I agree with the above. Abby Potash, PA-C

## 2019-08-24 ENCOUNTER — Encounter (INDEPENDENT_AMBULATORY_CARE_PROVIDER_SITE_OTHER): Payer: Self-pay | Admitting: Physician Assistant

## 2019-09-01 ENCOUNTER — Encounter (INDEPENDENT_AMBULATORY_CARE_PROVIDER_SITE_OTHER): Payer: Self-pay | Admitting: Physician Assistant

## 2019-09-01 ENCOUNTER — Other Ambulatory Visit: Payer: Self-pay

## 2019-09-01 ENCOUNTER — Ambulatory Visit (INDEPENDENT_AMBULATORY_CARE_PROVIDER_SITE_OTHER): Payer: BC Managed Care – PPO | Admitting: Physician Assistant

## 2019-09-01 VITALS — BP 118/81 | HR 86 | Temp 98.4°F | Ht 68.0 in | Wt 202.0 lb

## 2019-09-01 DIAGNOSIS — Z6830 Body mass index (BMI) 30.0-30.9, adult: Secondary | ICD-10-CM

## 2019-09-01 DIAGNOSIS — E559 Vitamin D deficiency, unspecified: Secondary | ICD-10-CM | POA: Diagnosis not present

## 2019-09-01 DIAGNOSIS — E669 Obesity, unspecified: Secondary | ICD-10-CM

## 2019-09-01 DIAGNOSIS — Z9189 Other specified personal risk factors, not elsewhere classified: Secondary | ICD-10-CM | POA: Diagnosis not present

## 2019-09-01 DIAGNOSIS — K5909 Other constipation: Secondary | ICD-10-CM

## 2019-09-01 DIAGNOSIS — R7303 Prediabetes: Secondary | ICD-10-CM | POA: Diagnosis not present

## 2019-09-01 MED ORDER — POLYETHYLENE GLYCOL 3350 17 G PO PACK: 17.0000 g | PACK | Freq: Every day | ORAL | 0 refills | Status: DC

## 2019-09-01 NOTE — Progress Notes (Signed)
Chief Complaint:   OBESITY Crystal Simmons is here to discuss her progress with her obesity treatment plan along with follow-up of her obesity related diagnoses. Crystal Simmons is keeping a food journal and adhering to recommended goals of 1000-1200 calories and 85 grams of protein and states she is following her eating plan approximately 85% of the time. Crystal Simmons states she is doing exercise videos/cardio 60-90 minutes 5 times per week.  Today's visit was #: 6 Starting weight: 211 lbs Starting date: 05/01/2019 Today's weight: 202 lbs Today's date: 09/01/2019 Total lbs lost to date: 9 Total lbs lost since last in-office visit: 2  Interim History: Crystal Simmons is doing a better job reaching her protein goal daily. She reports staying around 1200 calories daily. She does find herself hungry intermittently.  Subjective:   Other constipation. Crystal Simmons ran out of Miralax one week ago.  Prediabetes. Crystal Simmons has a diagnosis of prediabetes based on her elevated HgA1c and was informed this puts her at greater risk of developing diabetes. She continues to work on diet and exercise to decrease her risk of diabetes. She denies nausea or hypoglycemia. Crystal Simmons stopped taking metformin 2 weeks ago due to GI upset. Last A1c 5.8 on 05/01/2019.  Lab Results  Component Value Date   HGBA1C 5.8 (H) 05/01/2019   Lab Results  Component Value Date   INSULIN 8.9 05/01/2019   Vitamin D deficiency. Crystal Simmons is on OTC Vitamin D supplementation 1,000 IU daily.   Ref. Range 05/01/2019 16:24  Vitamin D, 25-Hydroxy Latest Ref Range: 30.0 - 100.0 ng/mL 51.6   At risk for heart disease. Crystal Simmons is at a higher than average risk for cardiovascular disease due to obesity.   Assessment/Plan:   Other constipation. Crystal Simmons was informed that a decrease in bowel movement frequency is normal while losing weight, but stools should not be hard or painful. Orders and follow up as documented in patient record. Refill was given for polyethylene  glycol (MIRALAX / GLYCOLAX) 17 g packet #15 days only.  Counseling Getting to Good Bowel Health: Your goal is to have one soft bowel movement each day. Drink at least 8 glasses of water each day. Eat plenty of fiber (goal is over 25 grams each day). It is best to get most of your fiber from dietary sources which includes leafy green vegetables, fresh fruit, and whole grains. You may need to add fiber with the help of OTC fiber supplements. These include Metamucil, Citrucel, and Flaxseed. If you are still having trouble, try adding Miralax or Magnesium Citrate. If all of these changes do not work, Cabin crew.   Prediabetes. Crystal Simmons will continue to work on weight loss, exercise, and decreasing simple carbohydrates to help decrease the risk of diabetes. Comprehensive metabolic panel, Hemoglobin A1c, Insulin, random labs will be checked today.  Vitamin D deficiency. Low Vitamin D level contributes to fatigue and are associated with obesity, breast, and colon cancer. She agrees to continue to take OTC Vitamin D as directed and VITAMIN D 25 Hydroxy (Vit-D Deficiency, Fractures) level will be checked today.  At risk for heart disease. Elsy was given approximately 15 minutes of coronary artery disease prevention counseling today. She is 59 y.o. female and has risk factors for heart disease including obesity. We discussed intensive lifestyle modifications today with an emphasis on specific weight loss instructions and strategies.   Repetitive spaced learning was employed today to elicit superior memory formation and behavioral change.  Class 1 obesity with serious comorbidity and body mass  index (BMI) of 30.0 to 30.9 in adult, unspecified obesity type.  Crystal Simmons is currently in the action stage of change. As such, her goal is to continue with weight loss efforts. She has agreed to keeping a food journal and adhering to recommended goals of 1200 calories and 85 grams of protein daily.   Exercise  goals: For substantial health benefits, adults should do at least 150 minutes (2 hours and 30 minutes) a week of moderate-intensity, or 75 minutes (1 hour and 15 minutes) a week of vigorous-intensity aerobic physical activity, or an equivalent combination of moderate- and vigorous-intensity aerobic activity. Aerobic activity should be performed in episodes of at least 10 minutes, and preferably, it should be spread throughout the week.  Behavioral modification strategies: meal planning and cooking strategies and keeping healthy foods in the home.  Crystal Simmons has agreed to follow-up with our clinic in 3-4 weeks. She was informed of the importance of frequent follow-up visits to maximize her success with intensive lifestyle modifications for her multiple health conditions.   Crystal Simmons was informed we would discuss her lab results at her next visit unless there is a critical issue that needs to be addressed sooner. Crystal Simmons agreed to keep her next visit at the agreed upon time to discuss these results.  Objective:   Blood pressure 118/81, pulse 86, temperature 98.4 F (36.9 C), temperature source Oral, height 5\' 8"  (1.727 m), weight 202 lb (91.6 kg), last menstrual period 10/14/2005, SpO2 99 %. Body mass index is 30.71 kg/m.  General: Cooperative, alert, well developed, in no acute distress. HEENT: Conjunctivae and lids unremarkable. Cardiovascular: Regular rhythm.  Lungs: Normal work of breathing. Neurologic: No focal deficits.   Lab Results  Component Value Date   CREATININE 0.82 05/01/2019   BUN 11 05/01/2019   NA 143 05/01/2019   K 4.3 05/01/2019   CL 105 05/01/2019   CO2 24 05/01/2019   Lab Results  Component Value Date   ALT 14 05/01/2019   AST 18 05/01/2019   ALKPHOS 112 05/01/2019   BILITOT 0.5 05/01/2019   Lab Results  Component Value Date   HGBA1C 5.8 (H) 05/01/2019   HGBA1C 5.4 12/13/2015   Lab Results  Component Value Date   INSULIN 8.9 05/01/2019   Lab Results    Component Value Date   TSH 1.990 05/01/2019   Lab Results  Component Value Date   CHOL 149 05/01/2019   HDL 60 05/01/2019   LDLCALC 76 05/01/2019   TRIG 63 05/01/2019   CHOLHDL 2.5 05/01/2019   Lab Results  Component Value Date   WBC 7.2 05/01/2019   HGB 13.0 05/01/2019   HCT 40.0 05/01/2019   MCV 95 05/01/2019   PLT 340 05/01/2019   Lab Results  Component Value Date   IRON 82 05/01/2019   TIBC 296 05/01/2019   FERRITIN 72 05/01/2019   Attestation Statements:   Reviewed by clinician on day of visit: allergies, medications, problem list, medical history, surgical history, family history, social history, and previous encounter notes.  IMichaelene Song, am acting as transcriptionist for Abby Potash, PA-C   I have reviewed the above documentation for accuracy and completeness, and I agree with the above. Abby Potash, PA-C

## 2019-09-02 LAB — COMPREHENSIVE METABOLIC PANEL
ALT: 12 IU/L (ref 0–32)
AST: 16 IU/L (ref 0–40)
Albumin/Globulin Ratio: 1.2 (ref 1.2–2.2)
Albumin: 4 g/dL (ref 3.8–4.9)
Alkaline Phosphatase: 101 IU/L (ref 48–121)
BUN/Creatinine Ratio: 13 (ref 9–23)
BUN: 13 mg/dL (ref 6–24)
Bilirubin Total: 0.6 mg/dL (ref 0.0–1.2)
CO2: 24 mmol/L (ref 20–29)
Calcium: 9.3 mg/dL (ref 8.7–10.2)
Chloride: 105 mmol/L (ref 96–106)
Creatinine, Ser: 0.97 mg/dL (ref 0.57–1.00)
GFR calc Af Amer: 74 mL/min/{1.73_m2} (ref >59)
GFR calc non Af Amer: 64 mL/min/{1.73_m2} (ref >59)
Globulin, Total: 3.3 g/dL (ref 1.5–4.5)
Glucose: 98 mg/dL (ref 65–99)
Potassium: 4.3 mmol/L (ref 3.5–5.2)
Sodium: 141 mmol/L (ref 134–144)
Total Protein: 7.3 g/dL (ref 6.0–8.5)

## 2019-09-02 LAB — HEMOGLOBIN A1C
Est. average glucose Bld gHb Est-mCnc: 123 mg/dL
Hgb A1c MFr Bld: 5.9 % — ABNORMAL HIGH (ref 4.8–5.6)

## 2019-09-02 LAB — INSULIN, RANDOM: INSULIN: 11 u[IU]/mL (ref 2.6–24.9)

## 2019-09-02 LAB — VITAMIN D 25 HYDROXY (VIT D DEFICIENCY, FRACTURES): Vit D, 25-Hydroxy: 41.6 ng/mL (ref 30.0–100.0)

## 2019-09-08 DIAGNOSIS — S60151A Contusion of right little finger with damage to nail, initial encounter: Secondary | ICD-10-CM | POA: Diagnosis not present

## 2019-09-29 ENCOUNTER — Encounter (INDEPENDENT_AMBULATORY_CARE_PROVIDER_SITE_OTHER): Payer: Self-pay | Admitting: Physician Assistant

## 2019-09-29 ENCOUNTER — Ambulatory Visit (INDEPENDENT_AMBULATORY_CARE_PROVIDER_SITE_OTHER): Payer: BC Managed Care – PPO | Admitting: Physician Assistant

## 2019-09-29 ENCOUNTER — Other Ambulatory Visit: Payer: Self-pay

## 2019-09-29 VITALS — BP 111/79 | HR 73 | Temp 98.0°F | Ht 68.0 in | Wt 202.0 lb

## 2019-09-29 DIAGNOSIS — R7303 Prediabetes: Secondary | ICD-10-CM

## 2019-09-29 DIAGNOSIS — Z6830 Body mass index (BMI) 30.0-30.9, adult: Secondary | ICD-10-CM

## 2019-09-29 DIAGNOSIS — E669 Obesity, unspecified: Secondary | ICD-10-CM | POA: Diagnosis not present

## 2019-09-29 DIAGNOSIS — E559 Vitamin D deficiency, unspecified: Secondary | ICD-10-CM

## 2019-09-29 DIAGNOSIS — Z9189 Other specified personal risk factors, not elsewhere classified: Secondary | ICD-10-CM

## 2019-09-30 NOTE — Progress Notes (Signed)
Chief Complaint:   OBESITY Crystal Simmons is here to discuss her progress with her obesity treatment plan along with follow-up of her obesity related diagnoses. Crystal Simmons is keeping a food journal and adhering to recommended goals of 1000-1200 calories and 85 grams of protein and states she is following her eating plan approximately 85% of the time. Crystal Simmons states she is exercising on the elliptical/walking/videos 60 minutes 5 times per week.  Today's visit was #: 7 Starting weight: 211 lbs Starting date: 05/01/2019 Today's weight: 202 lbs Today's date: 09/29/2019 Total lbs lost to date: 9 Total lbs lost since last in-office visit: 0  Interim History: Crystal Simmons states that a friend passed away recently and she stress ate potato chips. She has been tracking her food and not always meeting her protein goal. She notes increased hunger, especially at night.  Subjective:   Vitamin D deficiency. Crystal Simmons is on OTC Vitamin D 1,000 units daily and OTC multivitamin with Vitamin D.   Ref. Range 09/01/2019 14:31  Vitamin D, 25-Hydroxy Latest Ref Range: 30.0 - 100.0 ng/mL 41.6   Prediabetes. Crystal Simmons has a diagnosis of prediabetes based on her elevated HgA1c and was informed this puts her at greater risk of developing diabetes. She continues to work on diet and exercise to decrease her risk of diabetes. She denies nausea or hypoglycemia. Crystal Simmons stopped metformin after labs as she didn't think she needed it any longer. She notes polyphagia.  Lab Results  Component Value Date   HGBA1C 5.9 (H) 09/01/2019   Lab Results  Component Value Date   INSULIN 11.0 09/01/2019   INSULIN 8.9 05/01/2019   At risk for diabetes mellitus. Crystal Simmons is at higher than average risk for developing diabetes due to her obesity.   Assessment/Plan:   Vitamin D deficiency. Low Vitamin D level contributes to fatigue and are associated with obesity, breast, and colon cancer. She will increase her OTC Vitamin D to 2,000 units daily  and will follow-up for routine testing of Vitamin D, at least 2-3 times per year to avoid over-replacement.  Prediabetes. Crystal Simmons will continue to work on weight loss, exercise, and decreasing simple carbohydrates to help decrease the risk of diabetes. She will continue metformin as directed.   At risk for diabetes mellitus. Crystal Simmons was given approximately 15 minutes of diabetes education and counseling today. We discussed intensive lifestyle modifications today with an emphasis on weight loss as well as increasing exercise and decreasing simple carbohydrates in her diet. We also reviewed medication options with an emphasis on risk versus benefit of those discussed.   Repetitive spaced learning was employed today to elicit superior memory formation and behavioral change.  Class 1 obesity with serious comorbidity and body mass index (BMI) of 30.0 to 30.9 in adult, unspecified obesity type.  Crystal Simmons is currently in the action stage of change. As such, her goal is to continue with weight loss efforts. She has agreed to keeping a food journal and adhering to recommended goals of 1300-1400 calories and 95 grams of protein daily.   Exercise goals: For substantial health benefits, adults should do at least 150 minutes (2 hours and 30 minutes) a week of moderate-intensity, or 75 minutes (1 hour and 15 minutes) a week of vigorous-intensity aerobic physical activity, or an equivalent combination of moderate- and vigorous-intensity aerobic activity. Aerobic activity should be performed in episodes of at least 10 minutes, and preferably, it should be spread throughout the week.  Behavioral modification strategies: increasing lean protein intake and planning  for success.  Crystal Simmons has agreed to follow-up with our clinic in 4 weeks. She was informed of the importance of frequent follow-up visits to maximize her success with intensive lifestyle modifications for her multiple health conditions.   Objective:   Blood  pressure 111/79, pulse 73, temperature 98 F (36.7 C), temperature source Oral, height 5\' 8"  (1.727 m), weight 202 lb (91.6 kg), last menstrual period 10/14/2005, SpO2 99 %. Body mass index is 30.71 kg/m.  General: Cooperative, alert, well developed, in no acute distress. HEENT: Conjunctivae and lids unremarkable. Cardiovascular: Regular rhythm.  Lungs: Normal work of breathing. Neurologic: No focal deficits.   Lab Results  Component Value Date   CREATININE 0.97 09/01/2019   BUN 13 09/01/2019   NA 141 09/01/2019   K 4.3 09/01/2019   CL 105 09/01/2019   CO2 24 09/01/2019   Lab Results  Component Value Date   ALT 12 09/01/2019   AST 16 09/01/2019   ALKPHOS 101 09/01/2019   BILITOT 0.6 09/01/2019   Lab Results  Component Value Date   HGBA1C 5.9 (H) 09/01/2019   HGBA1C 5.8 (H) 05/01/2019   HGBA1C 5.4 12/13/2015   Lab Results  Component Value Date   INSULIN 11.0 09/01/2019   INSULIN 8.9 05/01/2019   Lab Results  Component Value Date   TSH 1.990 05/01/2019   Lab Results  Component Value Date   CHOL 149 05/01/2019   HDL 60 05/01/2019   LDLCALC 76 05/01/2019   TRIG 63 05/01/2019   CHOLHDL 2.5 05/01/2019   Lab Results  Component Value Date   WBC 7.2 05/01/2019   HGB 13.0 05/01/2019   HCT 40.0 05/01/2019   MCV 95 05/01/2019   PLT 340 05/01/2019   Lab Results  Component Value Date   IRON 82 05/01/2019   TIBC 296 05/01/2019   FERRITIN 72 05/01/2019   Attestation Statements:   Reviewed by clinician on day of visit: allergies, medications, problem list, medical history, surgical history, family history, social history, and previous encounter notes.  IMichaelene Song, am acting as transcriptionist for Abby Potash, PA-C   I have reviewed the above documentation for accuracy and completeness, and I agree with the above. Abby Potash, PA-C

## 2019-10-14 ENCOUNTER — Other Ambulatory Visit (INDEPENDENT_AMBULATORY_CARE_PROVIDER_SITE_OTHER): Payer: Self-pay | Admitting: Physician Assistant

## 2019-10-14 DIAGNOSIS — G4700 Insomnia, unspecified: Secondary | ICD-10-CM

## 2019-10-14 DIAGNOSIS — R7303 Prediabetes: Secondary | ICD-10-CM

## 2019-10-14 MED ORDER — METFORMIN HCL 500 MG PO TABS: 500.0000 mg | ORAL_TABLET | Freq: Every day | ORAL | 0 refills | Status: DC

## 2019-10-14 MED ORDER — TRAZODONE HCL 50 MG PO TABS: 25.0000 mg | ORAL_TABLET | Freq: Every evening | ORAL | 0 refills | Status: DC | PRN

## 2019-10-14 NOTE — Telephone Encounter (Signed)
Refill sheet given to TA-CS 

## 2019-10-22 LAB — HM DIABETES EYE EXAM

## 2019-10-29 ENCOUNTER — Encounter: Payer: Self-pay | Admitting: Family Medicine

## 2019-10-30 ENCOUNTER — Encounter (INDEPENDENT_AMBULATORY_CARE_PROVIDER_SITE_OTHER): Payer: Self-pay | Admitting: Physician Assistant

## 2019-11-03 ENCOUNTER — Ambulatory Visit (INDEPENDENT_AMBULATORY_CARE_PROVIDER_SITE_OTHER): Payer: BC Managed Care – PPO | Admitting: Physician Assistant

## 2019-11-05 ENCOUNTER — Other Ambulatory Visit (INDEPENDENT_AMBULATORY_CARE_PROVIDER_SITE_OTHER): Payer: Self-pay | Admitting: Physician Assistant

## 2019-11-05 DIAGNOSIS — K5909 Other constipation: Secondary | ICD-10-CM

## 2019-11-05 DIAGNOSIS — H33312 Horseshoe tear of retina without detachment, left eye: Secondary | ICD-10-CM | POA: Diagnosis not present

## 2019-11-10 DIAGNOSIS — K5900 Constipation, unspecified: Secondary | ICD-10-CM | POA: Diagnosis not present

## 2019-11-21 ENCOUNTER — Other Ambulatory Visit (INDEPENDENT_AMBULATORY_CARE_PROVIDER_SITE_OTHER): Payer: Self-pay | Admitting: Physician Assistant

## 2019-11-21 DIAGNOSIS — R7303 Prediabetes: Secondary | ICD-10-CM

## 2019-11-23 ENCOUNTER — Other Ambulatory Visit (INDEPENDENT_AMBULATORY_CARE_PROVIDER_SITE_OTHER): Payer: Self-pay | Admitting: Physician Assistant

## 2019-11-23 DIAGNOSIS — R7303 Prediabetes: Secondary | ICD-10-CM

## 2019-11-26 ENCOUNTER — Ambulatory Visit: Payer: BC Managed Care – PPO | Admitting: Podiatry

## 2019-11-26 ENCOUNTER — Other Ambulatory Visit: Payer: Self-pay | Admitting: Podiatry

## 2019-11-26 ENCOUNTER — Other Ambulatory Visit: Payer: Self-pay

## 2019-11-26 ENCOUNTER — Ambulatory Visit (INDEPENDENT_AMBULATORY_CARE_PROVIDER_SITE_OTHER): Payer: BC Managed Care – PPO

## 2019-11-26 DIAGNOSIS — M722 Plantar fascial fibromatosis: Secondary | ICD-10-CM

## 2019-11-26 DIAGNOSIS — S9031XA Contusion of right foot, initial encounter: Secondary | ICD-10-CM

## 2019-11-27 ENCOUNTER — Encounter (INDEPENDENT_AMBULATORY_CARE_PROVIDER_SITE_OTHER): Payer: Self-pay | Admitting: Adult Health

## 2019-11-27 ENCOUNTER — Ambulatory Visit (INDEPENDENT_AMBULATORY_CARE_PROVIDER_SITE_OTHER): Payer: BC Managed Care – PPO | Admitting: Adult Health

## 2019-11-27 ENCOUNTER — Encounter: Payer: Self-pay | Admitting: Podiatry

## 2019-11-27 VITALS — BP 105/74 | HR 83 | Temp 98.5°F | Ht 68.0 in | Wt 200.0 lb

## 2019-11-27 DIAGNOSIS — E669 Obesity, unspecified: Secondary | ICD-10-CM | POA: Insufficient documentation

## 2019-11-27 DIAGNOSIS — G4700 Insomnia, unspecified: Secondary | ICD-10-CM | POA: Diagnosis not present

## 2019-11-27 DIAGNOSIS — Z9189 Other specified personal risk factors, not elsewhere classified: Secondary | ICD-10-CM | POA: Diagnosis not present

## 2019-11-27 DIAGNOSIS — R7303 Prediabetes: Secondary | ICD-10-CM | POA: Diagnosis not present

## 2019-11-27 DIAGNOSIS — Z6830 Body mass index (BMI) 30.0-30.9, adult: Secondary | ICD-10-CM

## 2019-11-27 MED ORDER — METFORMIN HCL 500 MG PO TABS: 500.0000 mg | ORAL_TABLET | Freq: Every day | ORAL | 0 refills | Status: DC

## 2019-11-27 NOTE — Progress Notes (Signed)
Subjective:  Patient ID: Crystal Simmons, female    DOB: 02/08/1961,  MRN: [id number]  Chief Complaint  Patient presents with  . Foot Pain    Right foot pain in the middle of foot and swelling under the right big toe.     59 y.o. female presents with the above complaint.  Patient presents with complaint with the right plantar midfoot pain.  Patient states been going on for quite some time and hurts when walking on it.  Worse with first thing in the morning when taking the step.  She does not have any heel pain but is more in the midfoot.  She states that she is on her feet pretty good amount of time.  She has not seen anyone else prior to seeing me.  She would like to discuss treatment options.  Her pain is sharp shooting in nature.  It is 7 out of 10.   Review of Systems: Negative except as noted in the HPI. Denies N/V/F/Ch.  Past Medical History:  Diagnosis Date  . Arthritis   . Back pain   . 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 Constipation   . Endometriosis   . Fibroids 10/95  . GERD (gastroesophageal reflux disease)   . 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 Insomnia   . Joint pain   . Lactose intolerance   . Menopausal state 2007  . Seasonal allergies   . Shingles 04/2018  . STD (sexually transmitted disease) 8/09   chlamydia  . Vitamin D deficiency disease 2010    Current Outpatient Medications:  .  Ascorbic Acid (VITAMIN C PO), Take by mouth., Disp: , Rfl:  .  cetirizine (ZYRTEC) 10 MG tablet, Take 10 mg by mouth daily., Disp: , Rfl:  .  Cholecalciferol (VITAMIN D PO), Take 2,000 Units by mouth daily. , Disp: , Rfl:  .  Cholecalciferol (VITAMIN D3 PO), Take by mouth., Disp: , Rfl:  .  ELDERBERRY PO, Take by mouth. + zinc, Disp: , Rfl:  .  estradiol (ESTRACE VAGINAL) 0.1 MG/GM vaginal cream, Use 1/2 gm vaginal cream only twice weekly in vagina., Disp: 42.5 g, Rfl: 3 .  fish oil-omega-3 fatty acids 1000 MG capsule, Take 2 g by mouth daily., Disp: , Rfl:  .   fluticasone (FLONASE) 50 MCG/ACT nasal spray, Place 2 sprays into the nose daily., Disp: , Rfl:  .  metFORMIN (GLUCOPHAGE) 500 MG tablet, Take 1 tablet (500 mg total) by mouth daily., Disp: 30 tablet, Rfl: 0 .  montelukast (SINGULAIR) 10 MG tablet, Take 1 tablet (10 mg total) by mouth at bedtime., Disp: 30 tablet, Rfl: 0 .  Multiple Vitamins-Minerals (MULTIVITAMIN WITH MINERALS) tablet, Take 1 tablet by mouth daily., Disp: , Rfl:  .  polyethylene glycol (MIRALAX / GLYCOLAX) 17 g packet, Take 17 g by mouth daily., Disp: 14 each, Rfl: 0 .  traZODone (DESYREL) 50 MG tablet, Take 0.5-1 tablets (25-50 mg total) by mouth at bedtime as needed for sleep., Disp: 30 tablet, Rfl: 0  Social History   Tobacco Use  Smoking Status Never Smoker  Smokeless Tobacco Never Used    Allergies  Allergen Reactions  . Sulfa Antibiotics Nausea And Vomiting  . Codeine Rash   Objective:  There were no vitals filed for this visit. There is no height or weight on file to calculate BMI. Constitutional Well developed. Well nourished.  Vascular Dorsalis pedis pulses palpable bilaterally. Posterior tibial pulses palpable bilaterally. Capillary refill normal  to all digits.  No cyanosis or clubbing noted. Pedal hair growth normal.  Neurologic Normal speech. Oriented to person, place, and time. Epicritic sensation to light touch grossly present bilaterally.  Dermatologic Nails well groomed and normal in appearance. No open wounds. No skin lesions.  Orthopedic: Normal joint ROM without pain or crepitus bilaterally. No visible deformities. Tender to palpation at the central band of the plantar fascia in the midfoot right side No pain with calcaneal squeeze right. Ankle ROM diminished range of motion right. Silfverskiold Test: positive right.   Radiographs: Taken and reviewed. No acute fractures or dislocations. No evidence of stress fracture.  Plantar heel spur present. Posterior heel spur present.  Previous  hardware noted appears to be intact without any signs of backing out or loosening  Assessment:   1. Plantar fasciitis of right foot    Plan:  Patient was evaluated and treated and all questions answered.  Plantar Fasciitis, right central band midfoot - XR reviewed as above.  - Educated on icing and stretching. Instructions given.  - Injection delivered to the plantar fascia as below. - DME: Plantar Fascial Brace - Pharmacologic management: None  Procedure: Injection Tendon/Ligament Location: Right plantar fascia at the point of maximal tenderness in the central band midfoot; medial approach. Skin Prep: alcohol Injectate: 0.5 cc 0.5% marcaine plain, 0.5 cc of 1% Lidocaine, 0.5 cc kenalog 10. Disposition: Patient tolerated procedure well. Injection site dressed with a band-aid.  No follow-ups on file.

## 2019-11-27 NOTE — Progress Notes (Signed)
Chief Complaint:   OBESITY Crystal Simmons is here to discuss her progress with her obesity treatment plan along with follow-up of her obesity related diagnoses. Crystal Simmons is keeping a food journal and adhering to recommended goals of 1000-1200 calories and 85 grams of protein and states she is following her eating plan approximately 90% of the time. Crystal Simmons states she is doing cardio 60 minutes 5 times per week.  Today's visit was #: 8 Starting weight: 211 lbs Starting date: 05/01/2019 Today's weight: 200 lbs Today's date: 11/27/2019 Total lbs lost to date: 11 Total lbs lost since last in-office visit: 2  Interim History: Crystal Simmons was using [employer] in January prior to starting [facility] and is very comfortable with the app. She focuses on lean protein and vegetables at each meal. She has been steadily increasing cardio. Her ultimate goal is to lose down to 180 lbs.  Subjective:   Prediabetes. Crystal Simmons has a diagnosis of prediabetes based on her elevated HgA1c and was informed this puts her at greater risk of developing diabetes. She continues to work on diet and exercise to decrease her risk of diabetes. She denies nausea or hypoglycemia. 09/01/2019 blood glucose 78, A1c 5.9 (up from 5.8 on 05/01/2019) with an insulin level of 11.0. Crystal Simmons is on metformin 500 mg at breakfast. CMP on 09/01/2019 showed a GFR of 74.  Lab Results  Component Value Date   HGBA1C 5.9 (H) 09/01/2019   Lab Results  Component Value Date   INSULIN 11.0 09/01/2019   INSULIN 8.9 05/01/2019   Insomnia, unspecified type. Crystal Simmons uses trazodone 50 mg 1/2 tab 1-2 times per week. She has intermittent difficulty remaining asleep.  At risk for diabetes mellitus. Crystal Simmons is at higher than average risk for developing diabetes due to prediabetes and obesity.   Assessment/Plan:   Prediabetes. Crystal Simmons will continue to work on weight loss, exercise, and decreasing simple carbohydrates to help decrease the  risk of diabetes. Refill was given for metFORMIN (GLUCOPHAGE) 500 MG tablet at breakfast #30 with 0 refills. Labs will be checked at her next office visit.  Insomnia, unspecified type. The problem of recurrent insomnia was discussed. Orders and follow up as documented in patient record. Counseling: Intensive lifestyle modifications are the first line treatment for this issue. We discussed several lifestyle modifications today and she will continue to work on diet, exercise and weight loss efforts. Crystal Simmons will continue regular exercise and PRN trazodone.  Counseling  Limit or avoid alcohol, caffeinated beverages, and cigarettes, especially close to bedtime.   Do not eat a large meal or eat spicy foods right before bedtime. This can lead to digestive discomfort that can make it hard for you to sleep.  Keep a sleep diary to help you and your health care provider figure out what could be causing your insomnia.   Make your bedroom a dark, comfortable place where it is easy to fall asleep. ? Put up shades or blackout curtains to block light from outside. ? Use a white noise machine to block noise. ? Keep the temperature cool.  Limit screen use before bedtime. This includes: ? Watching TV. ? Using your smartphone, tablet, or computer.  Stick to a routine that includes going to bed and waking up at the same times every day and night. This can help you fall asleep faster. Consider making a quiet activity, such as reading, part of your nighttime routine.  Try to avoid taking naps during the day so that  you sleep better at night.  Get out of bed if you are still awake after 15 minutes of trying to sleep. Keep the lights down, but try reading or doing a quiet activity. When you feel sleepy, go back to bed.  At risk for diabetes mellitus. Crystal Simmons was given approximately 15 minutes of diabetes education and counseling today. We discussed intensive lifestyle modifications today with an emphasis on weight  loss as well as increasing exercise and decreasing simple carbohydrates in her diet. We also reviewed medication options with an emphasis on risk versus benefit of those discussed.   Repetitive spaced learning was employed today to elicit superior memory formation and behavioral change.  Class 1 obesity with serious comorbidity and body mass index (BMI) of 30.0 to 30.9 in adult, unspecified obesity type.  Crystal Simmons is currently in the action stage of change. As such, her goal is to continue with weight loss efforts. She has agreed to keeping a food journal and adhering to recommended goals of 1000-1200 calories and 85 grams of protein daily.   Exercise goals: Crystal Simmons will continue cardio 60 minutes 5 times per week.  Behavioral modification strategies: increasing lean protein intake, meal planning and cooking strategies, planning for success and keeping a strict food journal.  Crystal Simmons has agreed to follow-up with our clinic fasting in 2 weeks. She was informed of the importance of frequent follow-up visits to maximize her success with intensive lifestyle modifications for her multiple health conditions.   Objective:   Blood pressure 105/74, pulse 83, temperature 98.5 F (36.9 C), height 5\' 8"  (1.727 m), weight 200 lb (90.7 kg), last menstrual period 10/14/2005, SpO2 99 %. Body mass index is 30.41 kg/m.  General: Cooperative, alert, well developed, in no acute distress. HEENT: Conjunctivae and lids unremarkable. Cardiovascular: Regular rhythm.  Lungs: Normal work of breathing. Neurologic: No focal deficits.   Lab Results  Component Value Date   CREATININE 0.97 09/01/2019   BUN 13 09/01/2019   NA 141 09/01/2019   K 4.3 09/01/2019   CL 105 09/01/2019   CO2 24 09/01/2019   Lab Results  Component Value Date   ALT 12 09/01/2019   AST 16 09/01/2019   ALKPHOS 101 09/01/2019   BILITOT 0.6 09/01/2019   Lab Results  Component Value Date   HGBA1C 5.9 (H) 09/01/2019   HGBA1C 5.8 (H)  05/01/2019   HGBA1C 5.4 12/13/2015   Lab Results  Component Value Date   INSULIN 11.0 09/01/2019   INSULIN 8.9 05/01/2019   Lab Results  Component Value Date   TSH 1.990 05/01/2019   Lab Results  Component Value Date   CHOL 149 05/01/2019   HDL 60 05/01/2019   LDLCALC 76 05/01/2019   TRIG 63 05/01/2019   CHOLHDL 2.5 05/01/2019   Lab Results  Component Value Date   WBC 7.2 05/01/2019   HGB 13.0 05/01/2019   HCT 40.0 05/01/2019   MCV 95 05/01/2019   PLT 340 05/01/2019   Lab Results  Component Value Date   IRON 82 05/01/2019   TIBC 296 05/01/2019   FERRITIN 72 05/01/2019   Attestation Statements:   Reviewed by clinician on day of visit: allergies, medications, problem list, medical history, surgical history, family history, social history, and previous encounter notes.  I, Michaelene Song, am acting as Location manager for [employer], NP-C   I have reviewed the above documentation for accuracy and completeness, and I agree with the above. -  Cortnie Ringel d. Jahmez Bily, NP-C

## 2019-12-11 ENCOUNTER — Encounter (INDEPENDENT_AMBULATORY_CARE_PROVIDER_SITE_OTHER): Payer: Self-pay | Admitting: Physician Assistant

## 2019-12-11 ENCOUNTER — Other Ambulatory Visit: Payer: Self-pay

## 2019-12-11 ENCOUNTER — Ambulatory Visit (INDEPENDENT_AMBULATORY_CARE_PROVIDER_SITE_OTHER): Payer: BC Managed Care – PPO | Admitting: Physician Assistant

## 2019-12-11 VITALS — BP 104/71 | HR 84 | Temp 98.2°F | Ht 68.0 in | Wt 200.0 lb

## 2019-12-11 DIAGNOSIS — Z9189 Other specified personal risk factors, not elsewhere classified: Secondary | ICD-10-CM

## 2019-12-11 DIAGNOSIS — R7303 Prediabetes: Secondary | ICD-10-CM | POA: Diagnosis not present

## 2019-12-11 DIAGNOSIS — Z6830 Body mass index (BMI) 30.0-30.9, adult: Secondary | ICD-10-CM | POA: Diagnosis not present

## 2019-12-11 DIAGNOSIS — E669 Obesity, unspecified: Secondary | ICD-10-CM | POA: Diagnosis not present

## 2019-12-11 DIAGNOSIS — E559 Vitamin D deficiency, unspecified: Secondary | ICD-10-CM | POA: Diagnosis not present

## 2019-12-11 MED ORDER — METFORMIN HCL 500 MG PO TABS: 500.0000 mg | ORAL_TABLET | Freq: Every day | ORAL | 0 refills | Status: DC

## 2019-12-11 NOTE — Progress Notes (Signed)
Chief Complaint:   OBESITY Crystal Simmons is here to discuss her progress with her obesity treatment plan along with follow-up of her obesity related diagnoses. [name] is keeping a food journal and adhering to recommended goals of 1200-1500 calories and 85 grams of protein and states she is following her eating plan approximately 90% of the time. [name] states she is walking/elliptical/aerobics 45 minutes 5 times per week.  Today's visit was #: 9 Starting weight: 211 lbs Starting date: 05/01/2019 Today's weight: 200 lbs Today's date: 12/11/2019 Total lbs lost to date: 11 Total lbs lost since last in-office visit: 0  Interim History: [name] is averaging between 65-75 grams of protein daily, but sometimes will reach her goal of 85 grams. She is staying between 1200-1500 calories daily, but is trying to stay around 1200 most days. She denies excessive hunger. She reports not sleeping well.  Subjective:   Prediabetes. [name] has a diagnosis of prediabetes based on her elevated HgA1c and was informed this puts her at greater risk of developing diabetes. She continues to work on diet and exercise to decrease her risk of diabetes. She denies nausea or hypoglycemia. [name] denies polyphagia. She is on metformin once daily without side effects.  Lab Results  Component Value Date   HGBA1C 5.9 (H) 09/01/2019   Lab Results  Component Value Date   INSULIN 11.0 09/01/2019   INSULIN 8.9 05/01/2019   Vitamin D deficiency. No nausea, vomiting, or muscle weakness on Vitamin D. Last level was not at goal. She is due for labs.   Ref. Range 09/01/2019 14:31  Vitamin D, 25-Hydroxy Latest Ref Range: 30.0 - 100.0 ng/mL 41.6   At risk for heart disease. [name] is at a higher than average risk for cardiovascular disease due to obesity.   Assessment/Plan:   Prediabetes. Keirra will continue to work on weight loss, exercise, and decreasing simple carbohydrates to help decrease the risk of diabetes.  Refill was given for metFORMIN (GLUCOPHAGE) 500 MG tablet #30 with 0 refills.  Vitamin D deficiency. Low Vitamin D level contributes to fatigue and are associated with obesity, breast, and colon cancer. She agrees to continue to take Vitamin D as directed and VITAMIN D 25 Hydroxy (Vit-D Deficiency, Fractures) level will be checked today.   At risk for heart disease. [name] was given approximately 15 minutes of coronary artery disease prevention counseling today. She is 59 y.o. female and has risk factors for heart disease including obesity. We discussed intensive lifestyle modifications today with an emphasis on specific weight loss instructions and strategies.   Repetitive spaced learning was employed today to elicit superior memory formation and behavioral change.  Class 1 obesity with serious comorbidity and body mass index (BMI) of 30.0 to 30.9 in adult, unspecified obesity type.  [name] is currently in the action stage of change. As such, her goal is to continue with weight loss efforts. She has agreed to keeping a food journal and adhering to recommended goals of 1200-1400 calories and 85 grams of protein daily.   Exercise goals: For substantial health benefits, adults should do at least 150 minutes (2 hours and 30 minutes) a week of moderate-intensity, or 75 minutes (1 hour and 15 minutes) a week of vigorous-intensity aerobic physical activity, or an equivalent combination of moderate- and vigorous-intensity aerobic activity. Aerobic activity should be performed in episodes of at least 10 minutes, and preferably, it should be spread throughout the week.  Behavioral modification strategies: increasing lean protein intake and meal planning  and cooking strategies.  [name] has agreed to follow-up with our clinic in 4 weeks. She was informed of the importance of frequent follow-up visits to maximize her success with intensive lifestyle modifications for her multiple health conditions.   [name]  was informed we would discuss her lab results at her next visit unless there is a critical issue that needs to be addressed sooner. [name] agreed to keep her next visit at the agreed upon time to discuss these results.  Objective:   Blood pressure 104/71, pulse 84, temperature 98.2 F (36.8 C), temperature source Oral, height 5\' 8"  (1.727 m), weight 200 lb (90.7 kg), last menstrual period 10/14/2005, SpO2 96 %. Body mass index is 30.41 kg/m.  General: Cooperative, alert, well developed, in no acute distress. HEENT: Conjunctivae and lids unremarkable. Cardiovascular: Regular rhythm.  Lungs: Normal work of breathing. Neurologic: No focal deficits.   Lab Results  Component Value Date   CREATININE 0.97 09/01/2019   BUN 13 09/01/2019   NA 141 09/01/2019   K 4.3 09/01/2019   CL 105 09/01/2019   CO2 24 09/01/2019   Lab Results  Component Value Date   ALT 12 09/01/2019   AST 16 09/01/2019   ALKPHOS 101 09/01/2019   BILITOT 0.6 09/01/2019   Lab Results  Component Value Date   HGBA1C 5.9 (H) 09/01/2019   HGBA1C 5.8 (H) 05/01/2019   HGBA1C 5.4 12/13/2015   Lab Results  Component Value Date   INSULIN 11.0 09/01/2019   INSULIN 8.9 05/01/2019   Lab Results  Component Value Date   TSH 1.990 05/01/2019   Lab Results  Component Value Date   CHOL 149 05/01/2019   HDL 60 05/01/2019   LDLCALC 76 05/01/2019   TRIG 63 05/01/2019   CHOLHDL 2.5 05/01/2019   Lab Results  Component Value Date   WBC 7.2 05/01/2019   HGB 13.0 05/01/2019   HCT 40.0 05/01/2019   MCV 95 05/01/2019   PLT 340 05/01/2019   Lab Results  Component Value Date   IRON 82 05/01/2019   TIBC 296 05/01/2019   FERRITIN 72 05/01/2019   Attestation Statements:   Reviewed by clinician on day of visit: allergies, medications, problem list, medical history, surgical history, family history, social history, and previous encounter notes.  IMichaelene Song, am acting as transcriptionist for Abby Potash, PA-C    I have reviewed the above documentation for accuracy and completeness, and I agree with the above. Abby Potash, PA-C

## 2019-12-12 ENCOUNTER — Encounter (INDEPENDENT_AMBULATORY_CARE_PROVIDER_SITE_OTHER): Payer: Self-pay | Admitting: Physician Assistant

## 2019-12-12 LAB — COMPREHENSIVE METABOLIC PANEL
ALT: 12 IU/L (ref 0–32)
AST: 15 IU/L (ref 0–40)
Albumin/Globulin Ratio: 1.4 (ref 1.2–2.2)
Albumin: 4.2 g/dL (ref 3.8–4.9)
Alkaline Phosphatase: 95 IU/L (ref 44–121)
BUN/Creatinine Ratio: 13 (ref 9–23)
BUN: 11 mg/dL (ref 6–24)
Bilirubin Total: 0.7 mg/dL (ref 0.0–1.2)
CO2: 26 mmol/L (ref 20–29)
Calcium: 9.5 mg/dL (ref 8.7–10.2)
Chloride: 104 mmol/L (ref 96–106)
Creatinine, Ser: 0.87 mg/dL (ref 0.57–1.00)
GFR calc Af Amer: 84 mL/min/{1.73_m2} (ref >59)
GFR calc non Af Amer: 73 mL/min/{1.73_m2} (ref >59)
Globulin, Total: 2.9 g/dL (ref 1.5–4.5)
Glucose: 84 mg/dL (ref 65–99)
Potassium: 4.5 mmol/L (ref 3.5–5.2)
Sodium: 143 mmol/L (ref 134–144)
Total Protein: 7.1 g/dL (ref 6.0–8.5)

## 2019-12-12 LAB — VITAMIN D 25 HYDROXY (VIT D DEFICIENCY, FRACTURES): Vit D, 25-Hydroxy: 78.8 ng/mL (ref 30.0–100.0)

## 2019-12-12 LAB — HEMOGLOBIN A1C
Est. average glucose Bld gHb Est-mCnc: 120 mg/dL
Hgb A1c MFr Bld: 5.8 % — ABNORMAL HIGH (ref 4.8–5.6)

## 2019-12-12 LAB — INSULIN, RANDOM: INSULIN: 5.1 u[IU]/mL (ref 2.6–24.9)

## 2019-12-15 NOTE — Telephone Encounter (Signed)
Please advise 

## 2019-12-26 ENCOUNTER — Other Ambulatory Visit: Payer: Self-pay

## 2019-12-26 ENCOUNTER — Ambulatory Visit: Payer: BC Managed Care – PPO | Admitting: Podiatry

## 2019-12-26 DIAGNOSIS — M722 Plantar fascial fibromatosis: Secondary | ICD-10-CM | POA: Diagnosis not present

## 2019-12-26 DIAGNOSIS — M25871 Other specified joint disorders, right ankle and foot: Secondary | ICD-10-CM

## 2019-12-30 ENCOUNTER — Encounter: Payer: Self-pay | Admitting: Podiatry

## 2019-12-30 NOTE — Progress Notes (Signed)
Subjective:  Patient ID: Crystal Simmons, female    DOB: 03/26/1960,  MRN: [id number]  Chief Complaint  Patient presents with  . Foot Pain    right foot pain. PT stated that nothing is really any different. she is still having some swelling     59 y.o. female presents with the above complaint.  Patient presents with a follow-up of plantar fasciitis to the right foot.  She states she is 100% resolved with a steroid injection.  She has been wearing her brace which has helped considerably.  She has a secondary complaint of submetatarsal 1 pain that has been going on for quite some time.  She states the pain kind of has unmasked herself after me giving a steroid injection for the plantar fasciitis.  I believe this may have been the all driving force behind the pain at the plantar fascia as well.  She denies any other acute complaints.  She would like to discuss treatment options   Review of Systems: Negative except as noted in the HPI. Denies N/V/F/Ch.  Past Medical History:  Diagnosis Date  . Arthritis   . Back pain   . 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 Constipation   . Endometriosis   . Fibroids 10/95  . GERD (gastroesophageal reflux disease)   . 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 Insomnia   . Joint pain   . Lactose intolerance   . Menopausal state 2007  . Seasonal allergies   . Shingles 04/2018  . STD (sexually transmitted disease) 8/09   chlamydia  . Vitamin D deficiency disease 2010    Current Outpatient Medications:  .  Ascorbic Acid (VITAMIN C PO), Take by mouth., Disp: , Rfl:  .  cetirizine (ZYRTEC) 10 MG tablet, Take 10 mg by mouth daily., Disp: , Rfl:  .  Cholecalciferol (VITAMIN D PO), Take 2,000 Units by mouth daily. , Disp: , Rfl:  .  Cholecalciferol (VITAMIN D3 PO), Take by mouth., Disp: , Rfl:  .  ELDERBERRY PO, Take by mouth. + zinc, Disp: , Rfl:  .  estradiol (ESTRACE VAGINAL) 0.1 MG/GM vaginal cream, Use 1/2 gm vaginal cream only twice weekly in  vagina., Disp: 42.5 g, Rfl: 3 .  fish oil-omega-3 fatty acids 1000 MG capsule, Take 2 g by mouth daily., Disp: , Rfl:  .  fluticasone (FLONASE) 50 MCG/ACT nasal spray, Place 2 sprays into the nose daily., Disp: , Rfl:  .  metFORMIN (GLUCOPHAGE) 500 MG tablet, Take 1 tablet (500 mg total) by mouth daily., Disp: 30 tablet, Rfl: 0 .  Multiple Vitamins-Minerals (MULTIVITAMIN WITH MINERALS) tablet, Take 1 tablet by mouth daily., Disp: , Rfl:  .  polyethylene glycol (MIRALAX / GLYCOLAX) 17 g packet, Take 17 g by mouth daily., Disp: 14 each, Rfl: 0 .  traZODone (DESYREL) 50 MG tablet, Take 0.5-1 tablets (25-50 mg total) by mouth at bedtime as needed for sleep., Disp: 30 tablet, Rfl: 0  Social History   Tobacco Use  Smoking Status Never Smoker  Smokeless Tobacco Never Used    Allergies  Allergen Reactions  . Sulfa Antibiotics Nausea And Vomiting  . Codeine Rash   Objective:  There were no vitals filed for this visit. There is no height or weight on file to calculate BMI. Constitutional Well developed. Well nourished.  Vascular Dorsalis pedis pulses palpable bilaterally. Posterior tibial pulses palpable bilaterally. Capillary refill normal to all digits.  No cyanosis or clubbing noted. Pedal hair growth  normal.  Neurologic Normal speech. Oriented to person, place, and time. Epicritic sensation to light touch grossly present bilaterally.  Dermatologic Nails well groomed and normal in appearance. No open wounds. No skin lesions.  Orthopedic: Normal joint ROM without pain or crepitus bilaterally. No visible deformities. No tender to palpation at the central band of the plantar fascia in the midfoot right side No pain with calcaneal squeeze right. Ankle ROM diminished range of motion right. Silfverskiold Test: positive right.  Pain on palpation submetatarsal 1 at the sesamoidal complex.  No pain with range of motion of the MPJ.  No intra-articular MPJ pain noted.  No flexor or extensor  tendinitis appreciated.   Radiographs: Taken and reviewed. No acute fractures or dislocations. No evidence of stress fracture.  Plantar heel spur present. Posterior heel spur present.  Previous hardware noted appears to be intact without any signs of backing out or loosening  Assessment:   1. Sesamoiditis of right foot   2. Plantar fasciitis of right foot    Plan:  Patient was evaluated and treated and all questions answered.  Plantar Fasciitis, right central band midfoot - Clinically resolved with steroid injection.  Continue wearing plantar fascial braces.  Right sesamoiditis -I explained to the patient the etiology of sesamoiditis and various treatment options were discussed.  Given the amount of pain that she is having I believe she will benefit from offloading the submetatarsal 1 had with the sesamoidal complex with a dancers pad.  Dancers pads were dispensed and have instructed her how to place it in her shoes.  Patient states understanding  Pes planovalgus -I explained to the patient the etiology of pes planovalgus and various treatment options were discussed.  Nonweightbearing gait examination shows the patient has a plantarflexed first metatarsal leading to excessive pressure to the sesamoidal complex and therefore submetatarsal 1 pain.  I believe patient will benefit from custom-made orthotics to help control the hindfoot motion and support the arch of the foot and take the stress off of the sesamoiditis with incorporation of dancers pad.  Should be scheduled see rec for custom-made orthotics  No follow-ups on file.

## 2020-01-07 IMAGING — MG 2D DIGITAL SCREENING BILATERAL MAMMOGRAM WITH 3D TOMO WITH CAD
8 of 12 series · 8 of 28 positions shown · non-contrast
Comparison: Previous exam(s).

CLINICAL DATA: Screening.

EXAM:
2D DIGITAL SCREENING BILATERAL MAMMOGRAM WITH 3D TOMO WITH CAD

[R CC]
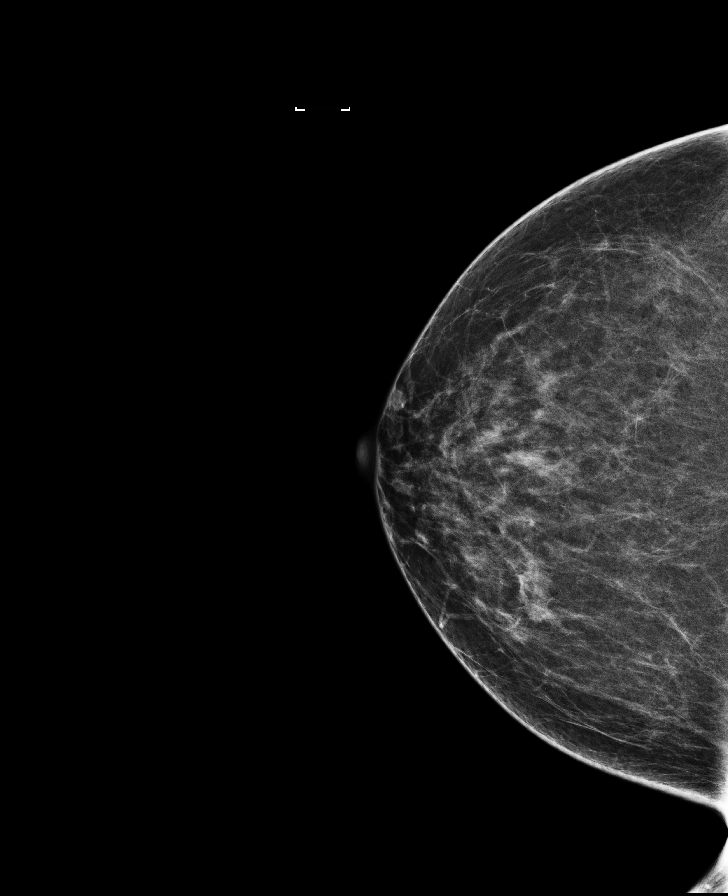

[L CC synth-2D]
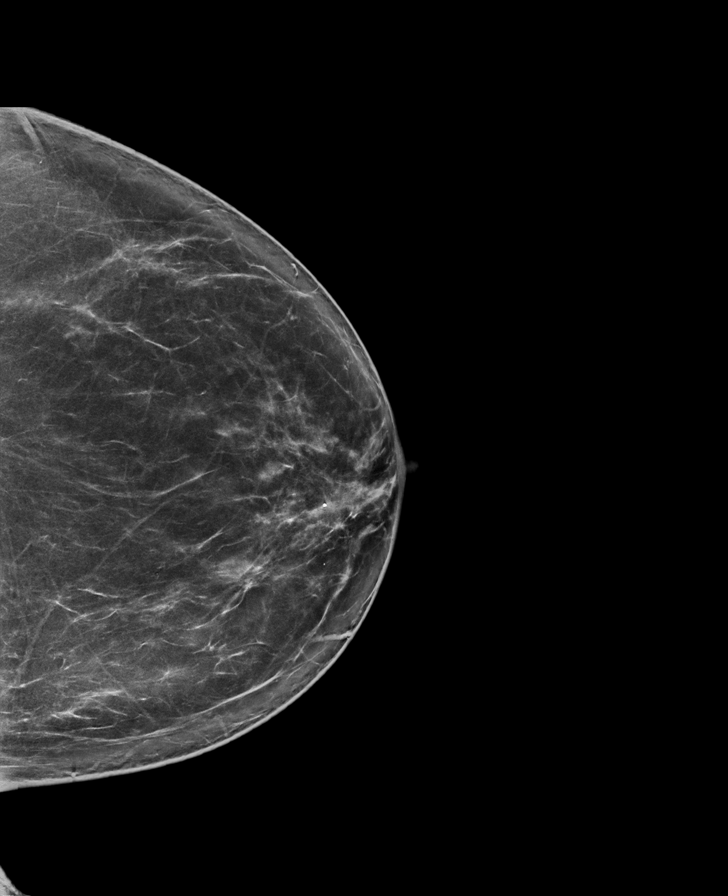

[R MLO]
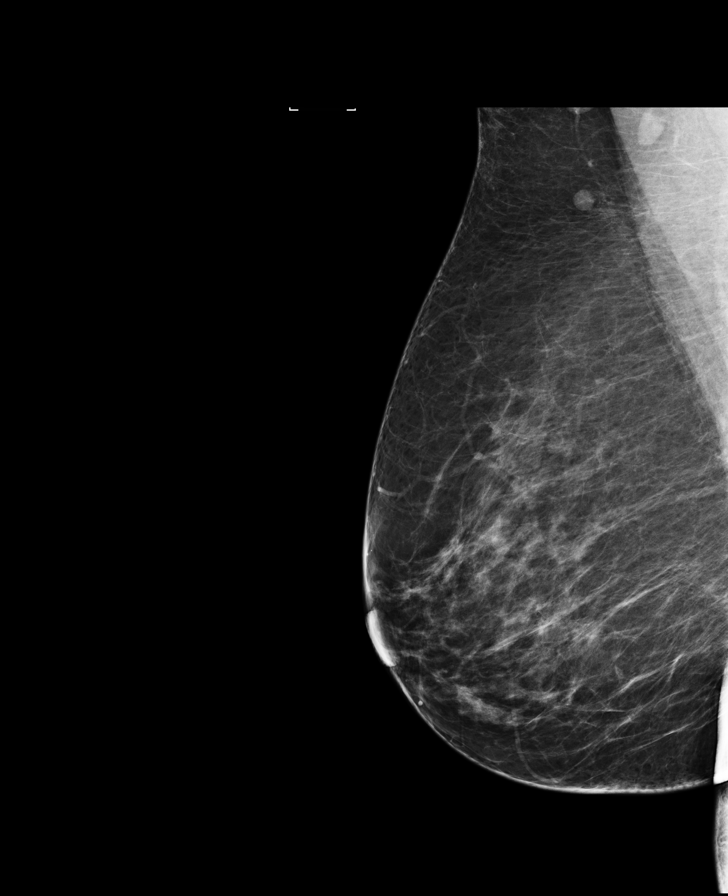

[L CC]
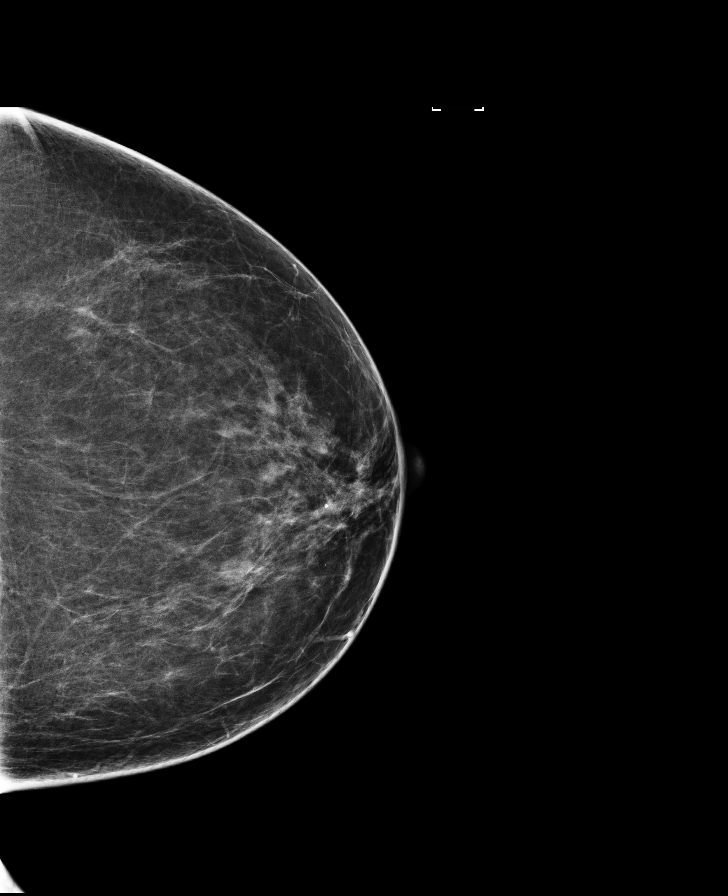

[L MLO synth-2D]
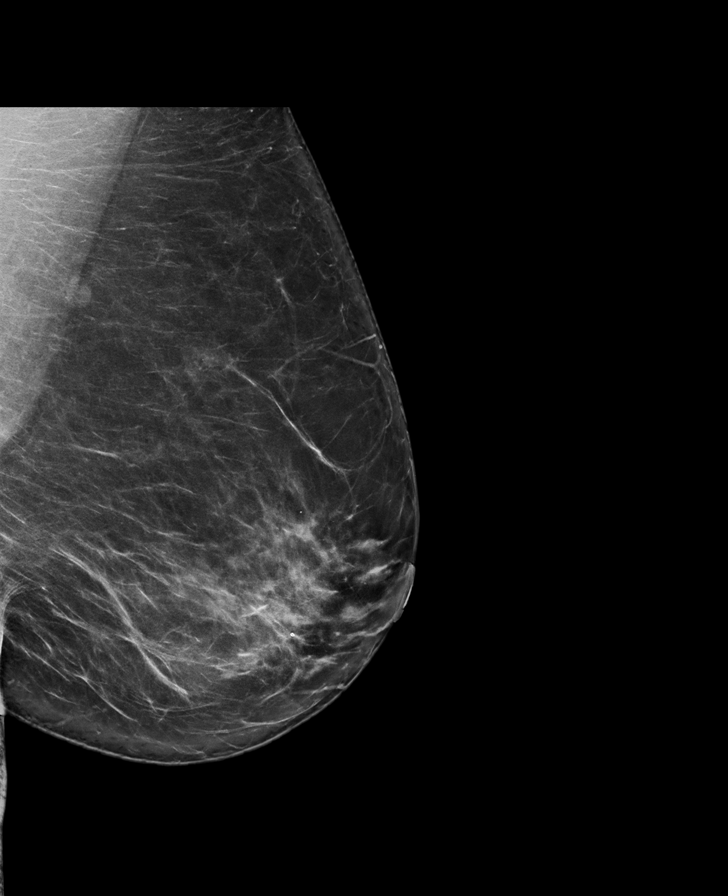

[R CC synth-2D]
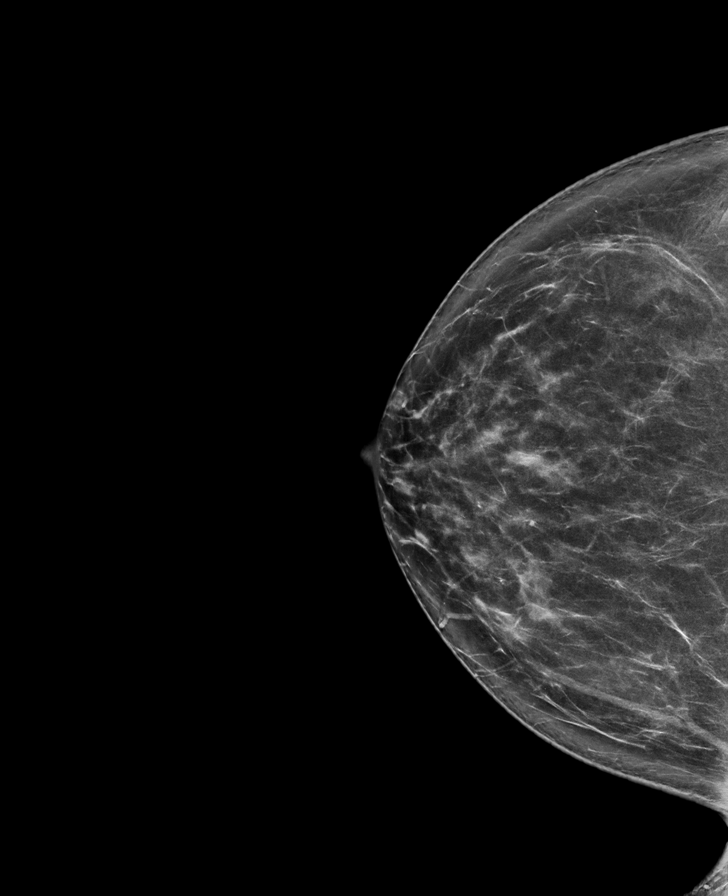

[L MLO]
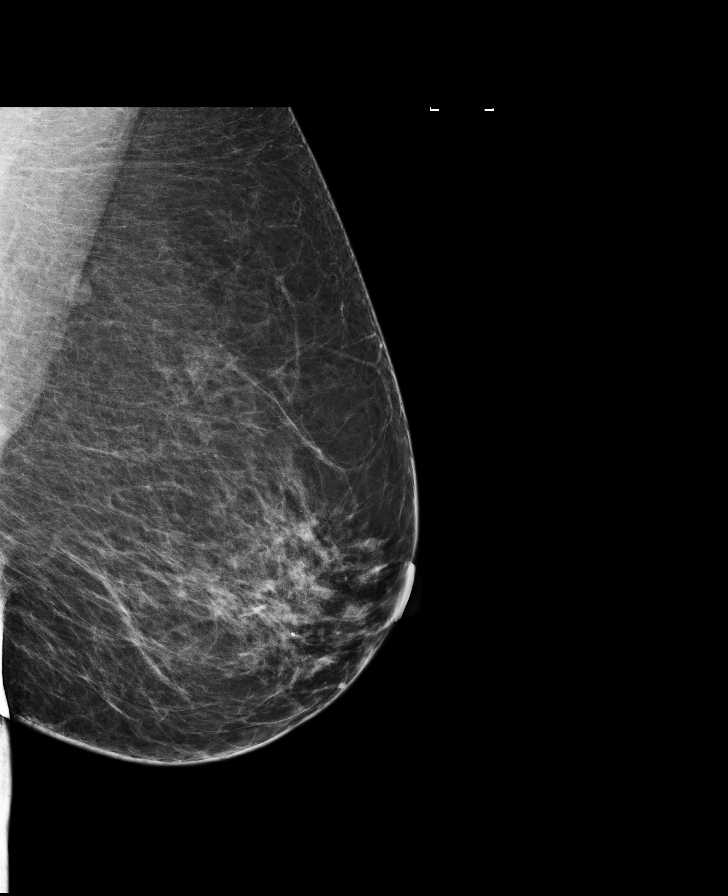

[R MLO synth-2D]
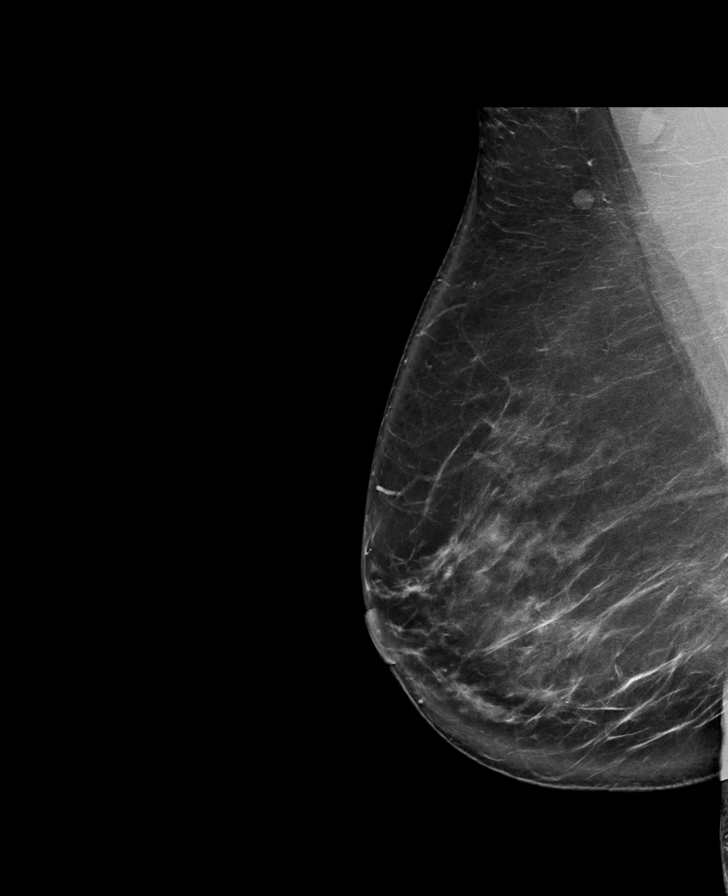

[8 of 28 positions shown; findings below may reference images not displayed]

ACR Breast Density Category b: There are scattered areas of
fibroglandular density.
FINDINGS: There are no findings suspicious for malignancy. Images were
processed with CAD.
IMPRESSION: No mammographic evidence of malignancy. A result letter of this
screening mammogram will be mailed directly to the patient.

RECOMMENDATION:
Screening mammogram in one year. (Code:[id number])

BI-RADS CATEGORY  1: Negative.

## 2020-01-12 ENCOUNTER — Ambulatory Visit (INDEPENDENT_AMBULATORY_CARE_PROVIDER_SITE_OTHER): Payer: BC Managed Care – PPO | Admitting: Physician Assistant

## 2020-01-13 ENCOUNTER — Ambulatory Visit (INDEPENDENT_AMBULATORY_CARE_PROVIDER_SITE_OTHER): Payer: BC Managed Care – PPO | Admitting: Adult Health

## 2020-01-16 ENCOUNTER — Encounter: Payer: Self-pay | Admitting: Podiatry

## 2020-01-19 ENCOUNTER — Other Ambulatory Visit: Payer: BC Managed Care – PPO | Admitting: Orthotics

## 2020-01-20 ENCOUNTER — Ambulatory Visit (INDEPENDENT_AMBULATORY_CARE_PROVIDER_SITE_OTHER): Payer: BC Managed Care – PPO | Admitting: Family Medicine

## 2020-01-20 ENCOUNTER — Other Ambulatory Visit (INDEPENDENT_AMBULATORY_CARE_PROVIDER_SITE_OTHER): Payer: Self-pay | Admitting: Physician Assistant

## 2020-01-20 DIAGNOSIS — R7303 Prediabetes: Secondary | ICD-10-CM

## 2020-01-20 NOTE — Telephone Encounter (Signed)
This patient was last seen by Abby Potash, PA-C, and currently has an upcoming appt scheduled on 01/28/20 with Dr. Juleen China.

## 2020-01-20 NOTE — Telephone Encounter (Signed)
Patient called to say she has enough metformin to last until her next visit

## 2020-01-20 NOTE — Telephone Encounter (Signed)
Called pt and she said that she will call back within the hour if there is she will run out before her next appointment.

## 2020-01-20 NOTE — Telephone Encounter (Signed)
Last seen by Tracey 

## 2020-01-23 NOTE — Progress Notes (Signed)
59 y.o. [place] female here for annual exam.      Has lost 20 lbs since last annual, working out 45 min per day, 10,000 step. Using [employer]  Using Estrace Cream, wants something more convenient Would like std screening today  Leaving for vacation to [place] next week Patient's last menstrual period was 10/14/2005 (approximate).          Sexually active: Yes.    The current method of family planning is post menopausal status.    Exercising: Yes.    5 times a week Smoker:  no  Health Maintenance: Pap:  12-19-16 neg HPV HR neg, 01-21-2019 neg  History of abnormal Pap:  Yes, cryo MMG:  05-05-2019 category b density birads 1:neg Colonoscopy:  2017 f/u 54yrs per patient, polyps BMD:   none TDaP:  2014 Gardasil:   n/a Covid-19: Yes Pneumonia vaccine(s):  never Shingrix:   never Hep C testing: neg c neg 2020 Screening Labs: Healthy weight and wellness   reports that she has never smoked. She has never used smokeless tobacco. She reports current alcohol use. She reports that she does not use drugs.  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   Insomnia    Joint pain    Lactose intolerance    Menopausal state 2007   Seasonal allergies    Shingles 04/2018   STD (sexually transmitted disease) 8/09   chlamydia   Vitamin D deficiency disease 2010    Past Surgical History:  Procedure Laterality Date   BREAST SURGERY Left 6/03   Biopsy negative   CERVIX LESION DESTRUCTION  4/92   COLONOSCOPY W/ BIOPSIES  09/26/10   polyp benign   COLPOSCOPY  10/12   CIN I   KNEE SURGERY  12/2009   left for meniscal tear   LAPAROSCOPY  7/02   multiple surgeries, including LSO, LOA, and endometrioma, and fibroids secondary to  endometriosis   UMBILICAL EXPLORATION  5/57   for endometrioma     Current Outpatient Medications  Medication Sig Dispense Refill   Ascorbic Acid (VITAMIN C PO) Take by mouth.     cetirizine (ZYRTEC) 10 MG tablet Take 10 mg by mouth daily.     Cholecalciferol (VITAMIN D3) 50 MCG (2000 UT) TABS Take by mouth.     ELDERBERRY PO Take by mouth. + zinc     estradiol (ESTRACE VAGINAL) 0.1 MG/GM vaginal cream Use 1/2 gm vaginal cream only twice weekly in vagina. 42.5 g 3   fish oil-omega-3 fatty acids 1000 MG capsule Take 2 g by mouth daily.     fluticasone (FLONASE) 50 MCG/ACT nasal spray Place 2 sprays into the nose daily.     MAGNESIUM GLYCINATE PO Take 400 mg by mouth daily.     metFORMIN (GLUCOPHAGE) 500 MG tablet Take 1 tablet (500 mg total) by mouth daily. 30 tablet 0   Multiple Vitamins-Minerals (MULTIVITAMIN WITH MINERALS) tablet Take 1 tablet by mouth daily.     polyethylene glycol (MIRALAX / GLYCOLAX) 17 g packet Take 17 g by mouth daily. 14 each 0   PREBIOTIC PRODUCT PO Take by mouth.     traZODone (DESYREL) 50 MG tablet Take 0.5-1 tablets (25-50 mg total)  by mouth at bedtime as needed for sleep. 30 tablet 0   No current facility-administered medications for this visit.    Family History  Problem Relation Age of Onset   Hypertension Mother    Diabetes Father    Hypertension Father    Heart disease Father    Sudden death Father    Diabetes Brother    Diabetes Paternal Grandfather     Review of Systems  Constitutional: Negative.   HENT: Negative.   Eyes: Negative.   Respiratory: Negative.   Cardiovascular: Negative.   Gastrointestinal: Negative.   Endocrine: Negative.   Genitourinary: Negative.   Musculoskeletal: Negative.   Skin: Negative.   Allergic/Immunologic: Negative.   Neurological: Negative.   Hematological: Negative.   Psychiatric/Behavioral: Negative.     Exam:   BP 124/84    Pulse 74    Resp 16    Ht 5' 8.25" (1.734 m)    Wt 208  lb (94.3 kg)    LMP 10/14/2005 (Approximate)    BMI 31.40 kg/m   Height: 5' 8.25" (173.4 cm)  General appearance: alert, cooperative and appears stated age Head: Normocephalic, without obvious abnormality, atraumatic Neck: no adenopathy, supple, symmetrical, trachea midline and thyroid normal to inspection and palpation Lungs: clear to auscultation bilaterally Breasts: normal appearance, no masses or tenderness Heart: regular rate and rhythm Abdomen: soft, non-tender; bowel sounds normal; no masses,  no organomegaly, significant scar tissue lower abdomen Extremities: extremities normal, atraumatic, no cyanosis or edema Skin: Skin color, texture, turgor normal. No rashes or lesions Lymph nodes: Cervical, supraclavicular, and axillary nodes normal. No abnormal inguinal nodes palpated Neurologic: Grossly normal   Pelvic: External genitalia:  no lesions              Urethra:  normal appearing urethra with no masses, tenderness or lesions              Bartholins and Skenes: normal                 Vagina: normal appearing vagina with normal color and discharge, no lesions              Cervix: no cervical motion tenderness              Pap taken: No. Bimanual Exam:  Uterus:  normal size, contour, position, consistency, mobility, non-tender              Adnexa: no mass, fullness, tenderness               Rectovaginal: Confirms               Anus:  normal sphincter tone, no lesions  Lovena Le, CMA Chaperone was present for exam.  A:  Well Woman with normal exam  STD screening  Genitourinary syndrome of menopause (GSM)  P:   Mammogram, pt to call and schedule, due 04/2020  pap smear done last year WNL, due 2023  Colonoscopy due 2022  HIV, RPR, CT/GC/Trich testing done  D/C Estrace, star Estring vaginal ring (for convenience) return annually or prn

## 2020-01-26 ENCOUNTER — Ambulatory Visit: Payer: BC Managed Care – PPO | Admitting: Nurse Practitioner

## 2020-01-26 ENCOUNTER — Ambulatory Visit: Payer: BC Managed Care – PPO | Admitting: Obstetrics & Gynecology

## 2020-01-26 ENCOUNTER — Encounter: Payer: Self-pay | Admitting: Nurse Practitioner

## 2020-01-26 ENCOUNTER — Other Ambulatory Visit: Payer: Self-pay

## 2020-01-26 ENCOUNTER — Ambulatory Visit: Payer: BC Managed Care – PPO | Admitting: Certified Nurse Midwife

## 2020-01-26 VITALS — BP 124/84 | HR 74 | Resp 16 | Ht 68.25 in | Wt 208.0 lb

## 2020-01-26 DIAGNOSIS — Z01419 Encounter for gynecological examination (general) (routine) without abnormal findings: Secondary | ICD-10-CM | POA: Diagnosis not present

## 2020-01-26 DIAGNOSIS — Z113 Encounter for screening for infections with a predominantly sexual mode of transmission: Secondary | ICD-10-CM | POA: Diagnosis not present

## 2020-01-26 DIAGNOSIS — N952 Postmenopausal atrophic vaginitis: Secondary | ICD-10-CM

## 2020-01-26 MED ORDER — ESTRADIOL 2 MG VA RING: 2.0000 mg | VAGINAL_RING | VAGINAL | 4 refills | Status: DC

## 2020-01-26 NOTE — Patient Instructions (Signed)

## 2020-01-27 LAB — HIV ANTIBODY (ROUTINE TESTING W REFLEX): HIV Screen 4th Generation wRfx: NONREACTIVE

## 2020-01-27 LAB — CHLAMYDIA/GONOCOCCUS/TRICHOMONAS, NAA
Chlamydia by NAA: NEGATIVE
Gonococcus by NAA: NEGATIVE
Trich vag by NAA: NEGATIVE

## 2020-01-27 LAB — RPR: RPR Ser Ql: NONREACTIVE

## 2020-01-28 ENCOUNTER — Ambulatory Visit (INDEPENDENT_AMBULATORY_CARE_PROVIDER_SITE_OTHER): Payer: BC Managed Care – PPO | Admitting: Family Medicine

## 2020-01-28 ENCOUNTER — Other Ambulatory Visit: Payer: Self-pay

## 2020-01-28 VITALS — BP 117/78 | HR 80 | Temp 98.1°F | Ht 68.0 in | Wt 201.0 lb

## 2020-01-28 DIAGNOSIS — Z9189 Other specified personal risk factors, not elsewhere classified: Secondary | ICD-10-CM | POA: Diagnosis not present

## 2020-01-28 DIAGNOSIS — R7303 Prediabetes: Secondary | ICD-10-CM

## 2020-01-28 DIAGNOSIS — Z6830 Body mass index (BMI) 30.0-30.9, adult: Secondary | ICD-10-CM

## 2020-01-28 DIAGNOSIS — E669 Obesity, unspecified: Secondary | ICD-10-CM

## 2020-01-28 DIAGNOSIS — G4709 Other insomnia: Secondary | ICD-10-CM

## 2020-01-28 DIAGNOSIS — S96911A Strain of unspecified muscle and tendon at ankle and foot level, right foot, initial encounter: Secondary | ICD-10-CM

## 2020-01-28 DIAGNOSIS — R948 Abnormal results of function studies of other organs and systems: Secondary | ICD-10-CM

## 2020-01-28 MED ORDER — METFORMIN HCL 500 MG PO TABS: 500.0000 mg | ORAL_TABLET | Freq: Every day | ORAL | 0 refills | Status: DC

## 2020-01-28 MED ORDER — PHENTERMINE HCL 37.5 MG PO TABS: 18.6000 mg | ORAL_TABLET | Freq: Every day | ORAL | 0 refills | Status: DC

## 2020-01-28 MED ORDER — TRAZODONE HCL 50 MG PO TABS: 25.0000 mg | ORAL_TABLET | Freq: Every evening | ORAL | 0 refills | Status: DC | PRN

## 2020-02-02 NOTE — Progress Notes (Signed)
Chief Complaint:   OBESITY Crystal Simmons is here to discuss her progress with her obesity treatment plan along with follow-up of her obesity related diagnoses.   Today's visit was #: 10 Starting weight: 211 lbs Starting date: 05/01/2019 Today's weight: 201 lbs Today's date: 01/28/2020 Total lbs lost to date: 10 lbs Body mass index is 30.56 kg/m.  Total weight loss percentage to date: -4.74%  Interim History: Crystal Simmons says she is going to [place] next week.  She is getting 5 hours of sleep on a "good night". Nutrition Plan: keeping a food journal and adhering to recommended goals of 1200-1500 calories and 85 grams of protein for 90% of the time.  Anti-obesity medications: phentermine 18.6 mg daily, metformin 500 mg daily. Reported side effects: None. Activity: Cardio for 45-60 minutes 5 times per week. Sleep: Number of hours slept each night: 5.   Assessment/Plan:   1. Other insomnia This is moderately controlled.  Average hours of sleep per night: 5.   Plan: Recommend sleep hygiene measures including regular sleep schedule, optimal sleep environment, and relaxing presleep rituals. Medication: trazodone 25-50 mg at bedtime as needed.  - Refill traZODone (DESYREL) 50 MG tablet; Take 0.5-1 tablets (25-50 mg total) by mouth at bedtime as needed for sleep.  Dispense: 30 tablet; Refill: 0  2. Prediabetes Improving, but not optimized. Goal is HgbA1c < 5.7.  Medication: metformin 500 mg daily.  She will continue to focus on protein-rich, low simple carbohydrate foods. We reviewed the importance of hydration, regular exercise for stress reduction, and restorative sleep.   Lab Results  Component Value Date   HGBA1C 5.8 (H) 12/11/2019   Lab Results  Component Value Date   INSULIN 5.1 12/11/2019   INSULIN 11.0 09/01/2019   INSULIN 8.9 05/01/2019   - Refill metFORMIN (GLUCOPHAGE) 500 MG tablet; Take 1 tablet (500 mg total) by mouth daily.  Dispense: 30 tablet; Refill: 0  3. Strain of  right ankle, initial encounter New.  Reviewed treatment and red flags. We will continue to monitor symptoms as they relate to her weight loss journey.  4. Abnormal metabolism Crystal Simmons's metabolism is slower than it should be. After discussion, patient would like to start below medication. Expectations, risks, and potential side effects reviewed.   Plan:  Will start phentermine 18.6 mg daily. Risk versus benefits of medication reviewed. The patient understands monitoring parameters and red flags. I have consulted the [place] Controlled Substances Registry for this patient, and feel the risk/benefit ratio today is favorable for proceeding with this prescription for a controlled substance. The patient understands monitoring parameters and red flags.   - Start phentermine (ADIPEX-P) 37.5 MG tablet; Take 0.5 tablets (18.6 mg total) by mouth daily before breakfast.  Dispense: 15 tablet; Refill: 0  5. At risk for activity intolerance Crystal Simmons was given approximately 15 minutes of exercise intolerance counseling today. She is 59 y.o. female and has risk factors exercise intolerance including obesity and prediabetes. We discussed intensive lifestyle modifications today with an emphasis on specific weight loss instructions and strategies. Crystal Simmons will slowly increase activity as tolerated.Repetitive spaced learning was employed today to elicit superior memory formation and behavioral change.  During insulin resistance, several metabolic alterations induce the development of cardiovascular disease. For instance, insulin resistance can induce an imbalance in glucose metabolism that generates chronic hyperglycemia, which in turn triggers oxidative stress and causes an inflammatory response that leads to cell damage. Insulin resistance can also alter systemic lipid metabolism which then leads to the  development of dyslipidemia and the well-known lipid triad: (1) high levels of plasma triglycerides, (2) low levels of  high-density lipoprotein, and (3) the appearance of small dense low-density lipoproteins. This triad, along with endothelial dysfunction, which can also be induced by aberrant insulin signaling, contribute to atherosclerotic plaque formation.   6. Class 1 obesity with serious comorbidity and body mass index (BMI) of 30.0 to 30.9 in adult, unspecified obesity type  Course: Crystal Simmons is currently in the action stage of change. As such, her goal is to continue with weight loss efforts.   Nutrition goals: She has agreed to keeping a food journal and adhering to recommended goals of 1200-1500 calories and 85 grams of protein.   Exercise goals: For substantial health benefits, adults should do at least 150 minutes (2 hours and 30 minutes) a week of moderate-intensity, or 75 minutes (1 hour and 15 minutes) a week of vigorous-intensity aerobic physical activity, or an equivalent combination of moderate- and vigorous-intensity aerobic activity. Aerobic activity should be performed in episodes of at least 10 minutes, and preferably, it should be spread throughout the week.  Behavioral modification strategies: increasing lean protein intake, decreasing simple carbohydrates, increasing vegetables, dealing with family or coworker sabotage, travel eating strategies and holiday eating strategies .  Crystal Simmons has agreed to follow-up with our clinic in 4 weeks. She was informed of the importance of frequent follow-up visits to maximize her success with intensive lifestyle modifications for her multiple health conditions.   Objective:   Blood pressure 117/78, pulse 80, temperature 98.1 F (36.7 C), temperature source Oral, height 5\' 8"  (1.727 m), weight 201 lb (91.2 kg), last menstrual period 10/14/2005, SpO2 97 %. Body mass index is 30.56 kg/m.  General: Cooperative, alert, well developed, in no acute distress. HEENT: Conjunctivae and lids unremarkable. Cardiovascular: Regular rhythm.  Lungs: Normal work of  breathing. Neurologic: No focal deficits.   Lab Results  Component Value Date   CREATININE 0.87 12/11/2019   BUN 11 12/11/2019   NA 143 12/11/2019   K 4.5 12/11/2019   CL 104 12/11/2019   CO2 26 12/11/2019   Lab Results  Component Value Date   ALT 12 12/11/2019   AST 15 12/11/2019   ALKPHOS 95 12/11/2019   BILITOT 0.7 12/11/2019   Lab Results  Component Value Date   HGBA1C 5.8 (H) 12/11/2019   HGBA1C 5.9 (H) 09/01/2019   HGBA1C 5.8 (H) 05/01/2019   HGBA1C 5.4 12/13/2015   Lab Results  Component Value Date   INSULIN 5.1 12/11/2019   INSULIN 11.0 09/01/2019   INSULIN 8.9 05/01/2019   Lab Results  Component Value Date   TSH 1.990 05/01/2019   Lab Results  Component Value Date   CHOL 149 05/01/2019   HDL 60 05/01/2019   LDLCALC 76 05/01/2019   TRIG 63 05/01/2019   CHOLHDL 2.5 05/01/2019   Lab Results  Component Value Date   WBC 7.2 05/01/2019   HGB 13.0 05/01/2019   HCT 40.0 05/01/2019   MCV 95 05/01/2019   PLT 340 05/01/2019   Lab Results  Component Value Date   IRON 82 05/01/2019   TIBC 296 05/01/2019   FERRITIN 72 05/01/2019   Attestation Statements:   Reviewed by clinician on day of visit: allergies, medications, problem list, medical history, surgical history, family history, social history, and previous encounter notes.  I, Water quality scientist, CMA, am acting as transcriptionist for Briscoe Deutscher, DO  I have reviewed the above documentation for accuracy and completeness, and I agree  with the above. Briscoe Deutscher, DO

## 2020-02-04 ENCOUNTER — Encounter (INDEPENDENT_AMBULATORY_CARE_PROVIDER_SITE_OTHER): Payer: Self-pay | Admitting: Family Medicine

## 2020-02-04 NOTE — Patient Instructions (Signed)
Ankle Sprain  An ankle sprain is a stretch or tear in a ligament in the ankle. Ligaments are tissues that connect bones to each other. The two most common types of ankle sprains are:  Inversion sprain. This happens when the foot turns inward and the ankle rolls outward. It affects the ligament on the outside of the foot (lateral ligament).  Eversion sprain. This happens when the foot turns outward and the ankle rolls inward. It affects the ligament on the inner side of the foot (medial ligament). What are the causes? This condition is often caused by accidentally rolling or twisting the ankle. What increases the risk? You are more likely to develop this condition if you play sports. What are the signs or symptoms? Symptoms of this condition include:  Pain in your ankle.  Swelling.  Bruising. This may develop right after you sprain your ankle or 1-2 days later.  Trouble standing or walking, especially when you turn or change directions. How is this diagnosed? This condition is diagnosed with:  A physical exam. During the exam, your health care provider will press on certain parts of your foot and ankle and try to move them in certain ways.  X-ray imaging. These may be taken to see how severe the sprain is and to check for broken bones. How is this treated? This condition may be treated with:  A brace or splint. This is used to keep the ankle from moving until it heals.  An elastic bandage. This is used to support the ankle.  Crutches.  Pain medicine.  Surgery. This may be needed if the sprain is severe.  Physical therapy. This may help to improve the range of motion in the ankle. Follow these instructions at home: If you have a brace or a splint:  Wear the brace or splint as told by your health care provider. Remove it only as told by your health care provider.  Loosen the brace or splint if your toes tingle, become numb, or turn cold and blue.  Keep the brace or  splint clean.  If the brace or splint is not waterproof: ? Do not let it get wet. ? Cover it with a watertight covering when you take a bath or a shower. If you have an elastic bandage (dressing):  Remove it to shower or bathe.  Try not to move your ankle much, but wiggle your toes from time to time. This helps to prevent swelling.  Adjust the dressing to make it more comfortable if it feels too tight.  Loosen the dressing if you have numbness or tingling in your foot, or if your foot becomes cold and blue. Managing pain, stiffness, and swelling   Take over-the-counter and prescription medicines only as told by your health care provider.  For 2-3 days, keep your ankle raised (elevated) above the level of your heart as much as possible.  If directed, put ice on the injured area: ? If you have a removable brace or splint, remove it as told by your health care provider. ? Put ice in a plastic bag. ? Place a towel between your skin and the bag. ? Leave the ice on for 20 minutes, 2-3 times a day. General instructions  Rest your ankle.  Do not use the injured limb to support your body weight until your health care provider says that you can. Use crutches as told by your health care provider.  Do not use any products that contain nicotine or tobacco, such as   cigarettes, e-cigarettes, and chewing tobacco. If you need help quitting, ask your health care provider.  Keep all follow-up visits as told by your health care provider. This is important. Contact a health care provider if:  You have rapidly increasing bruising or swelling.  Your pain is not relieved with medicine. Get help right away if:  Your foot or toes become numb or blue.  You have severe pain that gets worse. Summary  An ankle sprain is a stretch or tear in a ligament in the ankle. Ligaments are tissues that connect bones to each other.  This condition is often caused by accidentally rolling or twisting the  ankle.  Symptoms include pain, swelling, bruising, and trouble walking.  To relieve pain and swelling, put ice on the affected ankle, raise your ankle above the level of your heart, and use an elastic bandage.  Keep all follow-up visits as told by your health care provider. This is important. This information is not intended to replace advice given to you by your health care provider. Make sure you discuss any questions you have with your health care provider. Document Revised: 10/22/2017 Document Reviewed: 06/26/2017 Elsevier Patient Education  2020 [employer]  

## 2020-02-07 ENCOUNTER — Encounter: Payer: Self-pay | Admitting: Podiatry

## 2020-02-10 ENCOUNTER — Telehealth: Payer: Self-pay

## 2020-02-10 NOTE — Telephone Encounter (Signed)
Prior Authorization received and submitted to plan for Estradiol vaginal ring. Waiting for response from plan.   Key: [id number]

## 2020-02-11 ENCOUNTER — Ambulatory Visit: Payer: BC Managed Care – PPO | Admitting: Podiatry

## 2020-02-18 ENCOUNTER — Telehealth: Payer: BC Managed Care – PPO | Admitting: Family Medicine

## 2020-02-18 ENCOUNTER — Encounter: Payer: Self-pay | Admitting: Family Medicine

## 2020-02-18 VITALS — Temp 97.9°F | Wt 201.0 lb

## 2020-02-18 DIAGNOSIS — J0101 Acute recurrent maxillary sinusitis: Secondary | ICD-10-CM

## 2020-02-18 MED ORDER — AMOXICILLIN 875 MG PO TABS: 875.0000 mg | ORAL_TABLET | Freq: Two times a day (BID) | ORAL | 0 refills | Status: DC

## 2020-02-18 NOTE — Progress Notes (Signed)
   Subjective:    Patient ID: Crystal Simmons, female    DOB: [birth year], 60 y.o.   MRN: [id number]  HPI Telemedicine attempted telemedicine was tried but unable to get her computer to work.  She was at home alone.  I am in my office. She has a 1 week history of left-sided sinus pressure, upper tooth discomfort, purulent postnasal drainage but no fever or chills.  She does have underlying allergies and has been using Flonase.  She does not smoke.  She did recently do a Covid test which was negative.   Review of Systems     Objective:   Physical Exam Alert and in no distress.  When she would push on her left maxillary sinus, the pressure would be relieved.       Assessment & Plan:  Acute recurrent maxillary sinusitis - Plan: amoxicillin (AMOXIL) 875 MG tablet Amoxil usually works for her in the situation I will therefore renew it. 20 minutes spent in review of her record, documentation and counseling.

## 2020-03-01 DIAGNOSIS — Z860100 Personal history of colon polyps, unspecified: Secondary | ICD-10-CM | POA: Insufficient documentation

## 2020-03-01 DIAGNOSIS — K5904 Chronic idiopathic constipation: Secondary | ICD-10-CM | POA: Insufficient documentation

## 2020-03-01 NOTE — Progress Notes (Signed)
TeleHealth Visit:  Due to the COVID-19 pandemic, this visit was completed with telemedicine (audio/video) technology to reduce patient and provider exposure as well as to preserve personal protective equipment.   Crystal Simmons has verbally consented to this TeleHealth visit. The patient is located at home, the provider is located at the [facility] office. The participants in this visit include the listed provider and patient and. The visit was conducted today via [place].  OBESITY Crystal Simmons is here to discuss her progress with her obesity treatment plan along with follow-up of her obesity related diagnoses.   Today's visit was #: 11 Starting weight: 211 lbs Starting date: 05/01/2019 Today's weight: 201 lbs Today's date: 03/02/2020 Total lbs lost to date: 10 lbs Total weight loss percentage to date: -4.74%  Interim History: Crystal Simmons went to [place] last month for 6 days.  She says she gained 5 pounds, but she is now back on track.  Her reported weight today is 201.2 pounds.    Goals:  Increase to 50,000 steps per week.  Get in 1 hour of aerobics per day on most days and do strength training 2 times per week.  Nutrition Plan: keeping a food journal and adhering to recommended goals of 1200-1500 calories and 85 grams of protein.  Anti-obesity medications: phentermine 18.6 mg daily, metformin 500 mg daily. Reported side effects: None.  Assessment/Plan:   1. Prediabetes Improving, but not optimized. Goal is HgbA1c < 5.7.  Medication: metformin 500 mg daily.  She will continue to focus on protein-rich, low simple carbohydrate foods. We reviewed the importance of hydration, regular exercise for stress reduction, and restorative sleep.   Lab Results  Component Value Date   HGBA1C 5.8 (H) 12/11/2019   Lab Results  Component Value Date   INSULIN 5.1 12/11/2019   INSULIN 11.0 09/01/2019   INSULIN 8.9 05/01/2019   -Refill metFORMIN (GLUCOPHAGE) 500 MG tablet; Take 1 tablet (500  mg total) by mouth daily.  Dispense: 30 tablet; Refill: 0  2. Abnormal metabolism Crystal Simmons's metabolism is slower than normal.  -Refill phentermine (ADIPEX-P) 37.5 MG tablet; Take 0.5 tablets (18.6 mg total) by mouth daily before breakfast.  Dispense: 15 tablet; Refill: 0  3. Other insomnia Plan: Recommend sleep hygiene measures including regular sleep schedule, optimal sleep environment, and relaxing presleep rituals. Medication: trazodone 25-50 mg at bedtime as needed for sleep.  - Refill traZODone (DESYREL) 50 MG tablet; Take 0.5-1 tablets (25-50 mg total) by mouth at bedtime as needed for sleep.  Dispense: 30 tablet; Refill: 2  4. Class 1 obesity with serious comorbidity and body mass index (BMI) of 30.0 to 30.9 in adult, unspecified obesity type  - Refill phentermine (ADIPEX-P) 37.5 MG tablet; Take 0.5 tablets (18.6 mg total) by mouth daily before breakfast.  Dispense: 15 tablet; Refill: 0  Having again reminded the patient of the "off label" use of Phentermine beyond three consecutive months, and again discussing the risks, benefits, contraindications, and limitations of it's use; given it's role in the successful treatment of obesity thus far and lack of adverse effect, patient has expressed desire and given informed verbal consent to continue use.    I have consulted the [place] Controlled Substances Registry for this patient, and feel the risk/benefit ratio today is favorable for proceeding with this prescription for a controlled substance. The patient understands monitoring parameters and red flags.   Crystal Simmons is currently in the action stage of change. As such, her goal is to continue with weight loss  efforts. She has agreed to keeping a food journal and adhering to recommended goals of 1200-1500 calories and 85 grams of protein.   Exercise goals: For substantial health benefits, adults should do at least 150 minutes (2 hours and 30 minutes) a week of moderate-intensity, or 75 minutes (1 hour  and 15 minutes) a week of vigorous-intensity aerobic physical activity, or an equivalent combination of moderate- and vigorous-intensity aerobic activity. Aerobic activity should be performed in episodes of at least 10 minutes, and preferably, it should be spread throughout the week.  Behavioral modification strategies: increasing lean protein intake, decreasing simple carbohydrates, increasing vegetables and increasing water intake.  Crystal Simmons has agreed to follow-up with our clinic in 3 weeks, fasting. She was informed of the importance of frequent follow-up visits to maximize her success with intensive lifestyle modifications for her multiple health conditions.  Objective:   VITALS: Per patient if applicable, see vitals. GENERAL: Alert and in no acute distress. CARDIOPULMONARY: No increased WOB. Speaking in clear sentences.  PSYCH: Pleasant and cooperative. Speech normal rate and rhythm. Affect is appropriate. Insight and judgement are appropriate. Attention is focused, linear, and appropriate.  NEURO: Oriented as arrived to appointment on time with no prompting.   Lab Results  Component Value Date   CREATININE 0.87 12/11/2019   BUN 11 12/11/2019   NA 143 12/11/2019   K 4.5 12/11/2019   CL 104 12/11/2019   CO2 26 12/11/2019   Lab Results  Component Value Date   ALT 12 12/11/2019   AST 15 12/11/2019   ALKPHOS 95 12/11/2019   BILITOT 0.7 12/11/2019   Lab Results  Component Value Date   HGBA1C 5.8 (H) 12/11/2019   HGBA1C 5.9 (H) 09/01/2019   HGBA1C 5.8 (H) 05/01/2019   HGBA1C 5.4 12/13/2015   Lab Results  Component Value Date   INSULIN 5.1 12/11/2019   INSULIN 11.0 09/01/2019   INSULIN 8.9 05/01/2019   Lab Results  Component Value Date   TSH 1.990 05/01/2019   Lab Results  Component Value Date   CHOL 149 05/01/2019   HDL 60 05/01/2019   LDLCALC 76 05/01/2019   TRIG 63 05/01/2019   CHOLHDL 2.5 05/01/2019   Lab Results  Component Value Date   WBC 7.2 05/01/2019    HGB 13.0 05/01/2019   HCT 40.0 05/01/2019   MCV 95 05/01/2019   PLT 340 05/01/2019   Lab Results  Component Value Date   IRON 82 05/01/2019   TIBC 296 05/01/2019   FERRITIN 72 05/01/2019    Attestation Statements:   Reviewed by clinician on day of visit: allergies, medications, problem list, medical history, surgical history, family history, social history, and previous encounter notes.  I, Water quality scientist, CMA, am acting as transcriptionist for Briscoe Deutscher, DO  I have reviewed the above documentation for accuracy and completeness, and I agree with the above. Briscoe Deutscher, DO

## 2020-03-02 ENCOUNTER — Telehealth (INDEPENDENT_AMBULATORY_CARE_PROVIDER_SITE_OTHER): Payer: BC Managed Care – PPO | Admitting: Family Medicine

## 2020-03-02 ENCOUNTER — Other Ambulatory Visit: Payer: Self-pay

## 2020-03-02 ENCOUNTER — Encounter (INDEPENDENT_AMBULATORY_CARE_PROVIDER_SITE_OTHER): Payer: Self-pay | Admitting: Family Medicine

## 2020-03-02 DIAGNOSIS — R948 Abnormal results of function studies of other organs and systems: Secondary | ICD-10-CM | POA: Diagnosis not present

## 2020-03-02 DIAGNOSIS — E669 Obesity, unspecified: Secondary | ICD-10-CM | POA: Diagnosis not present

## 2020-03-02 DIAGNOSIS — R7303 Prediabetes: Secondary | ICD-10-CM | POA: Diagnosis not present

## 2020-03-02 DIAGNOSIS — G4709 Other insomnia: Secondary | ICD-10-CM

## 2020-03-02 DIAGNOSIS — Z6830 Body mass index (BMI) 30.0-30.9, adult: Secondary | ICD-10-CM

## 2020-03-02 MED ORDER — TRAZODONE HCL 50 MG PO TABS: 25.0000 mg | ORAL_TABLET | Freq: Every evening | ORAL | 2 refills | Status: DC | PRN

## 2020-03-02 MED ORDER — METFORMIN HCL 500 MG PO TABS: 500.0000 mg | ORAL_TABLET | Freq: Every day | ORAL | 0 refills | Status: DC

## 2020-03-02 MED ORDER — PHENTERMINE HCL 37.5 MG PO TABS: 18.6000 mg | ORAL_TABLET | Freq: Every day | ORAL | 0 refills | Status: DC

## 2020-03-17 DIAGNOSIS — H35413 Lattice degeneration of retina, bilateral: Secondary | ICD-10-CM | POA: Diagnosis not present

## 2020-03-31 ENCOUNTER — Ambulatory Visit (INDEPENDENT_AMBULATORY_CARE_PROVIDER_SITE_OTHER): Payer: BC Managed Care – PPO | Admitting: Family Medicine

## 2020-03-31 ENCOUNTER — Encounter (INDEPENDENT_AMBULATORY_CARE_PROVIDER_SITE_OTHER): Payer: Self-pay | Admitting: Family Medicine

## 2020-03-31 ENCOUNTER — Other Ambulatory Visit: Payer: Self-pay

## 2020-03-31 VITALS — BP 114/77 | HR 86 | Temp 98.3°F | Ht 68.0 in | Wt 200.0 lb

## 2020-03-31 DIAGNOSIS — R948 Abnormal results of function studies of other organs and systems: Secondary | ICD-10-CM

## 2020-03-31 DIAGNOSIS — Z6830 Body mass index (BMI) 30.0-30.9, adult: Secondary | ICD-10-CM

## 2020-03-31 DIAGNOSIS — E8881 Metabolic syndrome: Secondary | ICD-10-CM

## 2020-03-31 DIAGNOSIS — E559 Vitamin D deficiency, unspecified: Secondary | ICD-10-CM

## 2020-03-31 DIAGNOSIS — H40013 Open angle with borderline findings, low risk, bilateral: Secondary | ICD-10-CM | POA: Diagnosis not present

## 2020-03-31 DIAGNOSIS — Z9189 Other specified personal risk factors, not elsewhere classified: Secondary | ICD-10-CM | POA: Diagnosis not present

## 2020-03-31 DIAGNOSIS — R6889 Other general symptoms and signs: Secondary | ICD-10-CM | POA: Diagnosis not present

## 2020-03-31 DIAGNOSIS — R7303 Prediabetes: Secondary | ICD-10-CM | POA: Diagnosis not present

## 2020-03-31 DIAGNOSIS — E669 Obesity, unspecified: Secondary | ICD-10-CM

## 2020-03-31 MED ORDER — METFORMIN HCL 500 MG PO TABS: 500.0000 mg | ORAL_TABLET | Freq: Every day | ORAL | 0 refills | Status: DC

## 2020-04-01 ENCOUNTER — Encounter: Payer: Self-pay | Admitting: Orthopedic Surgery

## 2020-04-01 ENCOUNTER — Ambulatory Visit: Payer: BC Managed Care – PPO | Admitting: Physician Assistant

## 2020-04-01 DIAGNOSIS — M170 Bilateral primary osteoarthritis of knee: Secondary | ICD-10-CM | POA: Diagnosis not present

## 2020-04-01 LAB — COMPREHENSIVE METABOLIC PANEL
ALT: 13 IU/L (ref 0–32)
AST: 16 IU/L (ref 0–40)
Albumin/Globulin Ratio: 1.4 (ref 1.2–2.2)
Albumin: 4.1 g/dL (ref 3.8–4.9)
Alkaline Phosphatase: 83 IU/L (ref 44–121)
BUN/Creatinine Ratio: 13 (ref 9–23)
BUN: 12 mg/dL (ref 6–24)
Bilirubin Total: 0.6 mg/dL (ref 0.0–1.2)
CO2: 23 mmol/L (ref 20–29)
Calcium: 9.5 mg/dL (ref 8.7–10.2)
Chloride: 102 mmol/L (ref 96–106)
Creatinine, Ser: 0.92 mg/dL (ref 0.57–1.00)
GFR calc Af Amer: 79 mL/min/{1.73_m2} (ref >59)
GFR calc non Af Amer: 68 mL/min/{1.73_m2} (ref >59)
Globulin, Total: 2.9 g/dL (ref 1.5–4.5)
Glucose: 99 mg/dL (ref 65–99)
Potassium: 4.3 mmol/L (ref 3.5–5.2)
Sodium: 140 mmol/L (ref 134–144)
Total Protein: 7 g/dL (ref 6.0–8.5)

## 2020-04-01 LAB — CBC WITH DIFFERENTIAL/PLATELET
Basophils Absolute: 0.1 10*3/uL (ref 0.0–0.2)
Basos: 1 %
EOS (ABSOLUTE): 0.1 10*3/uL (ref 0.0–0.4)
Eos: 1 %
Hemoglobin: 12.8 g/dL (ref 11.1–15.9)
Immature Grans (Abs): 0 10*3/uL (ref 0.0–0.1)
Immature Granulocytes: 0 %
Lymphocytes Absolute: 3 10*3/uL (ref 0.7–3.1)
Lymphs: 45 %
MCH: 31.4 pg (ref 26.6–33.0)
MCHC: 33.2 g/dL (ref 31.5–35.7)
MCV: 95 fL (ref 79–97)
Monocytes Absolute: 0.4 10*3/uL (ref 0.1–0.9)
Monocytes: 7 %
Neutrophils Absolute: 3.1 10*3/uL (ref 1.4–7.0)
Neutrophils: 46 %
Platelets: 300 10*3/uL (ref 150–450)
RBC: 4.08 x10E6/uL (ref 3.77–5.28)
RDW: 12.5 % (ref 11.7–15.4)
WBC: 6.6 10*3/uL (ref 3.4–10.8)

## 2020-04-01 LAB — LIPID PANEL
Chol/HDL Ratio: 2.5 ratio (ref 0.0–4.4)
Cholesterol, Total: 167 mg/dL (ref 100–199)
HDL: 66 mg/dL (ref >39)
LDL Chol Calc (NIH): 87 mg/dL (ref 0–99)
Triglycerides: 72 mg/dL (ref 0–149)
VLDL Cholesterol Cal: 14 mg/dL (ref 5–40)

## 2020-04-01 LAB — ANEMIA PANEL
Ferritin: 65 ng/mL (ref 15–150)
Folate, Hemolysate: 521 ng/mL
Folate, RBC: 1350 ng/mL (ref >498)
Hematocrit: 38.6 % (ref 34.0–46.6)
Iron Saturation: 35 % (ref 15–55)
Iron: 93 ug/dL (ref 27–159)
Retic Ct Pct: 1.4 % (ref 0.6–2.6)
Total Iron Binding Capacity: 264 ug/dL (ref 250–450)
UIBC: 171 ug/dL (ref 131–425)
Vitamin B-12: 1108 pg/mL (ref 232–1245)

## 2020-04-01 LAB — INSULIN, RANDOM: INSULIN: 6.3 u[IU]/mL (ref 2.6–24.9)

## 2020-04-01 LAB — TSH: TSH: 2.86 u[IU]/mL (ref 0.450–4.500)

## 2020-04-01 LAB — HEMOGLOBIN A1C
Est. average glucose Bld gHb Est-mCnc: 114 mg/dL
Hgb A1c MFr Bld: 5.6 % (ref 4.8–5.6)

## 2020-04-01 LAB — VITAMIN D 25 HYDROXY (VIT D DEFICIENCY, FRACTURES): Vit D, 25-Hydroxy: 49.6 ng/mL (ref 30.0–100.0)

## 2020-04-01 MED ORDER — LIDOCAINE HCL 1 % IJ SOLN
5.0000 mL | INTRAMUSCULAR | Status: AC | PRN
  Administered 2020-04-01: 5 mL

## 2020-04-01 MED ORDER — METHYLPREDNISOLONE ACETATE 40 MG/ML IJ SUSP
40.0000 mg | INTRAMUSCULAR | Status: AC | PRN
  Administered 2020-04-01: 40 mg via INTRA_ARTICULAR

## 2020-04-01 MED ORDER — LIDOCAINE HCL 1 % IJ SOLN
5.0000 mL | INTRAMUSCULAR | Status: AC | PRN
Start: 2020-04-01 — End: 2020-04-01
  Administered 2020-04-01: 5 mL

## 2020-04-01 NOTE — Progress Notes (Signed)
Office Visit Note   Patient: Crystal Simmons           Date of Birth: 05-27-1960           MRN: [id number] Visit Date: 04/01/2020              Requested by: Denita Lung, MD [place],  [place] [address] PCP: Denita Lung, MD  No chief complaint on file.     HPI: Patient is a pleasant 60 year old woman with a history of bilateral knee arthritis.  She does very well with cortisone injections.  Last injections were over a year ago.  She thinks may be things that irritated as she has been in the gym more doing cardio.  No trauma  Assessment & Plan: Visit Diagnoses: No diagnosis found.  Plan: We will follow-up as needed.  Can we will continue to keep her leg strong.  Follow-Up Instructions: No follow-ups on file.   Ortho Exam  Patient is alert, oriented, no adenopathy, well-dressed, normal affect, normal respiratory effort. Bilateral lower extremities.  No effusion no soft tissue swelling.  Tender bilaterally over the medial joint lines.  Imaging: No results found. No images are attached to the encounter.  Labs: Lab Results  Component Value Date   HGBA1C 5.6 03/31/2020   HGBA1C 5.8 (H) 12/11/2019   HGBA1C 5.9 (H) 09/01/2019   LABORGA NO GROWTH 02/29/2016     Lab Results  Component Value Date   ALBUMIN 4.1 03/31/2020   ALBUMIN 4.2 12/11/2019   ALBUMIN 4.0 09/01/2019    No results found for: MG Lab Results  Component Value Date   VD25OH 49.6 03/31/2020   VD25OH 78.8 12/11/2019   VD25OH 41.6 09/01/2019    No results found for: PREALBUMIN CBC EXTENDED Latest Ref Rng & Units 03/31/2020 05/01/2019 01/15/2018  WBC 3.4 - 10.8 x10E3/uL 6.6 7.2 9.1  RBC 3.77 - 5.28 x10E6/uL 4.08 4.21 3.97  HGB 11.1 - 15.9 g/dL 12.8 13.0 12.2  HCT 34.0 - 46.6 % 38.6 40.0 37.3  PLT 150 - 450 x10E3/uL 300 340 316  NEUTROABS 1.4 - 7.0 x10E3/uL 3.1 4.1 -  LYMPHSABS 0.7 - 3.1 x10E3/uL 3.0 2.5 -     There is no height or weight on file to calculate BMI.  Orders:   No orders of the defined types were placed in this encounter.  No orders of the defined types were placed in this encounter.    Procedures: Large Joint Inj: bilateral knee on 04/01/2020 8:24 AM Indications: pain and diagnostic evaluation Details: 22 G 1.5 in needle, anteromedial approach  Arthrogram: No  Medications (Right): 5 mL lidocaine 1 %; 40 mg methylPREDNISolone acetate 40 MG/ML Medications (Left): 5 mL lidocaine 1 %; 40 mg methylPREDNISolone acetate 40 MG/ML Outcome: tolerated well, no immediate complications Procedure, treatment alternatives, risks and benefits explained, specific risks discussed. Consent was given by the patient.      Clinical Data: No additional findings.  ROS:  All other systems negative, except as noted in the HPI. Review of Systems  Objective: Vital Signs: LMP 10/14/2005 (Approximate)   Specialty Comments:  No specialty comments available.  PMFS History: Patient Active Problem List   Diagnosis Date Noted  . Chronic idiopathic constipation 03/01/2020  . Personal history of colonic polyps 03/01/2020  . Prediabetes 11/27/2019  . Insomnia 11/27/2019  . Class 1 obesity with serious comorbidity and body mass index (BMI) of 30.0 to 30.9 in adult 11/27/2019  . Bilateral primary osteoarthritis of knee 05/18/2016  .  Post-menopausal atrophic vaginitis 02/29/2016   Past Medical History:  Diagnosis Date  . Arthritis   . Back pain   . 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 Constipation   . Endometriosis   . Fibroids 10/95  . GERD (gastroesophageal reflux disease)   . 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 Insomnia   . Joint pain   . Lactose intolerance   . Menopausal state 2007  . Seasonal allergies   . Shingles 04/2018  . STD (sexually transmitted disease) 8/09   chlamydia  . Vitamin D deficiency disease 2010    Family History  Problem Relation Age of Onset  . Hypertension Mother   . Diabetes Father   . Hypertension  Father   . Heart disease Father   . Sudden death Father   . Diabetes Brother   . Diabetes Paternal Grandfather     Past Surgical History:  Procedure Laterality Date  . BREAST SURGERY Left 6/03   Biopsy negative  . CERVIX LESION DESTRUCTION  4/92  . COLONOSCOPY W/ BIOPSIES  09/26/10   polyp benign  . COLPOSCOPY  10/12   CIN I  . KNEE SURGERY  12/2009   left for meniscal tear  . LAPAROSCOPY  7/02   multiple surgeries, including LSO, LOA, and endometrioma, and fibroids secondary to endometriosis  . UMBILICAL EXPLORATION  9/50   for endometrioma    Social History   Occupational History  . 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 Smoking status: Never Smoker  . Smokeless tobacco: Never Used  Vaping Use  . Vaping Use: Never used  Substance and Sexual Activity  . Alcohol use: Yes    Comment: 2-3 times a month  . Drug use: No  . Sexual activity: Yes    Partners: Male    Birth control/protection: Post-menopausal, None

## 2020-04-05 ENCOUNTER — Encounter (INDEPENDENT_AMBULATORY_CARE_PROVIDER_SITE_OTHER): Payer: Self-pay | Admitting: Family Medicine

## 2020-04-05 NOTE — Progress Notes (Signed)
Chief Complaint:   OBESITY Crystal Simmons is here to discuss her progress with her obesity treatment plan along with follow-up of her obesity related diagnoses.   Today's visit was #: 12 Starting weight: 211 lbs Starting date: 05/01/2019 Today's weight: 200 lbs Today's date: 03/31/2020 Total lbs lost to date: 11 lbs Body mass index is 30.41 kg/m.  Total weight loss percentage to date: -5.21%  Interim History: Crystal Simmons has been having some left knee pain.  She will be getting a cortisone injection tomorrow.  She says she is frustrated with her lack of weight loss.  Nutrition Plan: keeping a food journal and adhering to recommended goals of 1200-1500 calories and 85 grams of protein for 90% of the time. Anti-obesity medications: phentermine. Reported side effects: None. Activity: Cardio/strength training for 60 minutes 5-6 times per week.  Assessment/Plan:   1. Prediabetes At goal. Goal is HgbA1c < 5.7.  Medication: metformin 500 mg daily.    Plan:  She will continue to focus on protein-rich, low simple carbohydrate foods. We reviewed the importance of hydration, regular exercise for stress reduction, and restorative sleep.  Continue metformin.  Will check labs today.  Lab Results  Component Value Date   HGBA1C 5.8 12/11/2019   Lab Results  Component Value Date   INSULIN 5.1 12/11/2019   INSULIN 11.0 09/01/2019   INSULIN 8.9 05/01/2019   - Comprehensive metabolic panel - Hemoglobin A1c - Insulin, random  2. Abnormal metabolism Crystal Simmons's metabolism is slower than normal.  Plan:  Check labs today.  - CBC with Differential/Platelet - Lipid panel - TSH - Anemia panel  3. Vitamin D deficiency At goal. Current vitamin D is 78.8, tested on 12/11/2019. Optimal goal > 50 ng/dL.   Plan: Continue current OTC vitamin D supplementation.  Will check vitamin D level today.  - VITAMIN D 25 Hydroxy (Vit-D Deficiency, Fractures)  4. Metabolic syndrome Starting goal: Lose 7-10% of  starting weight. She will continue to focus on protein-rich, low simple carbohydrate foods. We reviewed the importance of hydration, regular exercise for stress reduction, and restorative sleep.  We will continue to check lab work every 3 months, with 10% weight loss, or should any other concerns arise.  - CBC with Differential/Platelet - Lipid panel - TSH - Anemia panel  5. At risk for heart disease Due to [name]'s current state of health and medical condition(s), she is at a higher risk for heart disease.  This puts the patient at much greater risk to subsequently develop cardiopulmonary conditions that can significantly affect patient's quality of life in a negative manner.    At least 18 minutes were spent on counseling Deyani about these concerns today, and I stressed the importance of reversing risks factors of obesity, especially truncal and visceral fat, hypertension, hyperlipidemia, and pre-diabetes.  The initial goal is to lose at least 5-10% of starting weight to help reduce these risk factors.  Counseling:  Intensive lifestyle modifications were discussed with Crystal Simmons as the most appropriate first line of treatment.  she will continue to work on diet, exercise, and weight loss efforts.  We will continue to reassess these conditions on a fairly regular basis in an attempt to decrease the patient's overall morbidity and mortality.  Evidence-based interventions for health behavior change were utilized today including the discussion of self monitoring techniques, problem-solving barriers, and SMART goal setting techniques.  Specifically, regarding patient's less desirable eating habits and patterns, we employed the technique of small changes when Crystal Simmons has not  been able to fully commit to her prudent nutritional plan.  6. Class 1 obesity with serious comorbidity and body mass index (BMI) of 30.0 to 30.9 in adult, unspecified obesity type  Course: Adrianna is currently in the action stage of  change. As such, her goal is to continue with weight loss efforts.   Nutrition goals: She has agreed to keeping a food journal and adhering to recommended goals of 1200-1500 calories and 95 grams of protein.   Exercise goals: As is.  Behavioral modification strategies: increasing lean protein intake, decreasing simple carbohydrates, increasing vegetables and increasing water intake.  Crystal Simmons has agreed to follow-up with our clinic in 2 weeks.  Will do IC at next visit. She was informed of the importance of frequent follow-up visits to maximize her success with intensive lifestyle modifications for her multiple health conditions.   Crystal Simmons was informed we would discuss her lab results at her next visit unless there is a critical issue that needs to be addressed sooner. Crystal Simmons agreed to keep her next visit at the agreed upon time to discuss these results.  Objective:   Blood pressure 114/77, pulse 86, temperature 98.3 F (36.8 C), temperature source Oral, height 5\' 8"  (1.727 m), weight 200 lb (90.7 kg), last menstrual period 10/14/2005, SpO2 95 %. Body mass index is 30.41 kg/m.  General: Cooperative, alert, well developed, in no acute distress. HEENT: Conjunctivae and lids unremarkable. Cardiovascular: Regular rhythm.  Lungs: Normal work of breathing. Neurologic: No focal deficits.   Lab Results  Component Value Date   HGBA1C 5.8 (H) 12/11/2019   HGBA1C 5.9 (H) 09/01/2019   HGBA1C 5.8 (H) 05/01/2019   HGBA1C 5.4 12/13/2015   Lab Results  Component Value Date   INSULIN 5.1 12/11/2019   INSULIN 11.0 09/01/2019   INSULIN 8.9 05/01/2019   Attestation Statements:   Reviewed by clinician on day of visit: allergies, medications, problem list, medical history, surgical history, family history, social history, and previous encounter notes.  I, Water quality scientist, CMA, am acting as transcriptionist for Crystal Deutscher, DO  I have reviewed the above documentation for accuracy and completeness,  and I agree with the above. Crystal Deutscher, DO

## 2020-04-21 ENCOUNTER — Ambulatory Visit (INDEPENDENT_AMBULATORY_CARE_PROVIDER_SITE_OTHER): Payer: BC Managed Care – PPO | Admitting: Family Medicine

## 2020-05-04 ENCOUNTER — Other Ambulatory Visit: Payer: Self-pay | Admitting: Nurse Practitioner

## 2020-05-04 DIAGNOSIS — Z1231 Encounter for screening mammogram for malignant neoplasm of breast: Secondary | ICD-10-CM

## 2020-05-08 ENCOUNTER — Ambulatory Visit
Admission: RE | Admit: 2020-05-08 | Discharge: 2020-05-08 | Disposition: A | Discharge status: Home / Self Care | Payer: BC Managed Care – PPO | Source: Ambulatory Visit | Attending: Nurse Practitioner | Admitting: Nurse Practitioner

## 2020-05-08 ENCOUNTER — Other Ambulatory Visit: Payer: Self-pay

## 2020-05-08 DIAGNOSIS — Z1231 Encounter for screening mammogram for malignant neoplasm of breast: Secondary | ICD-10-CM | POA: Diagnosis not present

## 2020-05-10 ENCOUNTER — Other Ambulatory Visit (INDEPENDENT_AMBULATORY_CARE_PROVIDER_SITE_OTHER): Payer: Self-pay | Admitting: Physician Assistant

## 2020-05-10 ENCOUNTER — Other Ambulatory Visit: Payer: Self-pay

## 2020-05-10 ENCOUNTER — Ambulatory Visit (INDEPENDENT_AMBULATORY_CARE_PROVIDER_SITE_OTHER): Payer: BC Managed Care – PPO | Admitting: Physician Assistant

## 2020-05-10 VITALS — BP 125/88 | HR 66 | Temp 98.3°F | Ht 68.0 in | Wt 196.0 lb

## 2020-05-10 DIAGNOSIS — Z9189 Other specified personal risk factors, not elsewhere classified: Secondary | ICD-10-CM

## 2020-05-10 DIAGNOSIS — R0602 Shortness of breath: Secondary | ICD-10-CM

## 2020-05-10 DIAGNOSIS — R7303 Prediabetes: Secondary | ICD-10-CM

## 2020-05-10 DIAGNOSIS — E669 Obesity, unspecified: Secondary | ICD-10-CM | POA: Diagnosis not present

## 2020-05-10 DIAGNOSIS — R948 Abnormal results of function studies of other organs and systems: Secondary | ICD-10-CM

## 2020-05-10 DIAGNOSIS — E66811 Obesity, class 1: Secondary | ICD-10-CM

## 2020-05-10 DIAGNOSIS — Z6830 Body mass index (BMI) 30.0-30.9, adult: Secondary | ICD-10-CM

## 2020-05-10 MED ORDER — METFORMIN HCL 500 MG PO TABS
500.0000 mg | ORAL_TABLET | Freq: Every day | ORAL | 0 refills | Status: DC
Start: 2020-05-10 — End: 2020-06-21

## 2020-05-12 DIAGNOSIS — Z0000 Encounter for general adult medical examination without abnormal findings: Secondary | ICD-10-CM | POA: Diagnosis not present

## 2020-05-12 NOTE — Progress Notes (Signed)
Chief Complaint:   OBESITY Caniyah is here to discuss her progress with her obesity treatment plan along with follow-up of her obesity related diagnoses. Samuella is on keeping a food journal and adhering to recommended goals of 1200-1500 calories and 95 grams of protein daily and states she is following her eating plan approximately 90% of the time. Cheila states she is doing weights, cardio, and walking for 60 minutes 5 times per week.  Today's visit was #: 13 Starting weight: 211 lbs Starting date: 05/01/2019 Today's weight: 196 lbs Today's date: 05/10/2020 Total lbs lost to date: 15 Total lbs lost since last in-office visit: 4  Interim History: Regena is averaging 83 grams of protein daily and staying around 1300 calories daily. Her IC reveals a decrease in her RMR today.  Subjective:   1. Pre-diabetes Janesa is on metformin, and she denies nausea, vomiting, or diarrhea. I discussed labs with the patient today.  2. SOB (shortness of breath) on exertion Necola notes shortness of breath with exertion. She denies dizziness or lightheadedness.  3. At risk for diabetes mellitus Nathaly is at higher than average risk for developing diabetes due to obesity.   Assessment/Plan:   1. Pre-diabetes Symphanie will continue to work on weight loss, exercise, and decreasing simple carbohydrates to help decrease the risk of diabetes. We will refill metformin for 1 month.  - metFORMIN (GLUCOPHAGE) 500 MG tablet; Take 1 tablet (500 mg total) by mouth daily.  Dispense: 30 tablet; Refill: 0  2. SOB (shortness of breath) on exertion IC was done today. [place] has agreed to work on weight loss and gradually increase exercise to treat her exercise induced shortness of breath. Will continue to monitor closely.  3. At risk for diabetes mellitus Robena was given approximately 15 minutes of diabetes education and counseling today. We discussed intensive lifestyle modifications today with an emphasis on  weight loss as well as increasing exercise and decreasing simple carbohydrates in her diet. We also reviewed medication options with an emphasis on risk versus benefit of those discussed.   Repetitive spaced learning was employed today to elicit superior memory formation and behavioral change.  4. Class 1 obesity with serious comorbidity and body mass index (BMI) of 30.0 to 30.9 in adult, unspecified obesity type Fleda is currently in the action stage of change. As such, her goal is to continue with weight loss efforts. She has agreed to keeping a food journal and adhering to recommended goals of 1200-1500 calories and 95 grams of protein daily.   We discussed various medication options to help Marylyn with her weight loss efforts and we both agreed to continue phentermine, and we will refill for 1 month.   Exercise goals: As is.  Behavioral modification strategies: meal planning and cooking strategies and keeping healthy foods in the home.  Velencia has agreed to follow-up with our clinic in 4 weeks. She was informed of the importance of frequent follow-up visits to maximize her success with intensive lifestyle modifications for her multiple health conditions.   Objective:   Blood pressure 125/88, pulse 66, temperature 98.3 F (36.8 C), height 5\' 8"  (1.727 m), weight 196 lb (88.9 kg), last menstrual period 10/14/2005, SpO2 100 %. Body mass index is 29.8 kg/m.  General: Cooperative, alert, well developed, in no acute distress. HEENT: Conjunctivae and lids unremarkable. Cardiovascular: Regular rhythm.  Lungs: Normal work of breathing. Neurologic: No focal deficits.   Lab Results  Component Value Date   CREATININE 0.92 03/31/2020  BUN 12 03/31/2020   NA 140 03/31/2020   K 4.3 03/31/2020   CL 102 03/31/2020   CO2 23 03/31/2020   Lab Results  Component Value Date   ALT 13 03/31/2020   AST 16 03/31/2020   ALKPHOS 83 03/31/2020   BILITOT 0.6 03/31/2020   Lab Results  Component  Value Date   HGBA1C 5.6 03/31/2020   HGBA1C 5.8 (H) 12/11/2019   HGBA1C 5.9 (H) 09/01/2019   HGBA1C 5.8 (H) 05/01/2019   HGBA1C 5.4 12/13/2015   Lab Results  Component Value Date   INSULIN 6.3 03/31/2020   INSULIN 5.1 12/11/2019   INSULIN 11.0 09/01/2019   INSULIN 8.9 05/01/2019   Lab Results  Component Value Date   TSH 2.860 03/31/2020   Lab Results  Component Value Date   CHOL 167 03/31/2020   HDL 66 03/31/2020   LDLCALC 87 03/31/2020   TRIG 72 03/31/2020   CHOLHDL 2.5 03/31/2020   Lab Results  Component Value Date   WBC 6.6 03/31/2020   HGB 12.8 03/31/2020   HCT 38.6 03/31/2020   MCV 95 03/31/2020   PLT 300 03/31/2020   Lab Results  Component Value Date   IRON 93 03/31/2020   TIBC 264 03/31/2020   FERRITIN 65 03/31/2020   Attestation Statements:   Reviewed by clinician on day of visit: allergies, medications, problem list, medical history, surgical history, family history, social history, and previous encounter notes.   Wilhemena Durie, am acting as transcriptionist for [employer], PA-C.  I have reviewed the above documentation for accuracy and completeness, and I agree with the above. Abby Potash, PA-C

## 2020-05-13 MED ORDER — PHENTERMINE HCL 37.5 MG PO TABS: 18.6000 mg | ORAL_TABLET | Freq: Every day | ORAL | 0 refills | Status: DC

## 2020-05-13 NOTE — Addendum Note (Signed)
Addended by: Briscoe Deutscher R on: 05/13/2020 11:10 AM   Modules accepted: Orders

## 2020-06-21 ENCOUNTER — Other Ambulatory Visit: Payer: Self-pay

## 2020-06-21 ENCOUNTER — Encounter (INDEPENDENT_AMBULATORY_CARE_PROVIDER_SITE_OTHER): Payer: Self-pay | Admitting: Family Medicine

## 2020-06-21 ENCOUNTER — Ambulatory Visit (INDEPENDENT_AMBULATORY_CARE_PROVIDER_SITE_OTHER): Payer: BC Managed Care – PPO | Admitting: Family Medicine

## 2020-06-21 VITALS — BP 123/76 | HR 80 | Temp 98.4°F | Ht 68.0 in | Wt 197.0 lb

## 2020-06-21 DIAGNOSIS — Z9189 Other specified personal risk factors, not elsewhere classified: Secondary | ICD-10-CM

## 2020-06-21 DIAGNOSIS — Z6832 Body mass index (BMI) 32.0-32.9, adult: Secondary | ICD-10-CM

## 2020-06-21 DIAGNOSIS — G4709 Other insomnia: Secondary | ICD-10-CM | POA: Diagnosis not present

## 2020-06-21 DIAGNOSIS — E66811 Obesity, class 1: Secondary | ICD-10-CM

## 2020-06-21 DIAGNOSIS — E669 Obesity, unspecified: Secondary | ICD-10-CM | POA: Diagnosis not present

## 2020-06-21 DIAGNOSIS — R948 Abnormal results of function studies of other organs and systems: Secondary | ICD-10-CM | POA: Diagnosis not present

## 2020-06-21 DIAGNOSIS — R7303 Prediabetes: Secondary | ICD-10-CM

## 2020-06-21 MED ORDER — TRAZODONE HCL 50 MG PO TABS: 25.0000 mg | ORAL_TABLET | Freq: Every evening | ORAL | 2 refills | Status: DC | PRN

## 2020-06-21 MED ORDER — METFORMIN HCL 500 MG PO TABS: 500.0000 mg | ORAL_TABLET | Freq: Every day | ORAL | 0 refills | Status: DC

## 2020-06-21 MED ORDER — PHENTERMINE HCL 37.5 MG PO TABS: 18.6000 mg | ORAL_TABLET | Freq: Every day | ORAL | 0 refills | Status: DC

## 2020-06-28 NOTE — Progress Notes (Signed)
Chief Complaint:   OBESITY Crystal Simmons is here to discuss her progress with her obesity treatment plan along with follow-up of her obesity related diagnoses.   Today's visit was #: 14 Starting weight: 211 lbs Starting date: 05/01/2019 Today's weight: 197 lbs Today's date: 06/21/2020 Weight change since last visit: +1 lb Total lbs lost to date: 14 lbs Body mass index is 29.95 kg/m.  Total weight loss percentage to date: -6.64%  Interim History:  On 3/28, Crystal Simmons's RMR was 1261 (from 1604), fat percentage 36.6, glucose 85.  She is drinking 72 ounces of water.  She had COVID in April.  She says she was scared to get out of the habit of exercising.  She endorses constipation and is taking Metamucil.  She will start MiraLAX.  Current Meal Plan: keeping a food journal and adhering to recommended goals of 1200-1500 calories and 95 grams of protein for 85% of the time.  Current Exercise Plan: Cardio/walking/strength training. Current Anti-Obesity Medications: phentermine 18.75 mg daily. Side effects: None.  Assessment/Plan:   Meds ordered this encounter  Medications  . metFORMIN (GLUCOPHAGE) 500 MG tablet    Sig: Take 1 tablet (500 mg total) by mouth daily.    Dispense:  30 tablet    Refill:  0  . traZODone (DESYREL) 50 MG tablet    Sig: Take 0.5-1 tablets (25-50 mg total) by mouth at bedtime as needed for sleep.    Dispense:  30 tablet    Refill:  2  . phentermine (ADIPEX-P) 37.5 MG tablet    Sig: Take 0.5 tablets (18.6 mg total) by mouth daily before breakfast.    Dispense:  15 tablet    Refill:  0    1. Prediabetes At goal. Goal is HgbA1c < 5.7.  Medication: metformin 500 mg daily.    Plan:  She will continue to focus on protein-rich, low simple carbohydrate foods. We reviewed the importance of hydration, regular exercise for stress reduction, and restorative sleep.   Lab Results  Component Value Date   HGBA1C 5.6 03/31/2020   Lab Results  Component Value Date   INSULIN  6.3 03/31/2020   INSULIN 5.1 12/11/2019   INSULIN 11.0 09/01/2019   INSULIN 8.9 05/01/2019   - Refill metFORMIN (GLUCOPHAGE) 500 MG tablet; Take 1 tablet (500 mg total) by mouth daily.  Dispense: 30 tablet; Refill: 0  2. Other insomnia This is well controlled with medication. Current treatment: trazodone 25-50 mg at bedtime as needed.  Plan: Recommend sleep hygiene measures including regular sleep schedule, optimal sleep environment, and relaxing presleep rituals.   - Refill traZODone (DESYREL) 50 MG tablet; Take 0.5-1 tablets (25-50 mg total) by mouth at bedtime as needed for sleep.  Dispense: 30 tablet; Refill: 2  3. Abnormal metabolism Declynn's metabolism is slower than normal.  Having again reminded the patient of the "off label" use of Phentermine beyond three consecutive months, and again discussing the risks, benefits, contraindications, and limitations of it's use; given it's role in the successful treatment of obesity thus far and lack of adverse effect, patient has expressed desire and given informed verbal consent to continue use.   - Refill phentermine (ADIPEX-P) 37.5 MG tablet; Take 0.5 tablets (18.6 mg total) by mouth daily before breakfast.  Dispense: 15 tablet; Refill: 0  I have consulted the [place] Controlled Substances Registry for this patient, and feel the risk/benefit ratio today is favorable for proceeding with this prescription for a controlled substance. The patient understands monitoring parameters and  red flags.   4. At risk for impaired metabolic function Due to [name]'s current state of health and medical condition(s), she is at a significantly higher risk for impaired metabolic function.   At least 9 minutes was spent on counseling Taia about these concerns today.  This places the patient at a much greater risk to subsequently develop cardio-pulmonary conditions that can negatively affect the patient's quality of life.  I stressed the importance of reversing these  risks factors.  5. Obesity, current BMI 30  Course: Crystal Simmons is currently in the action stage of change. As such, her goal is to continue with weight loss efforts.   Nutrition goals: She has agreed to keeping a food journal and adhering to recommended goals of 1200 calories and 95 grams of protein.   Exercise goals: For substantial health benefits, adults should do at least 150 minutes (2 hours and 30 minutes) a week of moderate-intensity, or 75 minutes (1 hour and 15 minutes) a week of vigorous-intensity aerobic physical activity, or an equivalent combination of moderate- and vigorous-intensity aerobic activity. Aerobic activity should be performed in episodes of at least 10 minutes, and preferably, it should be spread throughout the week.  Behavioral modification strategies: increasing lean protein intake, decreasing simple carbohydrates, increasing vegetables and increasing water intake.  [name] has agreed to follow-up with our clinic in 4 weeks. She was informed of the importance of frequent follow-up visits to maximize her success with intensive lifestyle modifications for her multiple health conditions.   Objective:   Blood pressure 123/76, pulse 80, temperature 98.4 F (36.9 C), temperature source Oral, height 5\' 8"  (1.727 m), weight 197 lb (89.4 kg), last menstrual period 10/14/2005, SpO2 96 %. Body mass index is 29.95 kg/m.  General: Cooperative, alert, well developed, in no acute distress. HEENT: Conjunctivae and lids unremarkable. Cardiovascular: Regular rhythm.  Lungs: Normal work of breathing. Neurologic: No focal deficits.   Lab Results  Component Value Date   CREATININE 0.92 03/31/2020   BUN 12 03/31/2020   NA 140 03/31/2020   K 4.3 03/31/2020   CL 102 03/31/2020   CO2 23 03/31/2020   Lab Results  Component Value Date   ALT 13 03/31/2020   AST 16 03/31/2020   ALKPHOS 83 03/31/2020   BILITOT 0.6 03/31/2020   Lab Results  Component Value Date   HGBA1C 5.6  03/31/2020   HGBA1C 5.8 (H) 12/11/2019   HGBA1C 5.9 (H) 09/01/2019   HGBA1C 5.8 (H) 05/01/2019   HGBA1C 5.4 12/13/2015   Lab Results  Component Value Date   INSULIN 6.3 03/31/2020   INSULIN 5.1 12/11/2019   INSULIN 11.0 09/01/2019   INSULIN 8.9 05/01/2019   Lab Results  Component Value Date   TSH 2.860 03/31/2020   Lab Results  Component Value Date   CHOL 167 03/31/2020   HDL 66 03/31/2020   LDLCALC 87 03/31/2020   TRIG 72 03/31/2020   CHOLHDL 2.5 03/31/2020   Lab Results  Component Value Date   WBC 6.6 03/31/2020   HGB 12.8 03/31/2020   HCT 38.6 03/31/2020   MCV 95 03/31/2020   PLT 300 03/31/2020   Lab Results  Component Value Date   IRON 93 03/31/2020   TIBC 264 03/31/2020   FERRITIN 65 03/31/2020   Attestation Statements:   Reviewed by clinician on day of visit: allergies, medications, problem list, medical history, surgical history, family history, social history, and previous encounter notes.  I, Water quality scientist, CMA, am acting as Location manager for [employer],  DO  I have reviewed the above documentation for accuracy and completeness, and I agree with the above. Briscoe Deutscher, DO

## 2020-07-09 ENCOUNTER — Encounter (INDEPENDENT_AMBULATORY_CARE_PROVIDER_SITE_OTHER): Payer: Self-pay | Admitting: Physician Assistant

## 2020-07-14 ENCOUNTER — Ambulatory Visit (INDEPENDENT_AMBULATORY_CARE_PROVIDER_SITE_OTHER): Payer: BC Managed Care – PPO | Admitting: Physician Assistant

## 2020-08-18 ENCOUNTER — Ambulatory Visit (INDEPENDENT_AMBULATORY_CARE_PROVIDER_SITE_OTHER): Payer: BC Managed Care – PPO | Admitting: Physician Assistant

## 2020-08-18 ENCOUNTER — Other Ambulatory Visit: Payer: Self-pay

## 2020-08-18 ENCOUNTER — Other Ambulatory Visit (INDEPENDENT_AMBULATORY_CARE_PROVIDER_SITE_OTHER): Payer: Self-pay | Admitting: Physician Assistant

## 2020-08-18 ENCOUNTER — Encounter (INDEPENDENT_AMBULATORY_CARE_PROVIDER_SITE_OTHER): Payer: Self-pay | Admitting: Physician Assistant

## 2020-08-18 VITALS — BP 117/81 | HR 89 | Temp 98.1°F | Ht 68.0 in | Wt 199.0 lb

## 2020-08-18 DIAGNOSIS — R7303 Prediabetes: Secondary | ICD-10-CM | POA: Diagnosis not present

## 2020-08-18 DIAGNOSIS — E559 Vitamin D deficiency, unspecified: Secondary | ICD-10-CM

## 2020-08-18 DIAGNOSIS — G4709 Other insomnia: Secondary | ICD-10-CM

## 2020-08-18 DIAGNOSIS — E669 Obesity, unspecified: Secondary | ICD-10-CM

## 2020-08-18 DIAGNOSIS — Z6831 Body mass index (BMI) 31.0-31.9, adult: Secondary | ICD-10-CM

## 2020-08-18 DIAGNOSIS — Z9189 Other specified personal risk factors, not elsewhere classified: Secondary | ICD-10-CM | POA: Diagnosis not present

## 2020-08-18 MED ORDER — ESZOPICLONE 1 MG PO TABS: 1.0000 mg | ORAL_TABLET | Freq: Every evening | ORAL | 0 refills | Status: DC | PRN

## 2020-08-18 MED ORDER — METFORMIN HCL 500 MG PO TABS: 500.0000 mg | ORAL_TABLET | Freq: Every day | ORAL | 0 refills | Status: DC

## 2020-08-18 NOTE — Telephone Encounter (Signed)
Pt last seen by Tracey Aguilar, PA-C.  

## 2020-08-19 ENCOUNTER — Encounter (INDEPENDENT_AMBULATORY_CARE_PROVIDER_SITE_OTHER): Payer: Self-pay | Admitting: Family Medicine

## 2020-08-19 LAB — COMPREHENSIVE METABOLIC PANEL
ALT: 19 IU/L (ref 0–32)
AST: 19 IU/L (ref 0–40)
Albumin/Globulin Ratio: 1.3 (ref 1.2–2.2)
Albumin: 4.1 g/dL (ref 3.8–4.9)
Alkaline Phosphatase: 100 IU/L (ref 44–121)
BUN/Creatinine Ratio: 14 (ref 12–28)
BUN: 10 mg/dL (ref 8–27)
Bilirubin Total: 0.5 mg/dL (ref 0.0–1.2)
CO2: 25 mmol/L (ref 20–29)
Calcium: 9.4 mg/dL (ref 8.7–10.3)
Chloride: 105 mmol/L (ref 96–106)
Creatinine, Ser: 0.74 mg/dL (ref 0.57–1.00)
Globulin, Total: 3.2 g/dL (ref 1.5–4.5)
Glucose: 95 mg/dL (ref 65–99)
Potassium: 4.7 mmol/L (ref 3.5–5.2)
Sodium: 142 mmol/L (ref 134–144)
Total Protein: 7.3 g/dL (ref 6.0–8.5)
eGFR: 93 mL/min/{1.73_m2} (ref >59)

## 2020-08-19 LAB — HEMOGLOBIN A1C
Est. average glucose Bld gHb Est-mCnc: 126 mg/dL
Hgb A1c MFr Bld: 6 % — ABNORMAL HIGH (ref 4.8–5.6)

## 2020-08-19 LAB — VITAMIN D 25 HYDROXY (VIT D DEFICIENCY, FRACTURES): Vit D, 25-Hydroxy: 49.1 ng/mL (ref 30.0–100.0)

## 2020-08-19 LAB — INSULIN, RANDOM: INSULIN: 13.1 u[IU]/mL (ref 2.6–24.9)

## 2020-08-23 NOTE — Telephone Encounter (Signed)
Please advise 

## 2020-08-23 NOTE — Telephone Encounter (Signed)
Hopefully she will be more willing to look at a GLP-1.

## 2020-08-24 NOTE — Progress Notes (Signed)
Chief Complaint:   OBESITY Crystal Simmons is here to discuss her progress with her obesity treatment plan along with follow-up of her obesity related diagnoses. Crystal Simmons is on keeping a food journal and adhering to recommended goals of 1200 calories and 95 grams of protein daily and states she is following her eating plan approximately 70% of the time. [name] states she is on the elliptical, doing YouTube cardio, and weights for 60 minutes 4 times per week.  Today's visit was #: 15 Starting weight: 211 lbs Starting date: 05/01/2019 Today's weight: 199 lbs Today's date: 08/18/2020 Total lbs lost to date: 12 Total lbs lost since last in-office visit: 0  Interim History: Infant reports that she is not sleeping well at night. She does not want to continue with trazodone as it gives her "crazy" dreams. She stopped phentermine 2 weeks ago due to tachycardia. She is sometimes skipping dinner, and she notes she got back on track yesterday.  Subjective:   1. Pre-diabetes Shandrell is on metformin, and she denies nausea, vomiting, or diarrhea.  2. Vitamin D deficiency Crystal Simmons is on 2,000 units of OTC Vit D daily.  3. Other insomnia Crystal Simmons notes trazodone gives her terrible dreams and wants to discontinue. She is having problems going to sleep and staying asleep.  4. At risk for diabetes mellitus Crystal Simmons is at higher than average risk for developing diabetes due to obesity.   Assessment/Plan:   1. Pre-diabetes Evolet will continue to work on weight loss, exercise, and decreasing simple carbohydrates to help decrease the risk of diabetes. We will recheck labs today, and we will refill metformin for 1 month.  - metFORMIN (GLUCOPHAGE) 500 MG tablet; Take 1 tablet (500 mg total) by mouth daily.  Dispense: 30 tablet; Refill: 0 - Comprehensive metabolic panel - Hemoglobin A1c - Insulin, random  2. Vitamin D deficiency Low Vitamin D level contributes to fatigue and are associated with obesity, breast,  and colon cancer. Crystal Simmons will continue with Vitamin D, and we will check labs today. She will follow-up for routine testing of Vitamin D, at least 2-3 times per year to avoid over-replacement.  - VITAMIN D 25 Hydroxy (Vit-D Deficiency, Fractures)  3. Other insomnia The problem of recurrent insomnia was discussed. Orders and follow up as documented in patient record. Counseling: Intensive lifestyle modifications are the first line treatment for this issue. We discussed several lifestyle modifications today. Crystal Simmons agreed to start Lunesta 1 mg qhs with no refills. She will follow up with her primary care physician. Will consider SSRI. She will continue to work on diet, exercise and weight loss efforts.   Counseling Limit or avoid alcohol, caffeinated beverages, and cigarettes, especially close to bedtime.  Do not eat a large meal or eat spicy foods right before bedtime. This can lead to digestive discomfort that can make it hard for you to sleep. Keep a sleep diary to help you and your health care provider figure out what could be causing your insomnia.  Make your bedroom a dark, comfortable place where it is easy to fall asleep. Put up shades or blackout curtains to block light from outside. Use a white noise machine to block noise. Keep the temperature cool. Limit screen use before bedtime. This includes: Watching TV. Using your smartphone, tablet, or computer. Stick to a routine that includes going to bed and waking up at the same times every day and night. This can help you fall asleep faster. Consider making a quiet activity, such as reading,  part of your nighttime routine. Try to avoid taking naps during the day so that you sleep better at night. Get out of bed if you are still awake after 15 minutes of trying to sleep. Keep the lights down, but try reading or doing a quiet activity. When you feel sleepy, go back to bed.   - eszopiclone (LUNESTA) 1 MG TABS tablet; Take 1 tablet (1 mg  total) by mouth at bedtime as needed for sleep. Take immediately before bedtime  Dispense: 30 tablet; Refill: 0  4. At risk for diabetes mellitus Crystal Simmons was given approximately 15 minutes of diabetes education and counseling today. We discussed intensive lifestyle modifications today with an emphasis on weight loss as well as increasing exercise and decreasing simple carbohydrates in her diet. We also reviewed medication options with an emphasis on risk versus benefit of those discussed.   Repetitive spaced learning was employed today to elicit superior memory formation and behavioral change.  5. Class 1 obesity with serious comorbidity and body mass index (BMI) of 31.0 to 31.9 in adult, unspecified obesity type Crystal Simmons is currently in the action stage of change. As such, her goal is to continue with weight loss efforts. She has agreed to keeping a food journal and adhering to recommended goals of 1200-1300 calories and 85 grams of protein daily.   Exercise goals: As is.  Behavioral modification strategies: meal planning and cooking strategies and keeping healthy foods in the home.  Crystal Simmons has agreed to follow-up with our clinic in 4 weeks. She was informed of the importance of frequent follow-up visits to maximize her success with intensive lifestyle modifications for her multiple health conditions.   Objective:   Blood pressure 117/81, pulse 89, temperature 98.1 F (36.7 C), height 5\' 8"  (1.727 m), weight 199 lb (90.3 kg), last menstrual period 10/14/2005, SpO2 99 %. Body mass index is 30.26 kg/m.  General: Cooperative, alert, well developed, in no acute distress. HEENT: Conjunctivae and lids unremarkable. Cardiovascular: Regular rhythm.  Lungs: Normal work of breathing. Neurologic: No focal deficits.   Lab Results  Component Value Date   CREATININE 0.74 08/18/2020   BUN 10 08/18/2020   NA 142 08/18/2020   K 4.7 08/18/2020   CL 105 08/18/2020   CO2 25 08/18/2020   Lab Results   Component Value Date   ALT 19 08/18/2020   AST 19 08/18/2020   ALKPHOS 100 08/18/2020   BILITOT 0.5 08/18/2020   Lab Results  Component Value Date   HGBA1C 6.0 (H) 08/18/2020   HGBA1C 5.6 03/31/2020   HGBA1C 5.8 (H) 12/11/2019   HGBA1C 5.9 (H) 09/01/2019   HGBA1C 5.8 (H) 05/01/2019   Lab Results  Component Value Date   INSULIN 13.1 08/18/2020   INSULIN 6.3 03/31/2020   INSULIN 5.1 12/11/2019   INSULIN 11.0 09/01/2019   INSULIN 8.9 05/01/2019   Lab Results  Component Value Date   TSH 2.860 03/31/2020   Lab Results  Component Value Date   CHOL 167 03/31/2020   HDL 66 03/31/2020   LDLCALC 87 03/31/2020   TRIG 72 03/31/2020   CHOLHDL 2.5 03/31/2020   Lab Results  Component Value Date   VD25OH 49.1 08/18/2020   VD25OH 49.6 03/31/2020   VD25OH 78.8 12/11/2019   Lab Results  Component Value Date   WBC 6.6 03/31/2020   HGB 12.8 03/31/2020   HCT 38.6 03/31/2020   MCV 95 03/31/2020   PLT 300 03/31/2020   Lab Results  Component Value Date  IRON 93 03/31/2020   TIBC 264 03/31/2020   FERRITIN 65 03/31/2020   Attestation Statements:   Reviewed by clinician on day of visit: allergies, medications, problem list, medical history, surgical history, family history, social history, and previous encounter notes.   Wilhemena Durie, am acting as transcriptionist for [employer], PA-C.  I have reviewed the above documentation for accuracy and completeness, and I agree with the above. Abby Potash, PA-C

## 2020-09-06 NOTE — Telephone Encounter (Signed)
Dr.Wallace °

## 2020-09-22 ENCOUNTER — Other Ambulatory Visit: Payer: Self-pay

## 2020-09-22 ENCOUNTER — Ambulatory Visit (INDEPENDENT_AMBULATORY_CARE_PROVIDER_SITE_OTHER): Payer: BC Managed Care – PPO | Admitting: Family Medicine

## 2020-09-22 ENCOUNTER — Encounter (INDEPENDENT_AMBULATORY_CARE_PROVIDER_SITE_OTHER): Payer: Self-pay | Admitting: Family Medicine

## 2020-09-22 VITALS — BP 115/77 | HR 91 | Temp 98.3°F | Ht 68.0 in | Wt 200.0 lb

## 2020-09-22 DIAGNOSIS — G4709 Other insomnia: Secondary | ICD-10-CM | POA: Diagnosis not present

## 2020-09-22 DIAGNOSIS — R7303 Prediabetes: Secondary | ICD-10-CM

## 2020-09-22 DIAGNOSIS — E669 Obesity, unspecified: Secondary | ICD-10-CM | POA: Diagnosis not present

## 2020-09-22 DIAGNOSIS — Z6832 Body mass index (BMI) 32.0-32.9, adult: Secondary | ICD-10-CM

## 2020-09-22 DIAGNOSIS — M7741 Metatarsalgia, right foot: Secondary | ICD-10-CM | POA: Diagnosis not present

## 2020-09-22 DIAGNOSIS — Z9189 Other specified personal risk factors, not elsewhere classified: Secondary | ICD-10-CM

## 2020-09-22 MED ORDER — ESZOPICLONE 1 MG PO TABS: 1.0000 mg | ORAL_TABLET | Freq: Every evening | ORAL | 0 refills | Status: DC | PRN

## 2020-09-22 MED ORDER — METFORMIN HCL 500 MG PO TABS: 500.0000 mg | ORAL_TABLET | Freq: Every day | ORAL | 0 refills | Status: DC

## 2020-09-28 NOTE — Progress Notes (Signed)
Chief Complaint:   OBESITY Crystal Simmons is here to discuss her progress with her obesity treatment plan along with follow-up of her obesity related diagnoses.   Today's visit was #: 68 Starting weight: 211 lbs Starting date: 05/01/2019 Today's weight: 200 lbs Today's date: 09/23/2019 Weight change since last visit: +1 lb Total lbs lost to date: 11 lbs Body mass index is 30.41 kg/m.  Total weight loss percentage to date: -5.21%  Current Meal Plan: keeping a food journal and adhering to recommended goals of 1200-1500 calories and 85 protein for 80% of the time.  Current Exercise Plan: Cardio for 30-60 minutes 5 times per week.  Interim History: Crystal Simmons stopped taking Metformin after her A1C went to 5.6. her A1C went back up to 5.8 and she restarted Metformin. She reports Crystal Simmons is working really well. She is getting 7 hours of sleep per night. She is also prepping her meals.  Assessment/Plan:   1. Metatarsalgia of right foot Crystal Simmons complains of pain in the ball of the foot. We will refer to Sports Medicine. This issue directly impacts care plan for optimization of BMI and metabolic health as it impacts the patient's ability to make lifestyle changes. We will continue to monitor symptoms as they relate to her weight loss journey.  - Ambulatory referral to Sports Medicine  2. Pre-diabetes Not at goal. Goal is HgbA1c < 5.7.  Medication: Restarted Metformin 500 mg daily.    Plan:  She will continue to focus on protein-rich, low simple carbohydrate foods. We reviewed the importance of hydration, regular exercise for stress reduction, and restorative sleep.   Lab Results  Component Value Date   HGBA1C 6.0 (H) 08/18/2020   Lab Results  Component Value Date   INSULIN 13.1 08/18/2020   INSULIN 6.3 03/31/2020   INSULIN 5.1 12/11/2019   INSULIN 11.0 09/01/2019   INSULIN 8.9 05/01/2019   - Refill metFORMIN (GLUCOPHAGE) 500 MG tablet; Take 1 tablet (500 mg total) by mouth daily.   Dispense: 30 tablet; Refill: 0  3. Other insomnia Crystal Simmons reports Crystal Simmons is working really well. She is getting 7 hours of sleep per night.  Plan: Continue Lunesta. Recommend sleep hygiene measures including regular sleep schedule, optimal sleep environment, and relaxing presleep rituals.   - Refill eszopiclone (LUNESTA) 1 MG TABS tablet; Take 1 tablet (1 mg total) by mouth at bedtime as needed for sleep. Take immediately before bedtime  Dispense: 30 tablet; Refill: 0  4. At risk for diabetes mellitus Crystal Simmons was given diabetes prevention education and counseling today of more than 9 minutes. During insulin resistance, several metabolic alterations induce the development of cardiovascular disease. For instance, insulin resistance can induce an imbalance in glucose metabolism that generates chronic hyperglycemia, which in turn triggers oxidative stress and causes an inflammatory response that leads to cell damage. Insulin resistance can also alter systemic lipid metabolism which then leads to the development of dyslipidemia and the well-known lipid triad: (1) high levels of plasma triglycerides, (2) low levels of high-density lipoprotein, and (3) the appearance of small dense low-density lipoproteins. This triad, along with endothelial dysfunction, which can also be induced by aberrant insulin signaling, contribute to atherosclerotic plaque formation.   5. Obesity, current BMI 30.4 Course: Crystal Simmons is currently in the action stage of change. As such, her goal is to continue with weight loss efforts.   Nutrition goals: She has agreed to keeping a food journal and adhering to recommended goals of 1200-1300 calories and 85 grams of protein.  Exercise goals: As is.  Behavioral modification strategies: increasing lean protein intake and decreasing simple carbohydrates.  Crystal Simmons has agreed to follow-up with our clinic in 4 weeks. She was informed of the importance of frequent follow-up visits to maximize  her success with intensive lifestyle modifications for her multiple health conditions.   Objective:   Blood pressure 115/77, pulse 91, temperature 98.3 F (36.8 C), height '5\' 8"'$  (1.727 m), weight 200 lb (90.7 kg), last menstrual period 10/14/2005, SpO2 97 %. Body mass index is 30.41 kg/m.  General: Cooperative, alert, well developed, in no acute distress. HEENT: Conjunctivae and lids unremarkable. Cardiovascular: Regular rhythm.  Lungs: Normal work of breathing. Neurologic: No focal deficits.   Lab Results  Component Value Date   CREATININE 0.74 08/18/2020   BUN 10 08/18/2020   NA 142 08/18/2020   K 4.7 08/18/2020   CL 105 08/18/2020   CO2 25 08/18/2020   Lab Results  Component Value Date   ALT 19 08/18/2020   AST 19 08/18/2020   ALKPHOS 100 08/18/2020   BILITOT 0.5 08/18/2020   Lab Results  Component Value Date   HGBA1C 6.0 (H) 08/18/2020   HGBA1C 5.6 03/31/2020   HGBA1C 5.8 (H) 12/11/2019   HGBA1C 5.9 (H) 09/01/2019   HGBA1C 5.8 (H) 05/01/2019   Lab Results  Component Value Date   INSULIN 13.1 08/18/2020   INSULIN 6.3 03/31/2020   INSULIN 5.1 12/11/2019   INSULIN 11.0 09/01/2019   INSULIN 8.9 05/01/2019   Lab Results  Component Value Date   TSH 2.860 03/31/2020   Lab Results  Component Value Date   CHOL 167 03/31/2020   HDL 66 03/31/2020   LDLCALC 87 03/31/2020   TRIG 72 03/31/2020   CHOLHDL 2.5 03/31/2020   Lab Results  Component Value Date   VD25OH 49.1 08/18/2020   VD25OH 49.6 03/31/2020   VD25OH 78.8 12/11/2019   Lab Results  Component Value Date   WBC 6.6 03/31/2020   HGB 12.8 03/31/2020   HCT 38.6 03/31/2020   MCV 95 03/31/2020   PLT 300 03/31/2020   Lab Results  Component Value Date   IRON 93 03/31/2020   TIBC 264 03/31/2020   FERRITIN 65 03/31/2020   Attestation Statements:   Reviewed by clinician on day of visit: allergies, medications, problem list, medical history, surgical history, family history, social history, and  previous encounter notes.  Leodis Binet Friedenbach, CMA, am acting as Location manager for [employer], DO.  I have reviewed the above documentation for accuracy and completeness, and I agree with the above. Briscoe Deutscher, DO

## 2020-10-05 ENCOUNTER — Other Ambulatory Visit: Payer: Self-pay

## 2020-10-05 ENCOUNTER — Ambulatory Visit: Payer: BC Managed Care – PPO | Admitting: Family Medicine

## 2020-10-05 VITALS — BP 148/98 | HR 77 | Ht 68.0 in | Wt 208.8 lb

## 2020-10-05 DIAGNOSIS — M79674 Pain in right toe(s): Secondary | ICD-10-CM

## 2020-10-05 NOTE — Progress Notes (Signed)
   I, Peterson Lombard, LAT, ATC acting as a scribe for Lynne Leader, MD.  Subjective:    I'm seeing this patient as a consultation for: Dr. Briscoe Deutscher. Note will be routed back to referring provider/PCP.  CC: R foot pain  HPI: Pt is a 60 y/o female c/o R foot pain x about a year. Pt locates pain to the head of the R 1st MT. Of note pt was previously seen by podiatry on 11/26/19 for plantar fascitis of the R foot. Pt reports intermittent numbness/tingling in foot.  R foot swelling: yes- sometimes Aggravates: working out, Treatments tried: wearing flat shoes only, pad, stretching, dancer pads  Dx imaging: 11/26/19 R foot XR  Past medical history, Surgical history, Family history, Social history, Allergies, and medications have been entered into the medical record, reviewed. History of right bunion surgery  Review of Systems: No new headache, visual changes, nausea, vomiting, diarrhea, constipation, dizziness, abdominal pain, skin rash, fevers, chills, night sweats, weight loss, swollen lymph nodes, body aches, joint swelling, muscle aches, chest pain, shortness of breath, mood changes, visual or auditory hallucinations.   Objective:    Vitals:   10/05/20 1547  BP: (!) 148/98  Pulse: 77  SpO2: 94%   General: Well Developed, well nourished, and in no acute distress.  Neuro/Psych: Alert and oriented x3, extra-ocular muscles intact, able to move all 4 extremities, sensation grossly intact. Skin: Warm and dry, no rashes noted.  Respiratory: Not using accessory muscles, speaking in full sentences, trachea midline.  Cardiovascular: Pulses palpable, no extremity edema. Abdomen: Does not appear distended. MSK: Right foot mature scar at right first MTP otherwise normal-appearing Decreased motion first MTP with decreased motion of dorsiflexion and plantarflexion. Mildly tender palpation plantar first MTP. Pulses capillary fill and sensation intact distally.  Lab and Radiology  Results  X-ray images right foot obtained on November 26, 2019 personally and independently interpreted. Intact surgical hardware.  Mild degenerative changes first MTP.   Impression and Recommendations:    Assessment and Plan: 60 y.o. 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  Plan for first ray float as initial treatment along with Voltaren gel.  If not improving next step would be turf toe insole and possibly injection.  I showed her how to make a first ray float and she felt better immediately with that, which makes me optimistic that we are on the right path.  Recheck in about 6 weeks.  Discussed warning signs or symptoms. Please see discharge instructions. Patient expresses understanding.   The above documentation has been reviewed and is accurate and complete Lynne Leader, M.D.

## 2020-10-05 NOTE — Patient Instructions (Signed)
Thank you for coming in today.   Please use Voltaren gel (Generic Diclofenac Gel) up to 4x daily for pain as needed.  This is available over-the-counter as both the name brand Voltaren gel and the generic diclofenac gel.   Try making the metatarsal float thing.   Lucio Edward [id number] Self Adhesive Foam Sheets - Pack of 20, Class Pack of [employer] for [employer] and [employer], [place] for [employer], [employer] or [employer]  If needed we can use turf toe insoles and do an injection.   Recheck in about 6 weeks.  Let me know if this is not working.

## 2020-11-01 ENCOUNTER — Ambulatory Visit (INDEPENDENT_AMBULATORY_CARE_PROVIDER_SITE_OTHER): Payer: BC Managed Care – PPO | Admitting: Family Medicine

## 2020-11-10 ENCOUNTER — Ambulatory Visit (INDEPENDENT_AMBULATORY_CARE_PROVIDER_SITE_OTHER): Payer: BC Managed Care – PPO | Admitting: Physician Assistant

## 2020-11-10 ENCOUNTER — Other Ambulatory Visit: Payer: Self-pay

## 2020-11-10 ENCOUNTER — Encounter (INDEPENDENT_AMBULATORY_CARE_PROVIDER_SITE_OTHER): Payer: Self-pay | Admitting: Physician Assistant

## 2020-11-10 ENCOUNTER — Other Ambulatory Visit (INDEPENDENT_AMBULATORY_CARE_PROVIDER_SITE_OTHER): Payer: Self-pay | Admitting: Physician Assistant

## 2020-11-10 VITALS — BP 134/82 | HR 65 | Temp 98.1°F | Ht 68.0 in | Wt 202.0 lb

## 2020-11-10 DIAGNOSIS — E669 Obesity, unspecified: Secondary | ICD-10-CM | POA: Diagnosis not present

## 2020-11-10 DIAGNOSIS — Z6832 Body mass index (BMI) 32.0-32.9, adult: Secondary | ICD-10-CM

## 2020-11-10 DIAGNOSIS — Z9189 Other specified personal risk factors, not elsewhere classified: Secondary | ICD-10-CM

## 2020-11-10 DIAGNOSIS — R7303 Prediabetes: Secondary | ICD-10-CM | POA: Diagnosis not present

## 2020-11-10 DIAGNOSIS — G4709 Other insomnia: Secondary | ICD-10-CM | POA: Diagnosis not present

## 2020-11-10 MED ORDER — METFORMIN HCL 500 MG PO TABS: 500.0000 mg | ORAL_TABLET | Freq: Every day | ORAL | 0 refills | Status: DC

## 2020-11-10 NOTE — Progress Notes (Signed)
Chief Complaint:   OBESITY Crystal Simmons is here to discuss her progress with her obesity treatment plan along with follow-up of her obesity related diagnoses. Crystal Simmons is on keeping a food journal and adhering to recommended goals of 1200-1300 calories and 85 grams of protein daily and states she is following her eating plan approximately 80% of the time. [name] states she is doing cardio for 60 minutes 4 times per week.  Today's visit was #: 24 Starting weight: 211 lbs Starting date: 05/01/2019 Today's weight: 202 lbs Today's date: 11/10/2020 Total lbs lost to date: 9 Total lbs lost since last in-office visit: 0  Interim History: Crystal Simmons was on 2 vacations since her last visit and she did not completely stay on the plan. She restarted journaling 2 days ago and she is ready to stay on track.  Subjective:   1. Pre-diabetes [name]'s last A1c was 6.0. She is taking metformin as prescribed.  2. Other insomnia [name] notes Lunesta 1 mg is working well.   3. At risk for diabetes mellitus [name] is at higher than average risk for developing diabetes due to obesity.   Assessment/Plan:   1. Pre-diabetes Crystal Simmons will continue to work on weight loss, exercise, and decreasing simple carbohydrates to help decrease the risk of diabetes. We will refill metformin for 1 month.  - metFORMIN (GLUCOPHAGE) 500 MG tablet; Take 1 tablet (500 mg total) by mouth daily.  Dispense: 30 tablet; Refill: 0  2. Other insomnia The problem of recurrent insomnia was discussed. Orders and follow up as documented in patient record. Counseling: Intensive lifestyle modifications are the first line treatment for this issue. We discussed several lifestyle modifications today. We will refill Lunesta 1 mg for 1 month. Crystal Simmons will continue to work on diet, exercise and weight loss efforts.   Counseling Limit or avoid alcohol, caffeinated beverages, and cigarettes, especially close to bedtime.  Do not eat a large meal or eat  spicy foods right before bedtime. This can lead to digestive discomfort that can make it hard for you to sleep. Keep a sleep diary to help you and your health care provider figure out what could be causing your insomnia.  Make your bedroom a dark, comfortable place where it is easy to fall asleep. Put up shades or blackout curtains to block light from outside. Use a white noise machine to block noise. Keep the temperature cool. Limit screen use before bedtime. This includes: Watching TV. Using your smartphone, tablet, or computer. Stick to a routine that includes going to bed and waking up at the same times every day and night. This can help you fall asleep faster. Consider making a quiet activity, such as reading, part of your nighttime routine. Try to avoid taking naps during the day so that you sleep better at night. Get out of bed if you are still awake after 15 minutes of trying to sleep. Keep the lights down, but try reading or doing a quiet activity. When you feel sleepy, go back to bed.  3. At risk for diabetes mellitus Crystal Simmons was given approximately 15 minutes of diabetes education and counseling today. We discussed intensive lifestyle modifications today with an emphasis on weight loss as well as increasing exercise and decreasing simple carbohydrates in her diet. We also reviewed medication options with an emphasis on risk versus benefit of those discussed.   Repetitive spaced learning was employed today to elicit superior memory formation and behavioral change.  4. Obesity, current BMI 30.72 Crystal Simmons is  currently in the action stage of change. As such, her goal is to continue with weight loss efforts. She has agreed to keeping a food journal and adhering to recommended goals of 1200-1300 calories and 85 grams of protein daily.   We will recheck fasting labs at her next visit.  Exercise goals: As is.  Behavioral modification strategies: meal planning and cooking strategies and  planning for success.  Crystal Simmons has agreed to follow-up with our clinic in 4 weeks. She was informed of the importance of frequent follow-up visits to maximize her success with intensive lifestyle modifications for her multiple health conditions.   Objective:   Blood pressure 134/82, pulse 65, temperature 98.1 F (36.7 C), height 5\' 8"  (1.727 m), weight 202 lb (91.6 kg), last menstrual period 10/14/2005, SpO2 96 %. Body mass index is 30.71 kg/m.  General: Cooperative, alert, well developed, in no acute distress. HEENT: Conjunctivae and lids unremarkable. Cardiovascular: Regular rhythm.  Lungs: Normal work of breathing. Neurologic: No focal deficits.   Lab Results  Component Value Date   CREATININE 0.74 08/18/2020   BUN 10 08/18/2020   NA 142 08/18/2020   K 4.7 08/18/2020   CL 105 08/18/2020   CO2 25 08/18/2020   Lab Results  Component Value Date   ALT 19 08/18/2020   AST 19 08/18/2020   ALKPHOS 100 08/18/2020   BILITOT 0.5 08/18/2020   Lab Results  Component Value Date   HGBA1C 6.0 (H) 08/18/2020   HGBA1C 5.6 03/31/2020   HGBA1C 5.8 (H) 12/11/2019   HGBA1C 5.9 (H) 09/01/2019   HGBA1C 5.8 (H) 05/01/2019   Lab Results  Component Value Date   INSULIN 13.1 08/18/2020   INSULIN 6.3 03/31/2020   INSULIN 5.1 12/11/2019   INSULIN 11.0 09/01/2019   INSULIN 8.9 05/01/2019   Lab Results  Component Value Date   TSH 2.860 03/31/2020   Lab Results  Component Value Date   CHOL 167 03/31/2020   HDL 66 03/31/2020   LDLCALC 87 03/31/2020   TRIG 72 03/31/2020   CHOLHDL 2.5 03/31/2020   Lab Results  Component Value Date   VD25OH 49.1 08/18/2020   VD25OH 49.6 03/31/2020   VD25OH 78.8 12/11/2019   Lab Results  Component Value Date   WBC 6.6 03/31/2020   HGB 12.8 03/31/2020   HCT 38.6 03/31/2020   MCV 95 03/31/2020   PLT 300 03/31/2020   Lab Results  Component Value Date   IRON 93 03/31/2020   TIBC 264 03/31/2020   FERRITIN 65 03/31/2020   Attestation  Statements:   Reviewed by clinician on day of visit: allergies, medications, problem list, medical history, surgical history, family history, social history, and previous encounter notes.   Wilhemena Durie, am acting as transcriptionist for [employer], PA-C.  I have reviewed the above documentation for accuracy and completeness, and I agree with the above. Abby Potash, PA-C

## 2020-11-16 ENCOUNTER — Encounter (INDEPENDENT_AMBULATORY_CARE_PROVIDER_SITE_OTHER): Payer: Self-pay | Admitting: Family Medicine

## 2020-11-16 ENCOUNTER — Ambulatory Visit: Payer: BC Managed Care – PPO | Admitting: Family Medicine

## 2020-11-17 ENCOUNTER — Other Ambulatory Visit (INDEPENDENT_AMBULATORY_CARE_PROVIDER_SITE_OTHER): Payer: Self-pay | Admitting: Physician Assistant

## 2020-11-17 ENCOUNTER — Other Ambulatory Visit (INDEPENDENT_AMBULATORY_CARE_PROVIDER_SITE_OTHER): Payer: Self-pay

## 2020-11-17 DIAGNOSIS — R7303 Prediabetes: Secondary | ICD-10-CM

## 2020-11-17 MED ORDER — METFORMIN HCL 500 MG PO TABS: 500.0000 mg | ORAL_TABLET | Freq: Every day | ORAL | 0 refills | Status: DC

## 2020-11-17 NOTE — Telephone Encounter (Signed)
I see this in her 11/10/2020 not but discontinued. Please advise.

## 2020-11-17 NOTE — Telephone Encounter (Signed)
She should still be on metformin for now. Thanks

## 2020-11-17 NOTE — Telephone Encounter (Signed)
Last OV with Crystal Simmons 

## 2020-11-26 DIAGNOSIS — Z23 Encounter for immunization: Secondary | ICD-10-CM | POA: Diagnosis not present

## 2020-11-26 DIAGNOSIS — R002 Palpitations: Secondary | ICD-10-CM | POA: Diagnosis not present

## 2020-12-15 ENCOUNTER — Ambulatory Visit (INDEPENDENT_AMBULATORY_CARE_PROVIDER_SITE_OTHER): Payer: BC Managed Care – PPO | Admitting: Family Medicine

## 2021-01-03 ENCOUNTER — Encounter: Payer: Self-pay | Admitting: Cardiology

## 2021-01-03 ENCOUNTER — Ambulatory Visit: Payer: BC Managed Care – PPO | Admitting: Cardiology

## 2021-01-03 ENCOUNTER — Ambulatory Visit (INDEPENDENT_AMBULATORY_CARE_PROVIDER_SITE_OTHER): Payer: BC Managed Care – PPO

## 2021-01-03 ENCOUNTER — Other Ambulatory Visit: Payer: Self-pay

## 2021-01-03 VITALS — BP 130/92 | HR 73 | Ht 68.0 in | Wt 214.0 lb

## 2021-01-03 DIAGNOSIS — E669 Obesity, unspecified: Secondary | ICD-10-CM

## 2021-01-03 DIAGNOSIS — R0602 Shortness of breath: Secondary | ICD-10-CM

## 2021-01-03 DIAGNOSIS — R002 Palpitations: Secondary | ICD-10-CM

## 2021-01-03 DIAGNOSIS — R7303 Prediabetes: Secondary | ICD-10-CM

## 2021-01-03 DIAGNOSIS — I493 Ventricular premature depolarization: Secondary | ICD-10-CM | POA: Diagnosis not present

## 2021-01-03 NOTE — Progress Notes (Signed)
Cardiology Office Note:    Date:  01/03/2021   ID:  Crystal Simmons, DOB 06-Dec-1960, MRN [id number]  PCP:  Denita Lung, MD  Cardiologist:  Berniece Salines, DO  Electrophysiologist:  None   Referring MD: Kathyrn Lass, MD   " I have been having palpitations"  History of Present Illness:    Crystal Simmons is a 60 y.o. female with a hx of vitamin D deficiency, GERD, and prediabetes presents to be evaluated for palpitations and shortness of breath.  Patient tells me for the last month she has experienced that her heart has been racing.  She described as an abrupt onset of fast heartbeat which goes for minutes at a time and then resolved.  During those times she is short of breath and feel lightheaded but has not had any syncope episode.  Recently she did see her PCP who gave her as needed Lopressor 25 mg twice a day.  She has not needed that medication because since her last episode she has not had any significant palpitations.  But she does carry it with her. No chest pain. 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   Insomnia    Joint pain    Lactose intolerance    Menopausal state 2007   Seasonal allergies    Shingles 04/2018   STD (sexually transmitted disease) 8/09   chlamydia   Vitamin D deficiency disease 2010    Past Surgical History:  Procedure Laterality Date   BREAST SURGERY Left 6/03   Biopsy negative   CERVIX LESION DESTRUCTION  4/92   COLONOSCOPY W/ BIOPSIES  09/26/10   polyp benign   COLPOSCOPY  10/12   CIN I   KNEE SURGERY  12/2009   left for meniscal tear   LAPAROSCOPY  7/02   multiple surgeries, including LSO, LOA, and endometrioma, and fibroids secondary to endometriosis   UMBILICAL EXPLORATION  4/13   for endometrioma     Current  Medications: Current Meds  Medication Sig   Ascorbic Acid (VITAMIN C PO) Take by mouth.   cetirizine (ZYRTEC) 10 MG tablet Take 10 mg by mouth daily.   Cholecalciferol (VITAMIN D3) 50 MCG (2000 UT) TABS Take by mouth.   ELDERBERRY PO Take by mouth. + zinc   eszopiclone (LUNESTA) 1 MG TABS tablet Take 1 tablet (1 mg total) by mouth at bedtime as needed for sleep.   fish oil-omega-3 fatty acids 1000 MG capsule Take 2 g by mouth daily.   fluticasone (FLONASE) 50 MCG/ACT nasal spray Place 2 sprays into the nose daily.   metFORMIN (GLUCOPHAGE) 500 MG tablet Take 1 tablet (500 mg total) by mouth daily with breakfast.   metoprolol tartrate (LOPRESSOR) 25 MG tablet Take 25 mg by mouth as needed. Patient states that she takes this medication as needed for heart palpitations. Per her primary care provider.   Multiple Vitamins-Minerals (MULTIVITAMIN WITH MINERALS) tablet Take 1 tablet by mouth daily.   PREBIOTIC PRODUCT PO Take by mouth.     Allergies:   Sulfa antibiotics and Codeine   Social History   Socioeconomic History   Marital status: Single    Spouse name: Not on file   Number of children: 0  Years of education: Not on file   Highest education level: Not on file  Occupational History   Occupation: COLLECTION [employer]    Employer: [employer]  Tobacco Use   Smoking status: Never   Smokeless tobacco: Never  Vaping Use   Vaping Use: Never used  Substance and Sexual Activity   Alcohol use: Yes    Comment: 2-3 times a month   Drug use: No   Sexual activity: Yes    Partners: Male    Birth control/protection: Post-menopausal, None  Other Topics Concern   Not on file  Social History Narrative   Not on file   Social Determinants of Health   Financial Resource Strain: Not on file  Food Insecurity: Not on file  Transportation Needs: Not on file  Physical Activity: Not on file  Stress: Not on file  Social Connections: Not on file     Family History: The patient's  family history includes Diabetes in her brother, father, and paternal grandfather; Heart disease in her father; Hypertension in her father and mother; Sudden death in her father.  ROS:   Review of Systems  Constitution: Negative for decreased appetite, fever and weight gain.  HENT: Negative for congestion, ear discharge, hoarse voice and sore throat.   Eyes: Negative for discharge, redness, vision loss in right eye and visual halos.  Cardiovascular: Reports palpitations and shortness of breath.  Negative for chest pain,  leg swelling, orthopnea Respiratory: Negative for cough, hemoptysis, shortness of breath and snoring.   Endocrine: Negative for heat intolerance and polyphagia.  Hematologic/Lymphatic: Negative for bleeding problem. Does not bruise/bleed easily.  Skin: Negative for flushing, nail changes, rash and suspicious lesions.  Musculoskeletal: Negative for arthritis, joint pain, muscle cramps, myalgias, neck pain and stiffness.  Gastrointestinal: Negative for abdominal pain, bowel incontinence, diarrhea and excessive appetite.  Genitourinary: Negative for decreased libido, genital sores and incomplete emptying.  Neurological: Negative for brief paralysis, focal weakness, headaches and loss of balance.  Psychiatric/Behavioral: Negative for altered mental status, depression and suicidal ideas.  Allergic/Immunologic: Negative for HIV exposure and persistent infections.    EKGs/Labs/Other Studies Reviewed:    The following studies were reviewed today:   EKG:  The ekg ordered today demonstrates sinus rhythm, heart rate 70 bpm with occasional PVCs.  Recent Labs: 03/31/2020: Hemoglobin 12.8; Platelets 300; TSH 2.860 08/18/2020: ALT 19; BUN 10; Creatinine, Ser 0.74; Potassium 4.7; Sodium 142  Recent Lipid Panel    Component Value Date/Time   CHOL 167 03/31/2020 0758   TRIG 72 03/31/2020 0758   HDL 66 03/31/2020 0758   CHOLHDL 2.5 03/31/2020 0758   CHOLHDL 2.7 12/13/2015 1104   VLDL  11 12/13/2015 1104   LDLCALC 87 03/31/2020 0758    Physical Exam:    VS:  BP (!) 130/92 (BP Location: Right Arm)   Pulse 73   Ht 5\' 8"  (1.727 m)   Wt 214 lb (97.1 kg)   LMP 10/14/2005 (Approximate)   SpO2 98%   BMI 32.54 kg/m     Wt Readings from Last 3 Encounters:  01/03/21 214 lb (97.1 kg)  11/10/20 202 lb (91.6 kg)  10/05/20 208 lb 12.8 oz (94.7 kg)     GEN: Well nourished, well developed in no acute distress HEENT: Normal NECK: No JVD; No carotid bruits LYMPHATICS: No lymphadenopathy CARDIAC: S1S2 noted,RRR, no murmurs, rubs, gallops RESPIRATORY:  Clear to auscultation without rales, wheezing or rhonchi  ABDOMEN: Soft, non-tender, non-distended, +bowel sounds, no guarding. EXTREMITIES: No edema,  No cyanosis, no clubbing MUSCULOSKELETAL:  No deformity  SKIN: Warm and dry NEUROLOGIC:  Alert and oriented x 3, non-focal PSYCHIATRIC:  Normal affect, good insight  ASSESSMENT:    1. Palpitations   2. PVC (premature ventricular contraction)   3. SOB (shortness of breath)   4. Prediabetes   5. Obesity (BMI 30.0-34.9)    PLAN:     I would like to rule out a cardiovascular etiology of this palpitation, therefore at this time I would like to placed a zio patch for 14  days. In additon she has been experiencing some shortness of breath therefore a transthoracic echocardiogram will be ordered to assess LV/RV function and any structural abnormalities. Once these testing have been performed amd reviewed further reccomendations will be made.   Prediabetes-she has been started on metformin.  Recent hemoglobin A1c was 6.  The patient understands the need to lose weight with diet and exercise. We have discussed specific strategies for this.  The patient is in agreement with the above plan. The patient left the office in stable condition.  The patient will follow up in 1 year or sooner if needed.   Medication Adjustments/Labs and Tests Ordered: Current medicines are reviewed at  length with the patient today.  Concerns regarding medicines are outlined above.  Orders Placed This Encounter  Procedures   LONG TERM MONITOR (3-14 DAYS)   EKG 12-Lead   ECHOCARDIOGRAM COMPLETE    No orders of the defined types were placed in this encounter.   Patient Instructions  Medication Instructions:  Your physician recommends that you continue on your current medications as directed. Please refer to the Current Medication list given to you today.  *If you need a refill on your cardiac medications before your next appointment, please call your pharmacy*   Lab Work: None If you have labs (blood work) drawn today and your tests are completely normal, you will receive your results only by: [place] (if you have MyChart) OR A paper copy in the mail If you have any lab test that is abnormal or we need to change your treatment, we will call you to review the results.   Testing/Procedures: Bryn Gull[facility]- Long Term Monitor Instructions  Your physician has requested you wear a ZIO patch monitor for 14 days.  This is a single patch monitor. [employer] supplies one patch monitor per enrollment. Additional stickers are not available. Please do not apply patch if you will be having a Nuclear Stress Test,  Echocardiogram, Cardiac CT, MRI, or Chest Xray during the period you would be wearing the  monitor. The patch cannot be worn during these tests. You cannot remove and re-apply the  ZIO XT patch monitor.  Your ZIO patch monitor will be mailed 3 day USPS to your address on file. It may take 3-5 days  to receive your monitor after you have been enrolled.  Once you have received your monitor, please review the enclosed instructions. Your monitor  has already been registered assigning a specific monitor serial # to you.  Billing and Patient Assistance Program Information  We have supplied [employer] with any of your insurance information on file for billing purposes. [employer] offers a  sliding scale Patient Assistance Program for patients that do not have  insurance, or whose insurance does not completely cover the cost of the ZIO monitor.  You must apply for the Patient Assistance Program to qualify for this discounted rate.  To apply, please call [employer] at [phone], select option 4,  select option 2, ask to apply for  Patient Assistance Program. Theodore Demark will ask your household income, and how many people  are in your household. They will quote your out-of-pocket cost based on that information.  [employer] will also be able to set up a 22-month, interest-free payment plan if needed.  Applying the monitor   Shave hair from upper left chest.  Hold abrader disc by orange tab. Rub abrader in 40 strokes over the upper left chest as  indicated in your monitor instructions.  Clean area with 4 enclosed alcohol pads. Let dry.  Apply patch as indicated in monitor instructions. Patch will be placed under collarbone on left  side of chest with arrow pointing upward.  Rub patch adhesive wings for 2 minutes. Remove white label marked "1". Remove the white  label marked "2". Rub patch adhesive wings for 2 additional minutes.  While looking in a mirror, press and release button in center of patch. A small green light will  flash 3-4 times. This will be your only indicator that the monitor has been turned on.  Do not shower for the first 24 hours. You may shower after the first 24 hours.  Press the button if you feel a symptom. You will hear a small click. Record Date, Time and  Symptom in the Patient Logbook.  When you are ready to remove the patch, follow instructions on the last 2 pages of Patient  Logbook. Stick patch monitor onto the last page of Patient Logbook.  Place Patient Logbook in the blue and white box. Use locking tab on box and tape box closed  securely. The blue and white box has prepaid postage on it. Please place it in the mailbox as  soon as possible. Your physician  should have your test results approximately 7 days after the  monitor has been mailed back to [facility].  Call [place] at [phone] if you have questions regarding  your ZIO XT patch monitor. Call them immediately if you see an orange light blinking on your  monitor.  If your monitor falls off in less than 4 days, contact our Monitor department at [phone].  If your monitor becomes loose or falls off after 4 days call [employer] at [phone] for  suggestions on securing your monitor  Your physician has requested that you have an echocardiogram. Echocardiography is a painless test that uses sound waves to create images of your heart. It provides your doctor with information about the size and shape of your heart and how well your heart's chambers and valves are working. This procedure takes approximately one hour. There are no restrictions for this procedure.    Follow-Up: At [facility], you and your health needs are our priority.  As part of our continuing mission to provide you with exceptional heart care, we have created designated Provider Care Teams.  These Care Teams include your primary Cardiologist (physician) and Advanced Practice Providers (APPs -  Physician Assistants and Nurse Practitioners) who all work together to provide you with the care you need, when you need it.  We recommend signing up for the patient portal called "MyChart".  Sign up information is provided on this After Visit Summary.  MyChart is used to connect with patients for Virtual Visits (Telemedicine).  Patients are able to view lab/test results, encounter notes, upcoming appointments, etc.  Non-urgent messages can be sent to your provider as well.   To learn more about what you can do with MyChart, go  to [email or web address].    Your next appointment:   1 year(s)  The format for your next appointment:   In Person  Provider:   Berniece Salines, DO   Other Instructions      Adopting a Healthy Lifestyle.  Know what a healthy weight is for you (roughly BMI <25) and aim to maintain this   Aim for 7+ servings of fruits and vegetables daily   65-80+ fluid ounces of water or unsweet tea for healthy kidneys   Limit to max 1 drink of alcohol per day; avoid smoking/tobacco   Limit animal fats in diet for cholesterol and heart health - choose grass fed whenever available   Avoid highly processed foods, and foods high in saturated/trans fats   Aim for low stress - take time to unwind and care for your mental health   Aim for 150 min of moderate intensity exercise weekly for heart health, and weights twice weekly for bone health   Aim for 7-9 hours of sleep daily   When it comes to diets, agreement about the perfect plan isnt easy to find, even among the experts. Experts at the [place] developed an idea known as the Healthy Eating Plate. Just imagine a plate divided into logical, healthy portions.   The emphasis is on diet quality:   Load up on vegetables and fruits - one-half of your plate: Aim for color and variety, and remember that potatoes dont count.   Go for whole grains - one-quarter of your plate: Whole wheat, barley, wheat berries, quinoa, oats, brown rice, and foods made with them. If you want pasta, go with whole wheat pasta.   Protein power - one-quarter of your plate: Fish, chicken, beans, and nuts are all healthy, versatile protein sources. Limit red meat.   The diet, however, does go beyond the plate, offering a few other suggestions.   Use healthy plant oils, such as olive, canola, soy, corn, sunflower and peanut. Check the labels, and avoid partially hydrogenated oil, which have unhealthy trans fats.   If youre thirsty, drink water. Coffee and tea are good in moderation, but skip sugary drinks and limit milk and dairy products to one or two daily servings.   The type of carbohydrate in the diet is more important  than the amount. Some sources of carbohydrates, such as vegetables, fruits, whole grains, and beans-are healthier than others.   Finally, stay active  Signed, Berniece Salines, DO  01/03/2021 5:24 PM    [place]

## 2021-01-03 NOTE — Patient Instructions (Addendum)
Medication Instructions:  Your physician recommends that you continue on your current medications as directed. Please refer to the Current Medication list given to you today.  *If you need a refill on your cardiac medications before your next appointment, please call your pharmacy*   Lab Work: None If you have labs (blood work) drawn today and your tests are completely normal, you will receive your results only by: [place] (if you have MyChart) OR A paper copy in the mail If you have any lab test that is abnormal or we need to change your treatment, we will call you to review the results.   Testing/Procedures: Bryn Gulling- Long Term Monitor Instructions  Your physician has requested you wear a ZIO patch monitor for 14 days.  This is a single patch monitor. [employer] supplies one patch monitor per enrollment. Additional stickers are not available. Please do not apply patch if you will be having a Nuclear Stress Test,  Echocardiogram, Cardiac CT, MRI, or Chest Xray during the period you would be wearing the  monitor. The patch cannot be worn during these tests. You cannot remove and re-apply the  ZIO XT patch monitor.  Your ZIO patch monitor will be mailed 3 day USPS to your address on file. It may take 3-5 days  to receive your monitor after you have been enrolled.  Once you have received your monitor, please review the enclosed instructions. Your monitor  has already been registered assigning a specific monitor serial # to you.  Billing and Patient Assistance Program Information  We have supplied [employer] with any of your insurance information on file for billing purposes. [employer] offers a sliding scale Patient Assistance Program for patients that do not have  insurance, or whose insurance does not completely cover the cost of the ZIO monitor.  You must apply for the Patient Assistance Program to qualify for this discounted rate.  To apply, please call [employer] at [phone], select  option 4, select option 2, ask to apply for  Patient Assistance Program. Theodore Demark will ask your household income, and how many people  are in your household. They will quote your out-of-pocket cost based on that information.  [employer] will also be able to set up a 41-month interest-free payment plan if needed.  Applying the monitor   Shave hair from upper left chest.  Hold abrader disc by orange tab. Rub abrader in 40 strokes over the upper left chest as  indicated in your monitor instructions.  Clean area with 4 enclosed alcohol pads. Let dry.  Apply patch as indicated in monitor instructions. Patch will be placed under collarbone on left  side of chest with arrow pointing upward.  Rub patch adhesive wings for 2 minutes. Remove white label marked "1". Remove the white  label marked "2". Rub patch adhesive wings for 2 additional minutes.  While looking in a mirror, press and release button in center of patch. A small green light will  flash 3-4 times. This will be your only indicator that the monitor has been turned on.  Do not shower for the first 24 hours. You may shower after the first 24 hours.  Press the button if you feel a symptom. You will hear a small click. Record Date, Time and  Symptom in the Patient Logbook.  When you are ready to remove the patch, follow instructions on the last 2 pages of Patient  Logbook. Stick patch monitor onto the last page of Patient Logbook.  Place Patient Logbook in  the blue and white box. Use locking tab on box and tape box closed  securely. The blue and white box has prepaid postage on it. Please place it in the mailbox as  soon as possible. Your physician should have your test results approximately 7 days after the  monitor has been mailed back to [facility].  Call [place] at [phone] if you have questions regarding  your ZIO XT patch monitor. Call them immediately if you see an orange light blinking on your  monitor.   If your monitor falls off in less than 4 days, contact our Monitor department at [phone].  If your monitor becomes loose or falls off after 4 days call [employer] at [phone] for  suggestions on securing your monitor  Your physician has requested that you have an echocardiogram. Echocardiography is a painless test that uses sound waves to create images of your heart. It provides your doctor with information about the size and shape of your heart and how well your heart's chambers and valves are working. This procedure takes approximately one hour. There are no restrictions for this procedure.    Follow-Up: At [facility], you and your health needs are our priority.  As part of our continuing mission to provide you with exceptional heart care, we have created designated Provider Care Teams.  These Care Teams include your primary Cardiologist (physician) and Advanced Practice Providers (APPs -  Physician Assistants and Nurse Practitioners) who all work together to provide you with the care you need, when you need it.  We recommend signing up for the patient portal called "MyChart".  Sign up information is provided on this After Visit Summary.  MyChart is used to connect with patients for Virtual Visits (Telemedicine).  Patients are able to view lab/test results, encounter notes, upcoming appointments, etc.  Non-urgent messages can be sent to your provider as well.   To learn more about what you can do with MyChart, go to [email or web address].    Your next appointment:   1 year(s)  The format for your next appointment:   In Person  Provider:   Berniece Salines, DO   Other Instructions

## 2021-01-03 NOTE — Progress Notes (Unsigned)
Enrolled for [employer] to mail a ZIO XT long term holter monitor to the patients address on file.  

## 2021-01-07 DIAGNOSIS — R002 Palpitations: Secondary | ICD-10-CM

## 2021-01-20 ENCOUNTER — Other Ambulatory Visit (INDEPENDENT_AMBULATORY_CARE_PROVIDER_SITE_OTHER): Payer: Self-pay | Admitting: Physician Assistant

## 2021-01-20 DIAGNOSIS — R7303 Prediabetes: Secondary | ICD-10-CM

## 2021-01-20 NOTE — Telephone Encounter (Signed)
LAST APPOINTMENT DATE: 11/10/20 NEXT APPOINTMENT DATE: 02/15/21   [id number] - [place], [address] [place] [address] Phone: [phone] Fax: [phone]  Patient is requesting a refill of the following medications: Pending Prescriptions:                       Disp   Refills   metFORMIN (GLUCOPHAGE) 500 MG tablet       30 tab*0       Sig: Take 1 tablet (500 mg total) by mouth daily with          breakfast.   Date last filled: 11/17/20 Previously prescribed by Abby Potash  Lab Results      Component                Value               Date                      HGBA1C                   6.0 (H)             08/18/2020                HGBA1C                   5.6                 03/31/2020                HGBA1C                   5.8 (H)             12/11/2019           Lab Results      Component                Value               Date                      LDLCALC                  87                  03/31/2020                CREATININE               0.74                08/18/2020           Lab Results      Component                Value               Date                      VD25OH                   49.1                08/18/2020                VD25OH  49.6                03/31/2020                VD25OH                   78.8                12/11/2019            BP Readings from Last 3 Encounters: 01/03/21 : (!) 130/92 11/10/20 : 134/82 10/05/20 : (!) 148/98

## 2021-01-24 ENCOUNTER — Other Ambulatory Visit (INDEPENDENT_AMBULATORY_CARE_PROVIDER_SITE_OTHER): Payer: Self-pay | Admitting: Physician Assistant

## 2021-01-24 DIAGNOSIS — R7303 Prediabetes: Secondary | ICD-10-CM

## 2021-01-24 MED ORDER — METFORMIN HCL 500 MG PO TABS: 500.0000 mg | ORAL_TABLET | Freq: Every day | ORAL | 0 refills | Status: DC

## 2021-01-24 NOTE — Telephone Encounter (Signed)
Pt last seen by Tracey Aguilar, PA-C.  

## 2021-01-25 ENCOUNTER — Ambulatory Visit (HOSPITAL_COMMUNITY): Payer: BC Managed Care – PPO | Attending: Internal Medicine

## 2021-01-25 ENCOUNTER — Other Ambulatory Visit: Payer: Self-pay

## 2021-01-25 DIAGNOSIS — R0602 Shortness of breath: Secondary | ICD-10-CM

## 2021-01-25 LAB — ECHOCARDIOGRAM COMPLETE
Area-P 1/2: 3.99 cm2
S' Lateral: 4.2 cm

## 2021-01-27 ENCOUNTER — Ambulatory Visit: Payer: BC Managed Care – PPO | Admitting: Nurse Practitioner

## 2021-01-27 ENCOUNTER — Ambulatory Visit: Payer: BC Managed Care – PPO | Admitting: Obstetrics & Gynecology

## 2021-01-27 ENCOUNTER — Encounter: Payer: Self-pay | Admitting: Nurse Practitioner

## 2021-01-27 ENCOUNTER — Other Ambulatory Visit: Payer: Self-pay

## 2021-01-27 ENCOUNTER — Ambulatory Visit (INDEPENDENT_AMBULATORY_CARE_PROVIDER_SITE_OTHER): Payer: BC Managed Care – PPO | Admitting: Nurse Practitioner

## 2021-01-27 ENCOUNTER — Encounter: Payer: Self-pay | Admitting: Cardiology

## 2021-01-27 VITALS — BP 118/82 | HR 78 | Resp 12 | Ht 68.0 in | Wt 214.0 lb

## 2021-01-27 DIAGNOSIS — N951 Menopausal and female climacteric states: Secondary | ICD-10-CM | POA: Diagnosis not present

## 2021-01-27 DIAGNOSIS — Z01419 Encounter for gynecological examination (general) (routine) without abnormal findings: Secondary | ICD-10-CM

## 2021-01-27 DIAGNOSIS — R7303 Prediabetes: Secondary | ICD-10-CM

## 2021-01-27 DIAGNOSIS — Z780 Asymptomatic menopausal state: Secondary | ICD-10-CM | POA: Diagnosis not present

## 2021-01-27 DIAGNOSIS — Z1211 Encounter for screening for malignant neoplasm of colon: Secondary | ICD-10-CM | POA: Diagnosis not present

## 2021-01-27 DIAGNOSIS — K5904 Chronic idiopathic constipation: Secondary | ICD-10-CM | POA: Diagnosis not present

## 2021-01-27 DIAGNOSIS — Z86010 Personal history of colonic polyps: Secondary | ICD-10-CM | POA: Diagnosis not present

## 2021-01-27 DIAGNOSIS — E669 Obesity, unspecified: Secondary | ICD-10-CM | POA: Diagnosis not present

## 2021-01-27 MED ORDER — ESTRADIOL 0.1 MG/GM VA CREA: 1.0000 | TOPICAL_CREAM | VAGINAL | 12 refills | Status: DC

## 2021-01-27 NOTE — Progress Notes (Signed)
Crystal Simmons 1960-11-22 [id number]   History:  60 y.o. G0 presents for annual exam. Complains of vaginal dryness and pain with intercourse. She was prescribed Estring last year but it was costly. She was using vaginal estrogen cream prior but was not consistent with it. Postmenopausal - no HRT, no bleeding. 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 Pre-diabetes managed by PCP.   Gynecologic History Patient's last menstrual period was 10/14/2005 (approximate).   Contraception/Family planning: post menopausal status Sexually active: Yes  Health Maintenance Last Pap: 01/21/2019. Results were: Normal, 3-year repeat Last mammogram: 05/08/2020. Results were: Normal Last colonoscopy: 01/21/2016. Results were: Polyps, 5-year recall Last Dexa: Not indicated  Past medical history, past surgical history, family history and social history were all reviewed and documented in the EPIC chart. Works in Editor, commissioning for [employer], has just been required to go back in person.   ROS:  A ROS was performed and pertinent positives and negatives are included.  Exam:  Vitals:   01/27/21 1419  BP: 118/82  Pulse: 78  Resp: 12  Weight: 214 lb (97.1 kg)  Height: 5\' 8"  (1.727 m)   Body mass index is 32.54 kg/m.  General appearance:  Normal Thyroid:  Symmetrical, normal in size, without palpable masses or nodularity. Respiratory  Auscultation:  Clear without wheezing or rhonchi Cardiovascular  Auscultation:  Regular rate, without rubs, murmurs or gallops  Edema/varicosities:  Not grossly evident Abdominal  Soft,nontender, without masses, guarding or rebound.  Liver/spleen:  No organomegaly noted  Hernia:  None appreciated  Skin  Inspection:  Grossly normal Breasts: Examined lying and sitting.   Right: Without masses, retractions, nipple discharge or axillary adenopathy.   Left: Without masses, retractions, nipple discharge or axillary adenopathy. Genitourinary   Inguinal/mons:  Normal without inguinal  adenopathy  External genitalia:  Normal appearing vulva with no masses, tenderness, or lesions  BUS/Urethra/Skene's glands:  Normal  Vagina:  Normal appearing with normal color and discharge, no lesions  Cervix:  Normal appearing without discharge or lesions  Uterus:  Normal in size, shape and contour.  Midline and mobile, nontender  Adnexa/parametria:     Rt: Normal in size, without masses or tenderness.   Lt: Normal in size, without masses or tenderness.  Anus and perineum: Normal  Digital rectal exam: Normal sphincter tone without palpated masses or tenderness  Patient informed chaperone available to be present for breast and pelvic exam. Patient has requested no chaperone to be present. Patient has been advised what will be completed during breast and pelvic exam.   Assessment/Plan:  60 y.o. G0 for annual exam.   Well female exam with routine gynecological exam - Education provided on SBEs, importance of preventative screenings, current guidelines, high calcium diet, regular exercise, and multivitamin daily. Labs with PCP and weight management center.   Postmenopausal - no HRT, no bleeding.   Prediabetes - Plan: Hemoglobin A1c. Has not had checked since 08/2020 when it was 6.0 and would like it rechecked today.   Menopausal vaginal dryness - Plan: estradiol (ESTRACE VAGINAL) 0.1 MG/GM vaginal cream twice weekly. We discussed best results with consistent use. She is aware of risk for systemic absorption increasing her risk for blood clots, heart attack, stroke, and breast cancer.   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 Will repeat at 3-year interval per guidelines.  Screening for breast cancer - Normal mammogram history.  Continue annual screenings.  Normal breast exam today.  Screening for colon cancer - 2017 colonoscopy. Has consultation today.   Screening  for osteoporosis - Average risk. Will plan for DXA at age 35.   Return in 1 year for annual.    Tamela Gammon DNP, 2:37 PM 01/27/2021

## 2021-01-27 NOTE — Telephone Encounter (Signed)
Patient has recall in for next November. Will see if Dr. Harriet Masson wants to see patient sooner due to echo results.

## 2021-01-28 ENCOUNTER — Telehealth: Payer: Self-pay

## 2021-01-28 ENCOUNTER — Ambulatory Visit (INDEPENDENT_AMBULATORY_CARE_PROVIDER_SITE_OTHER): Payer: BC Managed Care – PPO | Admitting: Cardiology

## 2021-01-28 VITALS — BP 118/82 | Wt 214.0 lb

## 2021-01-28 DIAGNOSIS — I4729 Other ventricular tachycardia: Secondary | ICD-10-CM | POA: Diagnosis not present

## 2021-01-28 DIAGNOSIS — Z79899 Other long term (current) drug therapy: Secondary | ICD-10-CM

## 2021-01-28 LAB — HEMOGLOBIN A1C
Hgb A1c MFr Bld: 5.7 % of total Hgb — ABNORMAL HIGH (ref <5.7)
Mean Plasma Glucose: 117 mg/dL
eAG (mmol/L): 6.5 mmol/L

## 2021-01-28 MED ORDER — METOPROLOL SUCCINATE ER 25 MG PO TB24: 12.5000 mg | ORAL_TABLET | Freq: Every day | ORAL | 3 refills | Status: DC

## 2021-01-28 NOTE — Telephone Encounter (Signed)
Spoke to pt. She was added to schedule today to speak to Dr. Harriet Masson.

## 2021-01-28 NOTE — Patient Instructions (Addendum)
Medication Instructions:  Your physician has recommended you make the following change in your medication:  STOP: Metoprolol Tartrate (Lopressor) START: Metoprolol Succinate (Toprol- XL) 12.5 mg once daily *If you need a refill on your cardiac medications before your next appointment, please call your pharmacy*   Lab Work: Your physician recommends that you return for lab work in:  3-7 days before CT Scan: BMET, Mag If you have labs (blood work) drawn today and your tests are completely normal, you will receive your results only by: Omer (if you have MyChart) OR A paper copy in the mail If you have any lab test that is abnormal or we need to change your treatment, we will call you to review the results.   Testing/Procedures:   Your cardiac CT will be scheduled at one of the below locations:   [facility] [address] [place], [place] [address] [phone]   If scheduled at [facility], please arrive at the [facility] main entrance (entrance A) of [facility] 30 minutes prior to test start time. You can use the FREE valet parking offered at the main entrance (encouraged to control the heart rate for the test) Proceed to the [facility] Radiology Department (first floor) to check-in and test prep.   Please follow these instructions carefully (unless otherwise directed):  On the Night Before the Test: Be sure to Drink plenty of water. Do not consume any caffeinated/decaffeinated beverages or chocolate 12 hours prior to your test. Do not take any antihistamines 12 hours prior to your test.  On the Day of the Test: Drink plenty of water until 1 hour prior to the test. Do not eat any food 4 hours prior to the test. You may take your regular medications prior to the test.  Take metoprolol (Lopressor) 100 mg two hours prior to test. HOLD Metoprolol Succinate (Toprol-XL) day of procedure.  HOLD Metformin 48 hours after procedure.   FEMALES- please wear underwire-free bra if available, avoid dresses & tight clothing      After the Test: Drink plenty of water. After receiving IV contrast, you may experience a mild flushed feeling. This is normal. On occasion, you may experience a mild rash up to 24 hours after the test. This is not dangerous. If this occurs, you can take Benadryl 25 mg and increase your fluid intake. If you experience trouble breathing, this can be serious. If it is severe call 911 IMMEDIATELY. If it is mild, please call our office. If you take any of these medications: Metformin please do not take 48 hours after completing test unless otherwise instructed.  Please allow 2-4 weeks for scheduling of routine cardiac CTs. Some insurance companies require a pre-authorization which may delay scheduling of this test.   For non-scheduling related questions, please contact the cardiac imaging nurse navigator should you have any questions/concerns: Marchia Bond, Cardiac Imaging Nurse Navigator Gordy Clement, Cardiac Imaging Nurse Navigator [facility] Direct Office Dial: [phone]   For scheduling needs, including cancellations and rescheduling, please call [place], [phone].    Follow-Up: At [facility], you and your health needs are our priority.  As part of our continuing mission to provide you with exceptional heart care, we have created designated Provider Care Teams.  These Care Teams include your primary Cardiologist (physician) and Advanced Practice Providers (APPs -  Physician Assistants and Nurse Practitioners) who all work together to provide you with the care you need, when you need  it.  We recommend signing up for the patient portal called "MyChart".  Sign up information is provided on this After Visit Summary.  MyChart is used to connect with patients for Virtual Visits (Telemedicine).  Patients are able to view lab/test results, encounter notes, upcoming  appointments, etc.  Non-urgent messages can be sent to your provider as well.   To learn more about what you can do with MyChart, go to [email or web address].    Your next appointment:   1 year   The format for your next appointment:   In Person  Provider:   Berniece Salines, DO     Other Instructions

## 2021-01-28 NOTE — Progress Notes (Signed)
Virtual Visit via Telephone Note   This visit type was conducted due to national recommendations for restrictions regarding the COVID-19 Pandemic (e.g. social distancing) in an effort to limit this patient's exposure and mitigate transmission in our community.  Due to her co-morbid illnesses, this patient is at least at moderate risk for complications without adequate follow up.  This format is felt to be most appropriate for this patient at this time.  The patient did not have access to video technology/had technical difficulties with video requiring transitioning to audio format only (telephone).  All issues noted in this document were discussed and addressed.  No physical exam could be performed with this format.  Please refer to the patient's chart for her  consent to telehealth for [facility].   Date:  01/28/2021   ID:  Crystal Simmons, DOB November 27, 1960, MRN [id number]  Patient Location: Other:  work Provider Location: Office/Clinic  PCP:  Denita Lung, MD  Cardiologist:  Berniece Salines, DO  Electrophysiologist:  None   Evaluation Performed:  Follow-Up Visit  Chief Complaint:  " I am having some  shortness of breath"  History of Present Illness:    Crystal Simmons is a 60 y.o. female with vitamin D deficiency, prediabetes, GERD here today for follow-up visit.  First saw the patient on January 03, 2021 at that time recommended she get an echocardiogram as well as a ZIO monitor.  She was able to get this testing done she is here today to discuss the result.  The patient does not have symptoms concerning for COVID-19 infection (fever, chills, cough, or new shortness of breath).    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   Insomnia    Joint pain    Lactose intolerance     Menopausal state 2007   Seasonal allergies    Shingles 04/2018   STD (sexually transmitted disease) 8/09   chlamydia   Vitamin D deficiency disease 2010   Past Surgical History:  Procedure Laterality Date   BREAST SURGERY Left 6/03   Biopsy negative   CERVIX LESION DESTRUCTION  4/92   COLONOSCOPY W/ BIOPSIES  09/26/10   polyp benign   COLPOSCOPY  10/12   CIN I   KNEE SURGERY  12/2009   left for meniscal tear   LAPAROSCOPY  7/02   multiple surgeries, including LSO, LOA, and endometrioma, and fibroids secondary to endometriosis   UMBILICAL EXPLORATION  8/41   for endometrioma      No outpatient medications have been marked as taking for the 01/28/21 encounter (Appointment) with Berniece Salines, DO.     Allergies:   Sulfa antibiotics and Codeine   Social History   Tobacco Use   Smoking status: Never   Smokeless tobacco: Never  Vaping Use   Vaping Use: Never used  Substance Use Topics   Alcohol use: Yes    Comment: 2-3 times a month   Drug use: No     Family Hx: The patient's family history includes Diabetes in her brother, father, and paternal grandfather; Heart disease in her father; Hypertension in her father and mother; Sudden death in her father.  ROS:   Review of Systems  Constitution: Negative for decreased appetite, fever and  weight gain.  HENT: Negative for congestion, ear discharge, hoarse voice and sore throat.   Eyes: Negative for discharge, redness, vision loss in right eye and visual halos.  Cardiovascular: Reports dyspnea on exertion.  Negative for, leg swelling, orthopnea and palpitations.  Respiratory: Negative for cough, hemoptysis, shortness of breath and snoring.   Endocrine: Negative for heat intolerance and polyphagia.  Hematologic/Lymphatic: Negative for bleeding problem. Does not bruise/bleed easily.  Skin: Negative for flushing, nail changes, rash and suspicious lesions.  Musculoskeletal: Negative for arthritis, joint pain, muscle cramps,  myalgias, neck pain and stiffness.  Gastrointestinal: Negative for abdominal pain, bowel incontinence, diarrhea and excessive appetite.  Genitourinary: Negative for decreased libido, genital sores and incomplete emptying.  Neurological: Negative for brief paralysis, focal weakness, headaches and loss of balance.  Psychiatric/Behavioral: Negative for altered mental status, depression and suicidal ideas.  Allergic/Immunologic: Negative for HIV exposure and persistent infections.     Prior CV studies:   The following studies were reviewed today:  TTE 01/25/2021 IMPRESSIONS     1. Left ventricular ejection fraction, by estimation, is 50 to 55%. Left  ventricular ejection fraction by 3D volume is 51 %. The left ventricle has  low normal function. The left ventricle has no regional wall motion  abnormalities. Left ventricular  diastolic parameters are consistent with Grade I diastolic dysfunction  (impaired relaxation). The average left ventricular global longitudinal  strain is -20.8 %. The global longitudinal strain is normal.   2. Right ventricular systolic function is normal. The right ventricular  size is normal. Tricuspid regurgitation signal is inadequate for assessing  PA pressure.   3. The mitral valve is abnormal. Trivial mitral valve regurgitation.   4. The aortic valve is tricuspid. Aortic valve regurgitation is not  visualized.   5. The inferior vena cava is normal in size with <50% respiratory  variability, suggesting right atrial pressure of 8 mmHg.   Comparison(s): No prior Echocardiogram.   FINDINGS   Left Ventricle: Left ventricular ejection fraction, by estimation, is 50  to 55%. Left ventricular ejection fraction by 3D volume is 51 %. The left  ventricle has low normal function. The left ventricle has no regional wall  motion abnormalities. The  average left ventricular global longitudinal strain is -20.8 %. The global  longitudinal strain is normal. The left  ventricular internal cavity size  was normal in size. There is no left ventricular hypertrophy. Left  ventricular diastolic parameters are  consistent with Grade I diastolic dysfunction (impaired relaxation).  Indeterminate filling pressures.   Right Ventricle: The right ventricular size is normal. No increase in  right ventricular wall thickness. Right ventricular systolic function is  normal. Tricuspid regurgitation signal is inadequate for assessing PA  pressure.   Left Atrium: Left atrial size was normal in size.   Right Atrium: Right atrial size was normal in size.   Pericardium: There is no evidence of pericardial effusion.   Mitral Valve: The mitral valve is abnormal. There is mild thickening of  the mitral valve leaflet(s). Trivial mitral valve regurgitation.   Tricuspid Valve: The tricuspid valve is grossly normal. Tricuspid valve  regurgitation is trivial.   Aortic Valve: The aortic valve is tricuspid. Aortic valve regurgitation is  not visualized.   Pulmonic Valve: The pulmonic valve was grossly normal. Pulmonic valve  regurgitation is trivial.   Aorta: The aortic root and ascending aorta are structurally normal, with  no evidence of dilitation.   Venous: The inferior vena cava is normal in size  with less than 50%  respiratory variability, suggesting right atrial pressure of 8 mmHg.   IAS/Shunts: No atrial level shunt detected by color flow Doppler.   Labs/Other Tests and Data Reviewed:    EKG:  No ECG reviewed.  Recent Labs: 03/31/2020: Hemoglobin 12.8; Platelets 300; TSH 2.860 08/18/2020: ALT 19; BUN 10; Creatinine, Ser 0.74; Potassium 4.7; Sodium 142   Recent Lipid Panel Lab Results  Component Value Date/Time   CHOL 167 03/31/2020 07:58 AM   TRIG 72 03/31/2020 07:58 AM   HDL 66 03/31/2020 07:58 AM   CHOLHDL 2.5 03/31/2020 07:58 AM   CHOLHDL 2.7 12/13/2015 11:04 AM   LDLCALC 87 03/31/2020 07:58 AM    Wt Readings from Last 3 Encounters:  01/27/21 214  lb (97.1 kg)  01/03/21 214 lb (97.1 kg)  11/10/20 202 lb (91.6 kg)     Objective:    Vital Signs:  LMP 10/14/2005 (Approximate)    No physical exam performed virtual visit  ASSESSMENT & PLAN:    NSVT Paroxysmal SVT Dyspnea exertion   I discussed with the patient by her testing result.  Both her monitor as well as echocardiogram.  There is evidence of NSVT and paroxysmal SVT on her monitor.  We will start the patient on low-dose beta-blocker 12.5 mg daily.  In addition she is having some dyspnea exertion with the NSVT and family history elected proceed with ischemic evaluation in this patient.  She is agreeable for coronary CTA.  She has no IV contrast allergy.  COVID-19 Education: The signs and symptoms of COVID-19 were discussed with the patient and how to seek care for testing (follow up with PCP or arrange E-visit).  The importance of social distancing was discussed today.  Time:   Today, I have spent 10 minutes with the patient with telehealth technology discussing the above problems.     Medication Adjustments/Labs and Tests Ordered: Current medicines are reviewed at length with the patient today.  Concerns regarding medicines are outlined above.   Tests Ordered: No orders of the defined types were placed in this encounter.   Medication Changes: No orders of the defined types were placed in this encounter.   Follow Up:  In Person  scheduled  Rolly Pancake, DO  01/28/2021 10:38 AM    [facility]

## 2021-01-28 NOTE — Telephone Encounter (Signed)
Called pt to let her know Dr. Harriet Masson would like to speak with her today.Pt agreeable, added to schedule.

## 2021-02-11 ENCOUNTER — Telehealth (HOSPITAL_COMMUNITY): Payer: Self-pay | Admitting: Emergency Medicine

## 2021-02-11 LAB — BASIC METABOLIC PANEL
BUN/Creatinine Ratio: 19 (ref 12–28)
BUN: 16 mg/dL (ref 8–27)
CO2: 23 mmol/L (ref 20–29)
Calcium: 9.3 mg/dL (ref 8.7–10.3)
Chloride: 102 mmol/L (ref 96–106)
Creatinine, Ser: 0.84 mg/dL (ref 0.57–1.00)
Glucose: 92 mg/dL (ref 70–99)
Potassium: 4.4 mmol/L (ref 3.5–5.2)
Sodium: 138 mmol/L (ref 134–144)
eGFR: 80 mL/min/{1.73_m2} (ref >59)

## 2021-02-11 LAB — MAGNESIUM: Magnesium: 1.8 mg/dL (ref 1.6–2.3)

## 2021-02-11 NOTE — Telephone Encounter (Signed)
Reaching out to patient to offer assistance regarding upcoming cardiac imaging study; pt verbalizes understanding of appt date/time, parking situation and where to check in, pre-test NPO status and medications ordered, and verified current allergies; name and call back number provided for further questions should they arise Marchia Bond RN Navigator Cardiac Imaging [facility] [phone] office [phone] cell  Holding daily succinate, taking 100mg  tartrate Holding metformin 48 hr post  Denies iv issues Arrival 330

## 2021-02-15 ENCOUNTER — Ambulatory Visit (INDEPENDENT_AMBULATORY_CARE_PROVIDER_SITE_OTHER): Payer: BC Managed Care – PPO | Admitting: Physician Assistant

## 2021-02-15 ENCOUNTER — Other Ambulatory Visit (INDEPENDENT_AMBULATORY_CARE_PROVIDER_SITE_OTHER): Payer: Self-pay | Admitting: Physician Assistant

## 2021-02-15 ENCOUNTER — Other Ambulatory Visit: Payer: Self-pay

## 2021-02-15 ENCOUNTER — Encounter (INDEPENDENT_AMBULATORY_CARE_PROVIDER_SITE_OTHER): Payer: Self-pay | Admitting: Physician Assistant

## 2021-02-15 ENCOUNTER — Other Ambulatory Visit (INDEPENDENT_AMBULATORY_CARE_PROVIDER_SITE_OTHER): Payer: Self-pay

## 2021-02-15 VITALS — BP 125/79 | HR 75 | Temp 98.3°F | Ht 68.0 in | Wt 210.0 lb

## 2021-02-15 DIAGNOSIS — R7303 Prediabetes: Secondary | ICD-10-CM

## 2021-02-15 DIAGNOSIS — Z9189 Other specified personal risk factors, not elsewhere classified: Secondary | ICD-10-CM | POA: Diagnosis not present

## 2021-02-15 DIAGNOSIS — Z6832 Body mass index (BMI) 32.0-32.9, adult: Secondary | ICD-10-CM | POA: Diagnosis not present

## 2021-02-15 DIAGNOSIS — E669 Obesity, unspecified: Secondary | ICD-10-CM

## 2021-02-15 DIAGNOSIS — G4709 Other insomnia: Secondary | ICD-10-CM

## 2021-02-15 MED ORDER — METFORMIN HCL 500 MG PO TABS: 500.0000 mg | ORAL_TABLET | Freq: Every day | ORAL | 0 refills | Status: DC

## 2021-02-15 MED ORDER — ESZOPICLONE 1 MG PO TABS: 1.0000 mg | ORAL_TABLET | Freq: Every evening | ORAL | 0 refills | Status: DC | PRN

## 2021-02-15 NOTE — Progress Notes (Signed)
Chief Complaint:   OBESITY Crystal Simmons is here to discuss her progress with her obesity treatment plan along with follow-up of her obesity related diagnoses. Crystal Simmons is on keeping a food journal and adhering to recommended goals of 1200-1300 calories and 85 grams protein and states she is following her eating plan approximately 60% of the time. Crystal Simmons states she is walking, YouTube videos, and elliptical 60 minutes 4 times per week.  Today's visit was #: 18 Starting weight: 211 lbs Starting date: 05/01/2019 Today's weight: 210 lbs Today's date: 02/15/2021 Total lbs lost to date: 1 Total lbs lost since last in-office visit: 0  Interim History: Crystal Simmons states that she was somewhat off plan, eating more calories than she should, especially over the holiday. She reports that she is constipated. Denies blood in stool or black stools. Colonoscopy scheduled for 04/2021. Pt is only drinking 72 oz of water daily.  Subjective:   1. Other insomnia Pt reports [name] is working well and she takes it as needed.  2. Pre-diabetes Her last A1c was improved at 5.7. Pt is tolerating Metformin daily.  3. At risk for diabetes mellitus [name] is at higher than average risk for developing diabetes due to obesity.   Assessment/Plan:   1. Other insomnia The problem of recurrent insomnia was discussed. Orders and follow up as documented in patient record. Counseling: Intensive lifestyle modifications are the first line treatment for this issue. We discussed several lifestyle modifications today and she will continue to work on diet, exercise and weight loss efforts.   Refill- Lunesta 1 mg, Disp #30, 0 RF  Counseling Limit or avoid alcohol, caffeinated beverages, and cigarettes, especially close to bedtime.  Do not eat a large meal or eat spicy foods right before bedtime. This can lead to digestive discomfort that can make it hard for you to sleep. Keep a sleep diary to help you and your health care provider  figure out what could be causing your insomnia.  Make your bedroom a dark, comfortable place where it is easy to fall asleep. Put up shades or blackout curtains to block light from outside. Use a white noise machine to block noise. Keep the temperature cool. Limit screen use before bedtime. This includes: Watching TV. Using your smartphone, tablet, or computer. Stick to a routine that includes going to bed and waking up at the same times every day and night. This can help you fall asleep faster. Consider making a quiet activity, such as reading, part of your nighttime routine. Try to avoid taking naps during the day so that you sleep better at night. Get out of bed if you are still awake after 15 minutes of trying to sleep. Keep the lights down, but try reading or doing a quiet activity. When you feel sleepy, go back to bed.  2. Pre-diabetes [name] will continue to work on weight loss, exercise, and decreasing simple carbohydrates to help decrease the risk of diabetes.   Refill- metFORMIN (GLUCOPHAGE) 500 MG tablet; Take 1 tablet (500 mg total) by mouth daily with breakfast.  Dispense: 30 tablet; Refill: 0  3. At risk for diabetes mellitus Crystal Simmons was given approximately 15 minutes of diabetes education and counseling today. We discussed intensive lifestyle modifications today with an emphasis on weight loss as well as increasing exercise and decreasing simple carbohydrates in her diet. We also reviewed medication options with an emphasis on risk versus benefit of those discussed.   Repetitive spaced learning was employed today to elicit superior  memory formation and behavioral change.  4. Obesity, current BMI 31.94  Crystal Simmons is currently in the action stage of change. As such, her goal is to continue with weight loss efforts. She has agreed to keeping a food journal and adhering to recommended goals of 1200-1300 calories and 85 grams protein.   Exercise goals:  As is  Behavioral  modification strategies: increasing water intake and meal planning and cooking strategies.  Crystal Simmons has agreed to follow-up with our clinic in 4 weeks. She was informed of the importance of frequent follow-up visits to maximize her success with intensive lifestyle modifications for her multiple health conditions.   Objective:   Blood pressure 125/79, pulse 75, temperature 98.3 F (36.8 C), height 5\' 8"  (1.727 m), weight 210 lb (95.3 kg), last menstrual period 10/14/2005, SpO2 98 %. Body mass index is 31.93 kg/m.  General: Cooperative, alert, well developed, in no acute distress. HEENT: Conjunctivae and lids unremarkable. Cardiovascular: Regular rhythm.  Lungs: Normal work of breathing. Neurologic: No focal deficits.   Lab Results  Component Value Date   CREATININE 0.84 02/11/2021   BUN 16 02/11/2021   NA 138 02/11/2021   K 4.4 02/11/2021   CL 102 02/11/2021   CO2 23 02/11/2021   Lab Results  Component Value Date   ALT 19 08/18/2020   AST 19 08/18/2020   ALKPHOS 100 08/18/2020   BILITOT 0.5 08/18/2020   Lab Results  Component Value Date   HGBA1C 5.7 (H) 01/27/2021   HGBA1C 6.0 (H) 08/18/2020   HGBA1C 5.6 03/31/2020   HGBA1C 5.8 (H) 12/11/2019   HGBA1C 5.9 (H) 09/01/2019   Lab Results  Component Value Date   INSULIN 13.1 08/18/2020   INSULIN 6.3 03/31/2020   INSULIN 5.1 12/11/2019   INSULIN 11.0 09/01/2019   INSULIN 8.9 05/01/2019   Lab Results  Component Value Date   TSH 2.860 03/31/2020   Lab Results  Component Value Date   CHOL 167 03/31/2020   HDL 66 03/31/2020   LDLCALC 87 03/31/2020   TRIG 72 03/31/2020   CHOLHDL 2.5 03/31/2020   Lab Results  Component Value Date   VD25OH 49.1 08/18/2020   VD25OH 49.6 03/31/2020   VD25OH 78.8 12/11/2019   Lab Results  Component Value Date   WBC 6.6 03/31/2020   HGB 12.8 03/31/2020   HCT 38.6 03/31/2020   MCV 95 03/31/2020   PLT 300 03/31/2020   Lab Results  Component Value Date   IRON 93 03/31/2020    TIBC 264 03/31/2020   FERRITIN 65 03/31/2020   Attestation Statements:   Reviewed by clinician on day of visit: allergies, medications, problem list, medical history, surgical history, family history, social history, and previous encounter notes.  Coral Ceo, CMA, am acting as transcriptionist for [employer], PA-C.  I have reviewed the above documentation for accuracy and completeness, and I agree with the above. Abby Potash, PA-C

## 2021-02-16 ENCOUNTER — Ambulatory Visit (HOSPITAL_COMMUNITY)
Admission: RE | Admit: 2021-02-16 | Discharge: 2021-02-16 | Disposition: A | Discharge status: Home / Self Care | Payer: BC Managed Care – PPO | Source: Ambulatory Visit | Attending: Cardiology | Admitting: Cardiology

## 2021-02-16 ENCOUNTER — Encounter (HOSPITAL_COMMUNITY): Payer: Self-pay

## 2021-02-16 DIAGNOSIS — I4729 Other ventricular tachycardia: Secondary | ICD-10-CM | POA: Diagnosis not present

## 2021-02-16 MED ORDER — NITROGLYCERIN 0.4 MG SL SUBL: 0.8000 mg | SUBLINGUAL_TABLET | Freq: Once | SUBLINGUAL | Status: AC

## 2021-02-16 MED ORDER — IOHEXOL 350 MG/ML SOLN
95.0000 mL | Freq: Once | INTRAVENOUS | Status: AC | PRN
  Administered 2021-02-16: 95 mL via INTRAVENOUS

## 2021-02-16 MED ORDER — NITROGLYCERIN 0.4 MG SL SUBL
SUBLINGUAL_TABLET | SUBLINGUAL | Status: AC
  Administered 2021-02-16: 0.8 mg via SUBLINGUAL
  Filled 2021-02-16: qty 2

## 2021-03-14 ENCOUNTER — Encounter (INDEPENDENT_AMBULATORY_CARE_PROVIDER_SITE_OTHER): Payer: Self-pay

## 2021-03-15 ENCOUNTER — Ambulatory Visit (INDEPENDENT_AMBULATORY_CARE_PROVIDER_SITE_OTHER): Payer: BC Managed Care – PPO | Admitting: Physician Assistant

## 2021-04-04 ENCOUNTER — Ambulatory Visit (INDEPENDENT_AMBULATORY_CARE_PROVIDER_SITE_OTHER): Payer: BC Managed Care – PPO | Admitting: Physician Assistant

## 2021-04-11 ENCOUNTER — Ambulatory Visit: Payer: BC Managed Care – PPO | Admitting: Orthopedic Surgery

## 2021-04-11 ENCOUNTER — Ambulatory Visit: Payer: Self-pay

## 2021-04-11 ENCOUNTER — Ambulatory Visit (INDEPENDENT_AMBULATORY_CARE_PROVIDER_SITE_OTHER): Payer: BC Managed Care – PPO

## 2021-04-11 DIAGNOSIS — M1712 Unilateral primary osteoarthritis, left knee: Secondary | ICD-10-CM | POA: Diagnosis not present

## 2021-04-11 DIAGNOSIS — M1711 Unilateral primary osteoarthritis, right knee: Secondary | ICD-10-CM | POA: Diagnosis not present

## 2021-04-11 DIAGNOSIS — M170 Bilateral primary osteoarthritis of knee: Secondary | ICD-10-CM

## 2021-04-15 ENCOUNTER — Other Ambulatory Visit: Payer: Self-pay | Admitting: Family Medicine

## 2021-04-15 ENCOUNTER — Other Ambulatory Visit: Payer: Self-pay | Admitting: Nurse Practitioner

## 2021-04-15 DIAGNOSIS — Z1231 Encounter for screening mammogram for malignant neoplasm of breast: Secondary | ICD-10-CM

## 2021-04-18 ENCOUNTER — Other Ambulatory Visit (INDEPENDENT_AMBULATORY_CARE_PROVIDER_SITE_OTHER): Payer: Self-pay | Admitting: Family Medicine

## 2021-04-18 ENCOUNTER — Ambulatory Visit (INDEPENDENT_AMBULATORY_CARE_PROVIDER_SITE_OTHER): Payer: BC Managed Care – PPO | Admitting: Family Medicine

## 2021-04-18 ENCOUNTER — Encounter (INDEPENDENT_AMBULATORY_CARE_PROVIDER_SITE_OTHER): Payer: Self-pay | Admitting: Family Medicine

## 2021-04-18 ENCOUNTER — Other Ambulatory Visit: Payer: Self-pay

## 2021-04-18 VITALS — BP 112/79 | HR 92 | Temp 98.0°F | Ht 68.0 in | Wt 206.0 lb

## 2021-04-18 DIAGNOSIS — R7303 Prediabetes: Secondary | ICD-10-CM

## 2021-04-18 DIAGNOSIS — Z9189 Other specified personal risk factors, not elsewhere classified: Secondary | ICD-10-CM

## 2021-04-18 DIAGNOSIS — G4709 Other insomnia: Secondary | ICD-10-CM | POA: Diagnosis not present

## 2021-04-18 DIAGNOSIS — Z6831 Body mass index (BMI) 31.0-31.9, adult: Secondary | ICD-10-CM

## 2021-04-18 DIAGNOSIS — E559 Vitamin D deficiency, unspecified: Secondary | ICD-10-CM

## 2021-04-18 DIAGNOSIS — K5909 Other constipation: Secondary | ICD-10-CM | POA: Diagnosis not present

## 2021-04-18 DIAGNOSIS — E669 Obesity, unspecified: Secondary | ICD-10-CM

## 2021-04-18 MED ORDER — METFORMIN HCL 500 MG PO TABS: ORAL_TABLET | ORAL | 0 refills | Status: DC

## 2021-04-18 MED ORDER — ESZOPICLONE 1 MG PO TABS: 1.0000 mg | ORAL_TABLET | Freq: Every evening | ORAL | 0 refills | Status: DC | PRN

## 2021-04-20 NOTE — Progress Notes (Signed)
Chief Complaint:   OBESITY Crystal Simmons is here to discuss her progress with her obesity treatment plan along with follow-up of her obesity related diagnoses. Crystal Simmons is on keeping a food journal and adhering to recommended goals of 1200-1300 calories and 85 grams of protein and states she is following her eating plan approximately 80% of the time. Crystal Simmons states she is walking for 60 minutes 3-4 times per week.  Today's visit was #: 75 Starting weight: 211 lbs Starting date: 05/01/2019 Today's weight: 206 lbs Today's date: 04/18/2021 Total lbs lost to date: 5 lbs Total lbs lost since last in-office visit: 4 lbs  Interim History: Crystal Simmons is a patient of Dr. Juleen China.  This is her first appointment with me.  Her last office visit was with Abby Potash, PA-C on 02/15/2021.  She has been dealing with knee pain, which threw her off and she was unable to exercise like she wanted to.  Denies issues with the meal plan.  Subjective:   1. Pre-diabetes Chevi's last A1c was 5.7 over 3 months ago.  She was started on medication because of "my elevated A1c".  Tolerating well.  2. Other insomnia Caden says [name] is working well.  Trazodone caused bad dreams, she says.  She reports sleeping well.  3. Other constipation Terree says she has increased her fiber intake (Metamucil).  Her GI doctor is doing a colonoscopy in mid-March, and she is currently taking Linzess.  Denies issues.  Drinks 90 oz of water daily.  4. Vitamin D deficiency Aniyiah is taking OTC vitamin D 1000 IU daily.  Last check was 8 months ago at 85.  5. At risk for deficient intake of food Skarlette is at risk for deficient intake of food due to using protein shake for lunch and not eating all daily calories or protein.  Assessment/Plan:  No orders of the defined types were placed in this encounter.   Medications Discontinued During This Encounter  Medication Reason   metFORMIN (GLUCOPHAGE) 500 MG tablet Reorder   eszopiclone  (LUNESTA) 1 MG TABS tablet Reorder     Meds ordered this encounter  Medications   eszopiclone (LUNESTA) 1 MG TABS tablet    Sig: Take 1 tablet (1 mg total) by mouth at bedtime as needed for sleep.    Dispense:  30 tablet    Refill:  0   metFORMIN (GLUCOPHAGE) 500 MG tablet    Sig: 1 po q lunch daily    Dispense:  30 tablet    Refill:  0     1. Pre-diabetes Crystal Simmons will continue to work on weight loss, exercise, and decreasing simple carbohydrates to help decrease the risk of diabetes.  We will refill metformin 500 mg.  Change to taking it at lunchtime.  Risks/benefits of medication discussed with patient.  - Refill metFORMIN (GLUCOPHAGE) 500 MG tablet; 1 po q lunch daily  Dispense: 30 tablet; Refill: 0  2. Other insomnia The problem of recurrent insomnia was discussed. Orders and follow up as documented in patient record. Counseling: Intensive lifestyle modifications are the first line treatment for this issue. We discussed several lifestyle modifications today and she will continue to work on diet, exercise and weight loss efforts.  We will refill Lunesta today.  Sleep hygiene discussed with patient.  Counseling Limit or avoid alcohol, caffeinated beverages, and cigarettes, especially close to bedtime.  Do not eat a large meal or eat spicy foods right before bedtime. This can lead to digestive discomfort that can make  it hard for you to sleep. Keep a sleep diary to help you and your health care provider figure out what could be causing your insomnia.  Make your bedroom a dark, comfortable place where it is easy to fall asleep. Put up shades or blackout curtains to block light from outside. Use a white noise machine to block noise. Keep the temperature cool. Limit screen use before bedtime. This includes: Watching TV. Using your smartphone, tablet, or computer. Stick to a routine that includes going to bed and waking up at the same times every day and night. This can help you fall  asleep faster. Consider making a quiet activity, such as reading, part of your nighttime routine. Try to avoid taking naps during the day so that you sleep better at night. Get out of bed if you are still awake after 15 minutes of trying to sleep. Keep the lights down, but try reading or doing a quiet activity. When you feel sleepy, go back to bed.  - Refill eszopiclone (LUNESTA) 1 MG TABS tablet; Take 1 tablet (1 mg total) by mouth at bedtime as needed for sleep.  Dispense: 30 tablet; Refill: 0  3. Other constipation Stable. No current symptoms. [name] was informed that a decrease in bowel movement frequency is normal while losing weight, but stools should not be hard or painful.  Follow-up with GI as scheduled.   Counseling Getting to Good Bowel Health: Your goal is to have one soft bowel movement each day. Drink at least 8 glasses of water each day. Eat plenty of fiber (goal is over 25 grams each day). It is best to get most of your fiber from dietary sources which includes leafy green vegetables, fresh fruit, and whole grains. You may need to add fiber with the help of OTC fiber supplements. These include Metamucil, Citrucel, and Flaxseed. If you are still having trouble, try adding Miralax or Magnesium Citrate. If all of these changes do not work, Cabin crew.  4. Vitamin D deficiency Low Vitamin D level contributes to fatigue and are associated with obesity, breast, and colon cancer. Continue OTC vitamin D.  Stable.  5. At risk for deficient intake of food Crystal Simmons was given approximately 10 minutes of deficient intake of food prevention counseling today. [name] is at risk for eating too few calories based on current food recall. She was encouraged to focus on meeting caloric and protein goals according to her recommended meal plan.  6. Obesity, current BMI 31.3  [name] is currently in the action stage of change. As such, her goal is to continue with weight loss efforts. She has  agreed to keeping a food journal and adhering to recommended goals of 1200-1300 calories and 85 grams of protein.   Exercise goals:  As is.  Behavioral modification strategies: increasing lean protein intake, decreasing simple carbohydrates, planning for success, and keeping a strict food journal.  Deepti has agreed to follow-up with our clinic in 4 weeks. She was informed of the importance of frequent follow-up visits to maximize her success with intensive lifestyle modifications for her multiple health conditions.   Objective:   Blood pressure 112/79, pulse 92, temperature 98 F (36.7 C), height '5\' 8"'$  (1.727 m), weight 206 lb (93.4 kg), last menstrual period 10/14/2005, SpO2 98 %. Body mass index is 31.32 kg/m.  General: Cooperative, alert, well developed, in no acute distress. HEENT: Conjunctivae and lids unremarkable. Cardiovascular: Regular rhythm.  Lungs: Normal work of breathing. Neurologic: No focal deficits.  Lab Results  Component Value Date   CREATININE 0.84 02/11/2021   BUN 16 02/11/2021   NA 138 02/11/2021   K 4.4 02/11/2021   CL 102 02/11/2021   CO2 23 02/11/2021   Lab Results  Component Value Date   ALT 19 08/18/2020   AST 19 08/18/2020   ALKPHOS 100 08/18/2020   BILITOT 0.5 08/18/2020   Lab Results  Component Value Date   HGBA1C 5.7 (H) 01/27/2021   HGBA1C 6.0 (H) 08/18/2020   HGBA1C 5.6 03/31/2020   HGBA1C 5.8 (H) 12/11/2019   HGBA1C 5.9 (H) 09/01/2019   Lab Results  Component Value Date   INSULIN 13.1 08/18/2020   INSULIN 6.3 03/31/2020   INSULIN 5.1 12/11/2019   INSULIN 11.0 09/01/2019   INSULIN 8.9 05/01/2019   Lab Results  Component Value Date   TSH 2.860 03/31/2020   Lab Results  Component Value Date   CHOL 167 03/31/2020   HDL 66 03/31/2020   LDLCALC 87 03/31/2020   TRIG 72 03/31/2020   CHOLHDL 2.5 03/31/2020   Lab Results  Component Value Date   VD25OH 49.1 08/18/2020   VD25OH 49.6 03/31/2020   VD25OH 78.8 12/11/2019    Lab Results  Component Value Date   WBC 6.6 03/31/2020   HGB 12.8 03/31/2020   HCT 38.6 03/31/2020   MCV 95 03/31/2020   PLT 300 03/31/2020   Lab Results  Component Value Date   IRON 93 03/31/2020   TIBC 264 03/31/2020   FERRITIN 65 03/31/2020   Attestation Statements:   Reviewed by clinician on day of visit: allergies, medications, problem list, medical history, surgical history, family history, social history, and previous encounter notes.  I, Water quality scientist, CMA, am acting as Location manager for [employer], DO.  I have reviewed the above documentation for accuracy and completeness, and I agree with the above. Marjory Sneddon, D.O.  The [place] was signed into law in 2016 which includes the topic of electronic health records.  This provides immediate access to information in MyChart.  This includes consultation notes, operative notes, office notes, lab results and pathology reports.  If you have any questions about what you read please let us know at your next visit so we can discuss your concerns and take corrective action if need be.  We are right here with you.

## 2021-04-22 ENCOUNTER — Encounter: Payer: Self-pay | Admitting: Orthopedic Surgery

## 2021-04-22 DIAGNOSIS — M1711 Unilateral primary osteoarthritis, right knee: Secondary | ICD-10-CM

## 2021-04-22 NOTE — Progress Notes (Signed)
Office Visit Note   Patient: Crystal Simmons           Date of Birth: May 03, 1960           MRN: [id number] Visit Date: 04/11/2021              Requested by: Kathyrn Lass, [place],  [place] [id number] PCP: Kathyrn Lass, MD  Chief Complaint  Patient presents with   Right Knee - Pain   Left Knee - Pain      HPI: Patient is a 61 year old woman who presents in follow-up for osteoarthritis bilateral knees.  She previously had a steroid injection in both knees February 17 last year.  She states she has been having increasing swelling in the right knee.  She has been using Voltaren gel with minimal relief.  Assessment & Plan: Visit Diagnoses:  1. Bilateral primary osteoarthritis of knee     Plan: Both knees were injected discussed the importance of decreasing BMI and increasing exercise.  Follow-Up Instructions: Return if symptoms worsen or fail to improve.   Ortho Exam  Patient is alert, oriented, no adenopathy, well-dressed, normal affect, normal respiratory effort. Examination patient has an antalgic gait she has tenderness to palpation in all 3 compartments of both knees collaterals and cruciates are stable there is a mild effusion of both knees.  Imaging: No results found. No images are attached to the encounter.  Labs: Lab Results  Component Value Date   HGBA1C 5.7 (H) 01/27/2021   HGBA1C 6.0 (H) 08/18/2020   HGBA1C 5.6 03/31/2020   LABORGA NO GROWTH 02/29/2016     Lab Results  Component Value Date   ALBUMIN 4.1 08/18/2020   ALBUMIN 4.1 03/31/2020   ALBUMIN 4.2 12/11/2019    Lab Results  Component Value Date   MG 1.8 02/11/2021   Lab Results  Component Value Date   VD25OH 49.1 08/18/2020   VD25OH 49.6 03/31/2020   VD25OH 78.8 12/11/2019    No results found for: PREALBUMIN CBC EXTENDED Latest Ref Rng & Units 03/31/2020 05/01/2019 01/15/2018  WBC 3.4 - 10.8 x10E3/uL 6.6 7.2 9.1  RBC 3.77 - 5.28 x10E6/uL 4.08 4.21 3.97  HGB 11.1 - 15.9  g/dL 12.8 13.0 12.2  HCT 34.0 - 46.6 % 38.6 40.0 37.3  PLT 150 - 450 x10E3/uL 300 340 316  NEUTROABS 1.4 - 7.0 x10E3/uL 3.1 4.1 -  LYMPHSABS 0.7 - 3.1 x10E3/uL 3.0 2.5 -     There is no height or weight on file to calculate BMI.  Orders:  Orders Placed This Encounter  Procedures   Large Joint Inj   XR Knee 1-2 Views Right   XR Knee 1-2 Views Left   No orders of the defined types were placed in this encounter.    Procedures: Large Joint Inj: bilateral knee on 04/22/2021 5:23 PM Indications: pain and diagnostic evaluation Details: 22 G 1.5 in needle, anteromedial approach  Arthrogram: No  Outcome: tolerated well, no immediate complications Procedure, treatment alternatives, risks and benefits explained, specific risks discussed. Consent was given by the patient. Immediately prior to procedure a time out was called to verify the correct patient, procedure, equipment, support staff and site/side marked as required. Patient was prepped and draped in the usual sterile fashion.     Clinical Data: No additional findings.  ROS:  All other systems negative, except as noted in the HPI. Review of Systems  Objective: Vital Signs: LMP 10/14/2005 (Approximate)   Specialty Comments:  No specialty  comments available.  PMFS History: Patient Active Problem List   Diagnosis Date Noted   Other insomnia 04/18/2021   Other constipation 04/18/2021   Vitamin D deficiency 04/18/2021   At risk for deficient intake of food 04/18/2021   Chronic idiopathic constipation 03/01/2020   Personal history of colonic polyps 03/01/2020   Prediabetes 11/27/2019   Insomnia 11/27/2019   Class 1 obesity with serious comorbidity and body mass index (BMI) of 30.0 to 30.9 in adult 11/27/2019   Bilateral primary osteoarthritis of knee 05/18/2016   Post-menopausal atrophic vaginitis 02/29/2016   Past Medical History:  Diagnosis Date   Arthritis    Back pain    CIN I (cervical intraepithelial  neoplasia I) 05/1990   Constipation    Endometriosis    Fibroids 10/95   GERD (gastroesophageal reflux disease)    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   Insomnia    Joint pain    Lactose intolerance    Menopausal state 2007   Seasonal allergies    Shingles 04/2018   STD (sexually transmitted disease) 8/09   chlamydia   Vitamin D deficiency disease 2010    Family History  Problem Relation Age of Onset   Hypertension Mother    Diabetes Father    Hypertension Father    Heart disease Father    Sudden death Father    Diabetes Brother    Diabetes Paternal Grandfather     Past Surgical History:  Procedure Laterality Date   BREAST SURGERY Left 6/03   Biopsy negative   CERVIX LESION DESTRUCTION  4/92   COLONOSCOPY W/ BIOPSIES  09/26/10   polyp benign   COLPOSCOPY  10/12   CIN I   KNEE SURGERY  12/2009   left for meniscal tear   LAPAROSCOPY  7/02   multiple surgeries, including LSO, LOA, and endometrioma, and fibroids secondary to endometriosis   UMBILICAL EXPLORATION  2/94   for endometrioma    Social History   Occupational History   Occupation: COLLECTION [employer]    Employer: [employer]  Tobacco Use   Smoking status: Never   Smokeless tobacco: Never  Vaping Use   Vaping Use: Never used  Substance and Sexual Activity   Alcohol use: Yes    Comment: 2-3 times a month   Drug use: No   Sexual activity: Yes    Partners: Male    Birth control/protection: Post-menopausal, None

## 2021-04-29 DIAGNOSIS — Z1211 Encounter for screening for malignant neoplasm of colon: Secondary | ICD-10-CM | POA: Diagnosis not present

## 2021-05-09 ENCOUNTER — Other Ambulatory Visit: Payer: Self-pay

## 2021-05-09 ENCOUNTER — Ambulatory Visit
Admission: RE | Admit: 2021-05-09 | Discharge: 2021-05-09 | Disposition: A | Discharge status: Home / Self Care | Payer: BC Managed Care – PPO | Source: Ambulatory Visit | Attending: Nurse Practitioner | Admitting: Nurse Practitioner

## 2021-05-09 DIAGNOSIS — Z1231 Encounter for screening mammogram for malignant neoplasm of breast: Secondary | ICD-10-CM | POA: Diagnosis not present

## 2021-05-14 ENCOUNTER — Other Ambulatory Visit (INDEPENDENT_AMBULATORY_CARE_PROVIDER_SITE_OTHER): Payer: Self-pay | Admitting: Family Medicine

## 2021-05-14 DIAGNOSIS — R7303 Prediabetes: Secondary | ICD-10-CM

## 2021-05-16 ENCOUNTER — Encounter (INDEPENDENT_AMBULATORY_CARE_PROVIDER_SITE_OTHER): Payer: Self-pay | Admitting: Physician Assistant

## 2021-05-16 ENCOUNTER — Other Ambulatory Visit (INDEPENDENT_AMBULATORY_CARE_PROVIDER_SITE_OTHER): Payer: Self-pay | Admitting: Physician Assistant

## 2021-05-16 ENCOUNTER — Ambulatory Visit (INDEPENDENT_AMBULATORY_CARE_PROVIDER_SITE_OTHER): Payer: BC Managed Care – PPO | Admitting: Physician Assistant

## 2021-05-16 VITALS — BP 118/79 | HR 85 | Temp 98.2°F | Ht 68.0 in | Wt 208.0 lb

## 2021-05-16 DIAGNOSIS — Z6831 Body mass index (BMI) 31.0-31.9, adult: Secondary | ICD-10-CM

## 2021-05-16 DIAGNOSIS — E669 Obesity, unspecified: Secondary | ICD-10-CM

## 2021-05-16 DIAGNOSIS — R7303 Prediabetes: Secondary | ICD-10-CM

## 2021-05-16 DIAGNOSIS — E559 Vitamin D deficiency, unspecified: Secondary | ICD-10-CM

## 2021-05-16 DIAGNOSIS — Z9189 Other specified personal risk factors, not elsewhere classified: Secondary | ICD-10-CM

## 2021-05-16 MED ORDER — METFORMIN HCL 500 MG PO TABS: ORAL_TABLET | ORAL | 0 refills | Status: DC

## 2021-05-17 LAB — COMPREHENSIVE METABOLIC PANEL
ALT: 15 IU/L (ref 0–32)
AST: 12 IU/L (ref 0–40)
Albumin/Globulin Ratio: 1.3 (ref 1.2–2.2)
Albumin: 4 g/dL (ref 3.8–4.9)
Alkaline Phosphatase: 104 IU/L (ref 44–121)
BUN/Creatinine Ratio: 12 (ref 12–28)
BUN: 11 mg/dL (ref 8–27)
Bilirubin Total: 0.6 mg/dL (ref 0.0–1.2)
CO2: 26 mmol/L (ref 20–29)
Calcium: 9.5 mg/dL (ref 8.7–10.3)
Chloride: 106 mmol/L (ref 96–106)
Creatinine, Ser: 0.89 mg/dL (ref 0.57–1.00)
Globulin, Total: 3 g/dL (ref 1.5–4.5)
Glucose: 98 mg/dL (ref 70–99)
Potassium: 4.6 mmol/L (ref 3.5–5.2)
Sodium: 145 mmol/L — ABNORMAL HIGH (ref 134–144)
Total Protein: 7 g/dL (ref 6.0–8.5)
eGFR: 74 mL/min/{1.73_m2} (ref >59)

## 2021-05-17 LAB — HEMOGLOBIN A1C
Est. average glucose Bld gHb Est-mCnc: 123 mg/dL
Hgb A1c MFr Bld: 5.9 % — ABNORMAL HIGH (ref 4.8–5.6)

## 2021-05-17 LAB — INSULIN, RANDOM: INSULIN: 10.9 u[IU]/mL (ref 2.6–24.9)

## 2021-05-17 LAB — VITAMIN D 25 HYDROXY (VIT D DEFICIENCY, FRACTURES): Vit D, 25-Hydroxy: 46.3 ng/mL (ref 30.0–100.0)

## 2021-05-17 NOTE — Progress Notes (Signed)
? ? ? ?Chief Complaint:  ? ?OBESITY ?Crystal Simmons is here to discuss her progress with her obesity treatment plan along with follow-up of her obesity related diagnoses. Crystal Simmons is on keeping a food journal and adhering to recommended goals of 1200-1300 calories and 85 grams of protein and states she is following her eating plan approximately 80% of the time. Crystal Simmons states she is doing cardio, elliptical, youtube videos, and weight training for 60 minutes 4 times per week. ? ?Today's visit was #: 20 ?Starting weight: 211 lbs ?Starting date: 05/01/2019 ?Today's weight: 208 lbs ?Today's date: 05/16/2021 ?Total lbs lost to date: 3 lbs ?Total lbs lost since last in-office visit: 0 ? ?Interim History: Sylena's left knee has been bothering her and she has not been exercising as much as normal. She just moved into a new office space and has been more stressed and snacking more often. When following plan she is averaging 1300 calories and getting 80 grams of protein daily.  ? ?Subjective:  ? ?1. Pre-diabetes ?Crystal Simmons is on Metformin. Her hunger is controlled. She reports she is so used to "doing without" that she is not hungry.  ? ?2. Vitamin D deficiency ?Crystal Simmons is currently on over the counter Vitamin D. Her energy is good.  ? ?3. At risk for diabetes mellitus ?Crystal Simmons is at higher than average risk for developing diabetes due to her obesity.  ? ?Assessment/Plan:  ? ?1. Pre-diabetes ?We will refill Metformin 500 mg for 1 month with no refills. We will check labs today. Lonie will continue to work on weight loss, exercise, and decreasing simple carbohydrates to help decrease the risk of diabetes.  ? ?- metFORMIN (GLUCOPHAGE) 500 MG tablet; 1 po q lunch daily  Dispense: 30 tablet; Refill: 0 ?- Comprehensive metabolic panel ?- Hemoglobin A1c ?- Insulin, random ? ?2. Vitamin D deficiency ?Low Vitamin D level contributes to fatigue and are associated with obesity, breast, and colon cancer. We will check Vitamin D today. Crystal Simmons will  continue over the counter Vitamin D and she will follow-up for routine testing of Vitamin D, at least 2-3 times per year to avoid over-replacement. ? ?- VITAMIN D 25 Hydroxy (Vit-D Deficiency, Fractures) ? ?3. At risk for diabetes mellitus ?Crystal Simmons was given approximately 15 minutes of diabetic education and counseling today. We discussed intensive lifestyle modifications today with an emphasis on weight loss as well as increasing exercise and decreasing simple carbohydrates in her diet. We also reviewed medication options with an emphasis on risk versus benefits of those discussed. ? ?Repetitive spaced learning was employed today to elicit superior memory formation and behavioral change.  ? ?4. Obesity, current BMI 31.63 ?Crystal Simmons is currently in the action stage of change. As such, her goal is to continue with weight loss efforts. She has agreed to keeping a food journal and adhering to recommended goals of 1200-1300 calories and 85 grams of protein daily.  ? ?Exercise goals:  Maley will increase strength training to 4 times per week. ? ?Behavioral modification strategies: increasing lean protein intake and meal planning and cooking strategies. ? ?Crystal Simmons has agreed to follow-up with our clinic in 4 weeks. She was informed of the importance of frequent follow-up visits to maximize her success with intensive lifestyle modifications for her multiple health conditions.  ? ?Crystal Simmons was informed we would discuss her lab results at her next visit unless there is a critical issue that needs to be addressed sooner. Crystal Simmons agreed to keep her next visit at the agreed upon time to  discuss these results. ? ?Objective:  ? ?Blood pressure 118/79, pulse 85, temperature 98.2 ?F (36.8 ?C), height '5\' 8"'$  (1.727 m), weight 208 lb (94.3 kg), last menstrual period 10/14/2005, SpO2 99 %. ?Body mass index is 31.63 kg/m?. ? ?General: Cooperative, alert, well developed, in no acute distress. ?HEENT: Conjunctivae and lids  unremarkable. ?Cardiovascular: Regular rhythm.  ?Lungs: Normal work of breathing. ?Neurologic: No focal deficits.  ? ?Lab Results  ?Component Value Date  ? CREATININE 0.84 02/11/2021  ? BUN 16 02/11/2021  ? NA 138 02/11/2021  ? K 4.4 02/11/2021  ? CL 102 02/11/2021  ? CO2 23 02/11/2021  ? ?Lab Results  ?Component Value Date  ? ALT 19 08/18/2020  ? AST 19 08/18/2020  ? ALKPHOS 100 08/18/2020  ? BILITOT 0.5 08/18/2020  ? ?Lab Results  ?Component Value Date  ? HGBA1C 5.7 (H) 01/27/2021  ? HGBA1C 6.0 (H) 08/18/2020  ? HGBA1C 5.6 03/31/2020  ? HGBA1C 5.8 (H) 12/11/2019  ? HGBA1C 5.9 (H) 09/01/2019  ? ?Lab Results  ?Component Value Date  ? INSULIN 13.1 08/18/2020  ? INSULIN 6.3 03/31/2020  ? INSULIN 5.1 12/11/2019  ? INSULIN 11.0 09/01/2019  ? INSULIN 8.9 05/01/2019  ? ?Lab Results  ?Component Value Date  ? TSH 2.860 03/31/2020  ? ?Lab Results  ?Component Value Date  ? CHOL 167 03/31/2020  ? HDL 66 03/31/2020  ? [place] 87 03/31/2020  ? TRIG 72 03/31/2020  ? CHOLHDL 2.5 03/31/2020  ? ?Lab Results  ?Component Value Date  ? VD25OH 49.1 08/18/2020  ? VD25OH 49.6 03/31/2020  ? VD25OH 78.8 12/11/2019  ? ?Lab Results  ?Component Value Date  ? WBC 6.6 03/31/2020  ? HGB 12.8 03/31/2020  ? HCT 38.6 03/31/2020  ? MCV 95 03/31/2020  ? PLT 300 03/31/2020  ? ?Lab Results  ?Component Value Date  ? IRON 93 03/31/2020  ? TIBC 264 03/31/2020  ? FERRITIN 65 03/31/2020  ? ?Attestation Statements:  ? ?Reviewed by clinician on day of visit: allergies, medications, problem list, medical history, surgical history, family history, social history, and previous encounter notes. ? ?I, Tonye Pearson, am acting as Location manager for [employer], PA-C. ? ?I have reviewed the above documentation for accuracy and completeness, and I agree with the above. Abby Potash, PA-C ? ?

## 2021-05-26 DIAGNOSIS — R252 Cramp and spasm: Secondary | ICD-10-CM | POA: Diagnosis not present

## 2021-05-26 DIAGNOSIS — Z0000 Encounter for general adult medical examination without abnormal findings: Secondary | ICD-10-CM | POA: Diagnosis not present

## 2021-05-26 DIAGNOSIS — Z23 Encounter for immunization: Secondary | ICD-10-CM | POA: Diagnosis not present

## 2021-06-08 ENCOUNTER — Telehealth: Payer: Self-pay | Admitting: Orthopedic Surgery

## 2021-06-08 NOTE — Telephone Encounter (Signed)
Pt called and states that her cortisone injection did not work and wondering if she can get something for the pain. ? ?CB [phone]  ?

## 2021-06-08 NOTE — Telephone Encounter (Signed)
Pt is s/p bilateral knee cortisone shots on 04/11/21 ?

## 2021-06-09 NOTE — Telephone Encounter (Signed)
Noted  

## 2021-06-09 NOTE — Telephone Encounter (Signed)
I called and sw pt to advise of message below. She would like to proceed with the bilateral knee gel injection for OA. Can we please order. Thanks! ?

## 2021-06-20 ENCOUNTER — Ambulatory Visit (INDEPENDENT_AMBULATORY_CARE_PROVIDER_SITE_OTHER): Payer: BC Managed Care – PPO | Admitting: Nurse Practitioner

## 2021-06-20 ENCOUNTER — Encounter (INDEPENDENT_AMBULATORY_CARE_PROVIDER_SITE_OTHER): Payer: Self-pay | Admitting: Nurse Practitioner

## 2021-06-20 ENCOUNTER — Encounter (INDEPENDENT_AMBULATORY_CARE_PROVIDER_SITE_OTHER): Payer: Self-pay

## 2021-06-20 ENCOUNTER — Telehealth (INDEPENDENT_AMBULATORY_CARE_PROVIDER_SITE_OTHER): Payer: Self-pay | Admitting: Nurse Practitioner

## 2021-06-20 VITALS — BP 121/82 | HR 81 | Temp 99.0°F | Ht 68.0 in | Wt 210.0 lb

## 2021-06-20 DIAGNOSIS — E669 Obesity, unspecified: Secondary | ICD-10-CM

## 2021-06-20 DIAGNOSIS — Z6832 Body mass index (BMI) 32.0-32.9, adult: Secondary | ICD-10-CM

## 2021-06-20 DIAGNOSIS — K5909 Other constipation: Secondary | ICD-10-CM | POA: Diagnosis not present

## 2021-06-20 DIAGNOSIS — G4709 Other insomnia: Secondary | ICD-10-CM | POA: Diagnosis not present

## 2021-06-20 DIAGNOSIS — R7303 Prediabetes: Secondary | ICD-10-CM

## 2021-06-20 DIAGNOSIS — G8929 Other chronic pain: Secondary | ICD-10-CM

## 2021-06-20 DIAGNOSIS — Z9189 Other specified personal risk factors, not elsewhere classified: Secondary | ICD-10-CM

## 2021-06-20 DIAGNOSIS — M25562 Pain in left knee: Secondary | ICD-10-CM | POA: Diagnosis not present

## 2021-06-20 MED ORDER — WEGOVY 0.25 MG/0.5ML ~~LOC~~ SOAJ: 0.2500 mg | SUBCUTANEOUS | 0 refills | Status: DC

## 2021-06-20 MED ORDER — METFORMIN HCL 500 MG PO TABS: ORAL_TABLET | ORAL | 0 refills | Status: DC

## 2021-06-20 NOTE — Telephone Encounter (Signed)
Col[place]ta [place] - Prior authorization approved for [employer]. Effective: 05/21/2021 to 01/16/2022. Patient sent approval message via mychart.  ?

## 2021-06-20 NOTE — Progress Notes (Signed)
? ? ? ?Chief Complaint:  ? ?OBESITY ?Crystal Simmons is here to discuss her progress with her obesity treatment plan along with follow-up of her obesity related diagnoses. Crystal Simmons is on keeping a food journal and adhering to recommended goals of 1200-1300 calories and 85 grams of protein and states she is following her eating plan approximately 80% of the time. Crystal Simmons states she is doing cardio, walking, and weights for  60 minutes 2 times per week. ? ?Today's visit was #: 21 ?Starting weight: 211 lbs ?Starting date: 05/01/2019 ?Today's weight: 210 lbs ?Today's date: 06/20/2021 ?Total lbs lost to date: 1 lb ?Total lbs lost since last in-office visit: 0 ? ?Interim History: Crystal Simmons is frustrated with weight gain. She started lifting heavier weights since her last visit. She is struggling with left knee pain and is seeing orthopedic. She is drinking 80 plus ounces of water daily. She denies sugary drinks. For breakfast she has oatmeal with protein powder, blueberries and yogurt. Lunch is Keto bread, protein. Snack: almonds and apple. Dinner: protein and vegetables. She reports struggling with hunger.  ? ?Subjective:  ? ?1. Pre-diabetes ?Crystal Simmons is taking Metformin 500 mg currently. She denies side effects. She reports polyphagia.  ? ?2. Other constipation ?Crystal Simmons has increased fiber intake and drinks 80 plus ounces of water daily. She has taken Miralax regular in the past.  ? ?3. Chronic pain of left knee ?Crystal Simmons has been seeing orthopedic for follow up for left knee pain.  ? ?4. Other insomnia ?Crystal Simmons is currently taking Lunesta 1 mg . She denies side effects.  ? ?5. At risk for side effect of medication ?Crystal Simmons is at risk for side effect of medication due to start of Wegovy.  ? ?Assessment/Plan:  ? ?1. Pre-diabetes ?Crystal Simmons will continue to work on weight loss, exercise, and decreasing simple carbohydrates to help decrease the risk of diabetes. We will refill Metformin 500 mg for 1 month with 1 refill. We discussed side effects.   ? ?- metFORMIN (GLUCOPHAGE) 500 MG tablet; 1 po q lunch daily  Dispense: 30 tablet; Refill: 0 ? ?2. Other constipation ?Crystal Simmons was informed that a decrease in bowel movement frequency is normal while losing weight, but stools should not be hard or painful. Crystal Simmons plans to restart Miralax and Colace. Orders and follow up as documented in patient record.  ? ?Counseling ?Getting to Good Bowel Health: Your goal is to have one soft bowel movement each day. Drink at least 8 glasses of water each day. Eat plenty of fiber (goal is over 25 grams each day). It is best to get most of your fiber from dietary sources which includes leafy green vegetables, fresh fruit, and whole grains. You may need to add fiber with the help of OTC fiber supplements. These include Metamucil, Citrucel, and Flaxseed. If you are still having trouble, try adding Miralax or Magnesium Citrate. If all of these changes do not work, Cabin crew.  ? ?3. Chronic pain of left knee ?Crystal Simmons will continue to follow up with orthopedic.  ? ?4. Other insomnia ?Will need to discuss refills of Lunesta with PCP.  Consider Trazodone in place of Lunesta.   ? ?5. At risk for side effect of medication ?Crystal Simmons was given approximately 15 minutes of drug side effect counseling today.  We discussed side effect possibility and risk versus benefits. Crystal Simmons agreed to the medication and will contact this office if these side effects are intolerable. ? ?Repetitive spaced learning was employed today to elicit superior memory formation and  behavioral change.  ? ?6. Obesity, current BMI 32.0 ?Crystal Simmons is currently in the action stage of change. As such, her goal is to continue with weight loss efforts. She has agreed to keeping a food journal and adhering to recommended goals of 1200-1300 calories and decrease protein to 70 grams due to constipation.  ? ?Crystal Simmons agrees to start taking Wegovy for 1 month with no refills. We discussed side effects.  ? ?- Semaglutide-Weight  Management (WEGOVY) 0.25 MG/0.5ML SOAJ; Inject 0.25 mg into the skin once a week.  Dispense: 2 mL; Refill: 0 ? ?Exercise goals:  As is.  ? ?Behavioral modification strategies: increasing water intake, no skipping meals, and meal planning and cooking strategies. ? ?Crystal Simmons has agreed to follow-up with our clinic in 3 weeks. She was informed of the importance of frequent follow-up visits to maximize her success with intensive lifestyle modifications for her multiple health conditions.  ? ?Objective:  ? ?Blood pressure 121/82, pulse 81, temperature 99 ?F (37.2 ?C), height '5\' 8"'$  (1.727 m), weight 210 lb (95.3 kg), last menstrual period 10/14/2005, SpO2 98 %. ?Body mass index is 31.93 kg/m?. ? ?General: Cooperative, alert, well developed, in no acute distress. ?HEENT: Conjunctivae and lids unremarkable. ?Cardiovascular: Regular rhythm.  ?Lungs: Normal work of breathing. ?Neurologic: No focal deficits.  ? ?Lab Results  ?Component Value Date  ? CREATININE 0.89 05/16/2021  ? BUN 11 05/16/2021  ? NA 145 (H) 05/16/2021  ? K 4.6 05/16/2021  ? CL 106 05/16/2021  ? CO2 26 05/16/2021  ? ?Lab Results  ?Component Value Date  ? ALT 15 05/16/2021  ? AST 12 05/16/2021  ? ALKPHOS 104 05/16/2021  ? BILITOT 0.6 05/16/2021  ? ?Lab Results  ?Component Value Date  ? HGBA1C 5.9 (H) 05/16/2021  ? HGBA1C 5.7 (H) 01/27/2021  ? HGBA1C 6.0 (H) 08/18/2020  ? HGBA1C 5.6 03/31/2020  ? HGBA1C 5.8 (H) 12/11/2019  ? ?Lab Results  ?Component Value Date  ? INSULIN 10.9 05/16/2021  ? INSULIN 13.1 08/18/2020  ? INSULIN 6.3 03/31/2020  ? INSULIN 5.1 12/11/2019  ? INSULIN 11.0 09/01/2019  ? ?Lab Results  ?Component Value Date  ? TSH 2.860 03/31/2020  ? ?Lab Results  ?Component Value Date  ? CHOL 167 03/31/2020  ? HDL 66 03/31/2020  ? [place] 87 03/31/2020  ? TRIG 72 03/31/2020  ? CHOLHDL 2.5 03/31/2020  ? ?Lab Results  ?Component Value Date  ? VD25OH 46.3 05/16/2021  ? VD25OH 49.1 08/18/2020  ? VD25OH 49.6 03/31/2020  ? ?Lab Results  ?Component Value Date  ?  WBC 6.6 03/31/2020  ? HGB 12.8 03/31/2020  ? HCT 38.6 03/31/2020  ? MCV 95 03/31/2020  ? PLT 300 03/31/2020  ? ?Lab Results  ?Component Value Date  ? IRON 93 03/31/2020  ? TIBC 264 03/31/2020  ? FERRITIN 65 03/31/2020  ? ?Attestation Statements:  ? ?Reviewed by clinician on day of visit: allergies, medications, problem list, medical history, surgical history, family history, social history, and previous encounter notes. ? ?I, Lizbeth Bark, RMA, am acting as Location manager for Everardo Pacific, FNP. ? ?I have reviewed the above documentation for accuracy and completeness, and I agree with the above. Everardo Pacific, FNP  ?

## 2021-07-18 ENCOUNTER — Encounter (INDEPENDENT_AMBULATORY_CARE_PROVIDER_SITE_OTHER): Payer: Self-pay | Admitting: Nurse Practitioner

## 2021-07-18 ENCOUNTER — Ambulatory Visit (INDEPENDENT_AMBULATORY_CARE_PROVIDER_SITE_OTHER): Payer: BC Managed Care – PPO | Admitting: Nurse Practitioner

## 2021-07-18 VITALS — BP 112/78 | HR 85 | Temp 99.3°F | Ht 68.0 in | Wt 206.0 lb

## 2021-07-18 DIAGNOSIS — E669 Obesity, unspecified: Secondary | ICD-10-CM

## 2021-07-18 DIAGNOSIS — R7303 Prediabetes: Secondary | ICD-10-CM

## 2021-07-18 DIAGNOSIS — Z6831 Body mass index (BMI) 31.0-31.9, adult: Secondary | ICD-10-CM | POA: Diagnosis not present

## 2021-07-18 MED ORDER — METFORMIN HCL 500 MG PO TABS: ORAL_TABLET | ORAL | 0 refills | Status: DC

## 2021-07-18 MED ORDER — WEGOVY 0.5 MG/0.5ML ~~LOC~~ SOAJ: 0.5000 mg | SUBCUTANEOUS | 0 refills | Status: DC

## 2021-07-19 NOTE — Progress Notes (Unsigned)
Chief Complaint:   OBESITY Ida is here to discuss her progress with her obesity treatment plan along with follow-up of her obesity related diagnoses. Janay is on keeping a food journal and adhering to recommended goals of 1200-1300 calories and 70 grams of  protein and states she is following her eating plan approximately 80% of the time. Allix states she is doing [employer] for 30 minutes 4-5 times per week.  Today's visit was #: 22 Starting weight: 211 lbs Starting date: 05/01/2019 Today's weight: 206 lbs Today's date: 07/18/2021 Total lbs lost to date: 5 lbs Total lbs lost since last in-office visit: 4 lbs  Interim History: Shanette has done well with weight loss since her last visit. She is taking Wegovy 0.25 mg. She reports fatigue for the first 2 weeks. Fatigue has gotten better this week.  She is doing well and is  meeting calories and protein goals. She is still struggling with hunger and cravings.   Subjective:   1. Pre-diabetes Karmah is taking Metformin 500 mg. She denies side effects. She reports hunger and cravings.   Assessment/Plan:   1. Pre-diabetes Grayce will continue to work on weight loss, exercise, and decreasing simple carbohydrates to help decrease the risk of diabetes. We will refill Metformin 500 mg daily for 1 month with no refills . We discussed side effects.   - metFORMIN (GLUCOPHAGE) 500 MG tablet; 1 po q lunch daily  Dispense: 30 tablet; Refill: 0  2. Obesity, current BMI 31.3 Raigan is currently in the action stage of change. As such, her goal is to continue with weight loss efforts. She has agreed to keeping a food journal and adhering to recommended goals of 1200-1300 calories and 75 plus grams of protein.   Yudith will continue Wegovy. Millianna agrees to increase to 0.5 mg for 1 month with no refills. We discussed side effects.   - Semaglutide-Weight Management (WEGOVY) 0.5 MG/0.5ML SOAJ; Inject 0.5 mg into the skin once a week.  Dispense: 2  mL; Refill: 0  Exercise goals:  As is.   Behavioral modification strategies: increasing lean protein intake, increasing water intake, and no skipping meals.  Sayda has agreed to follow-up with our clinic in 3 weeks. She was informed of the importance of frequent follow-up visits to maximize her success with intensive lifestyle modifications for her multiple health conditions.   Objective:   Blood pressure 112/78, pulse 85, temperature 99.3 F (37.4 C), height '5\' 8"'$  (1.727 m), weight 206 lb (93.4 kg), last menstrual period 10/14/2005, SpO2 98 %. Body mass index is 31.32 kg/m.  General: Cooperative, alert, well developed, in no acute distress. HEENT: Conjunctivae and lids unremarkable. Cardiovascular: Regular rhythm.  Lungs: Normal work of breathing. Neurologic: No focal deficits.   Lab Results  Component Value Date   CREATININE 0.89 05/16/2021   BUN 11 05/16/2021   NA 145 (H) 05/16/2021   K 4.6 05/16/2021   CL 106 05/16/2021   CO2 26 05/16/2021   Lab Results  Component Value Date   ALT 15 05/16/2021   AST 12 05/16/2021   ALKPHOS 104 05/16/2021   BILITOT 0.6 05/16/2021   Lab Results  Component Value Date   HGBA1C 5.9 (H) 05/16/2021   HGBA1C 5.7 (H) 01/27/2021   HGBA1C 6.0 (H) 08/18/2020   HGBA1C 5.6 03/31/2020   HGBA1C 5.8 (H) 12/11/2019   Lab Results  Component Value Date   INSULIN 10.9 05/16/2021   INSULIN 13.1 08/18/2020   INSULIN 6.3 03/31/2020  INSULIN 5.1 12/11/2019   INSULIN 11.0 09/01/2019   Lab Results  Component Value Date   TSH 2.860 03/31/2020   Lab Results  Component Value Date   CHOL 167 03/31/2020   HDL 66 03/31/2020   LDLCALC 87 03/31/2020   TRIG 72 03/31/2020   CHOLHDL 2.5 03/31/2020   Lab Results  Component Value Date   VD25OH 46.3 05/16/2021   VD25OH 49.1 08/18/2020   VD25OH 49.6 03/31/2020   Lab Results  Component Value Date   WBC 6.6 03/31/2020   HGB 12.8 03/31/2020   HCT 38.6 03/31/2020   MCV 95 03/31/2020   PLT 300  03/31/2020   Lab Results  Component Value Date   IRON 93 03/31/2020   TIBC 264 03/31/2020   FERRITIN 65 03/31/2020   Attestation Statements:   Reviewed by clinician on day of visit: allergies, medications, problem list, medical history, surgical history, family history, social history, and previous encounter notes.  I, Lizbeth Bark, RMA, am acting as Location manager for [employer], DO.  I have reviewed the above documentation for accuracy and completeness, and I agree with the above. Everardo Pacific, FNP

## 2021-08-15 ENCOUNTER — Ambulatory Visit (INDEPENDENT_AMBULATORY_CARE_PROVIDER_SITE_OTHER): Payer: BC Managed Care – PPO | Admitting: Nurse Practitioner

## 2021-08-15 ENCOUNTER — Encounter (INDEPENDENT_AMBULATORY_CARE_PROVIDER_SITE_OTHER): Payer: Self-pay | Admitting: Nurse Practitioner

## 2021-08-15 VITALS — BP 113/78 | HR 90 | Temp 98.5°F | Ht 68.0 in | Wt 209.0 lb

## 2021-08-15 DIAGNOSIS — Z6831 Body mass index (BMI) 31.0-31.9, adult: Secondary | ICD-10-CM

## 2021-08-15 DIAGNOSIS — R7303 Prediabetes: Secondary | ICD-10-CM

## 2021-08-15 DIAGNOSIS — E669 Obesity, unspecified: Secondary | ICD-10-CM

## 2021-08-15 MED ORDER — METFORMIN HCL 500 MG PO TABS: ORAL_TABLET | ORAL | 0 refills | Status: DC

## 2021-08-15 MED ORDER — SAXENDA 18 MG/3ML ~~LOC~~ SOPN: 3.0000 mg | PEN_INJECTOR | Freq: Every day | SUBCUTANEOUS | 0 refills | Status: DC

## 2021-08-15 MED ORDER — INSULIN PEN NEEDLE 31G X 5 MM MISC: 0 refills | Status: DC

## 2021-08-16 NOTE — Progress Notes (Unsigned)
Chief Complaint:   OBESITY Crystal Simmons is here to discuss her progress with her obesity treatment plan along with follow-up of her obesity related diagnoses. Crystal Simmons is on keeping a food journal and adhering to recommended goals of 1200-1300 calories and 75+ grams of protein daily and states she is following her eating plan approximately 70% of the time. Crystal Simmons states she is doing cardio and Youtube exercises for 30 minutes 5 times per week.  Today's visit was #: 23 Starting weight: 211 lbs Starting date: 05/01/2019 Today's weight: 209 lbs Today's date: 08/15/2021 Total lbs lost to date: 2 Total lbs lost since last in-office visit: 0  Interim History: Crystal Simmons is frustrated with her weight gain, and she has not been able to find Wegovy due to shortage.  She reports that this month has not been good.  She checked out over the past month.  She started meal prepping this week, and she eats out 1 time per week.  On days she is tracking, she is averaging between 80-100 g of protein.  She reports hunger and cravings.  Subjective:   1. Pre-diabetes Crystal Simmons is taking metformin 500 mg daily, and she denies side effects.  She reports hunger and cravings.  Assessment/Plan:   1. Pre-diabetes Crystal Simmons will continue metformin 500 mg once daily with lunch, and we will refill for 1 month.  - metFORMIN (GLUCOPHAGE) 500 MG tablet; 1 po q lunch daily  Dispense: 30 tablet; Refill: 0  2. Obesity, current BMI 31.9 Crystal Simmons is currently in the action stage of change. As such, her goal is to continue with weight loss efforts. She has agreed to keeping a food journal and adhering to recommended goals of 1200 calories and 75+ grams of protein daily.   We will recheck IC at her next visit.  We discussed various medication options to help Crystal Simmons with her weight loss efforts and we both agreed to start Saxenda at 0.6 mg for 1 week and then increase to 1.2 mg daily, with no refills; pen needles #100 with no refills.  Side  effects were discussed.  - Liraglutide -Weight Management (SAXENDA) 18 MG/3ML SOPN; Inject 3 mg into the skin daily.  Dispense: 15 mL; Refill: 0 - Insulin Pen Needle 31G X 5 MM MISC; Take as directed with Saxenda  Dispense: 100 each; Refill: 0  Exercise goals: As is.  Behavioral modification strategies: increasing lean protein intake, increasing water intake, and planning for success.  Crystal Simmons has agreed to follow-up with our clinic in 4 weeks. She was informed of the importance of frequent follow-up visits to maximize her success with intensive lifestyle modifications for her multiple health conditions.   Crystal Simmons was informed we would discuss her lab results at her next visit unless there is a critical issue that needs to be addressed sooner. Crystal Simmons agreed to keep her next visit at the agreed upon time to discuss these results.  Objective:   Blood pressure 113/78, pulse 90, temperature 98.5 F (36.9 C), height '5\' 8"'$  (1.727 m), weight 209 lb (94.8 kg), last menstrual period 10/14/2005, SpO2 99 %. Body mass index is 31.78 kg/m.  General: Cooperative, alert, well developed, in no acute distress. HEENT: Conjunctivae and lids unremarkable. Cardiovascular: Regular rhythm.  Lungs: Normal work of breathing. Neurologic: No focal deficits.   Lab Results  Component Value Date   CREATININE 0.89 05/16/2021   BUN 11 05/16/2021   NA 145 (H) 05/16/2021   K 4.6 05/16/2021   CL 106 05/16/2021  CO2 26 05/16/2021   Lab Results  Component Value Date   ALT 15 05/16/2021   AST 12 05/16/2021   ALKPHOS 104 05/16/2021   BILITOT 0.6 05/16/2021   Lab Results  Component Value Date   HGBA1C 5.9 (H) 05/16/2021   HGBA1C 5.7 (H) 01/27/2021   HGBA1C 6.0 (H) 08/18/2020   HGBA1C 5.6 03/31/2020   HGBA1C 5.8 (H) 12/11/2019   Lab Results  Component Value Date   INSULIN 10.9 05/16/2021   INSULIN 13.1 08/18/2020   INSULIN 6.3 03/31/2020   INSULIN 5.1 12/11/2019   INSULIN 11.0 09/01/2019   Lab  Results  Component Value Date   TSH 2.860 03/31/2020   Lab Results  Component Value Date   CHOL 167 03/31/2020   HDL 66 03/31/2020   LDLCALC 87 03/31/2020   TRIG 72 03/31/2020   CHOLHDL 2.5 03/31/2020   Lab Results  Component Value Date   VD25OH 46.3 05/16/2021   VD25OH 49.1 08/18/2020   VD25OH 49.6 03/31/2020   Lab Results  Component Value Date   WBC 6.6 03/31/2020   HGB 12.8 03/31/2020   HCT 38.6 03/31/2020   MCV 95 03/31/2020   PLT 300 03/31/2020   Lab Results  Component Value Date   IRON 93 03/31/2020   TIBC 264 03/31/2020   FERRITIN 65 03/31/2020   Attestation Statements:   Reviewed by clinician on day of visit: allergies, medications, problem list, medical history, surgical history, family history, social history, and previous encounter notes.   Wilhemena Durie, am acting as Location manager for [employer], FNP-C.  I have reviewed the above documentation for accuracy and completeness, and I agree with the above. Everardo Pacific, FNP

## 2021-08-17 ENCOUNTER — Encounter (INDEPENDENT_AMBULATORY_CARE_PROVIDER_SITE_OTHER): Payer: Self-pay

## 2021-08-17 ENCOUNTER — Telehealth (INDEPENDENT_AMBULATORY_CARE_PROVIDER_SITE_OTHER): Payer: Self-pay | Admitting: Nurse Practitioner

## 2021-08-17 NOTE — Telephone Encounter (Signed)
Crystal Simmons - Prior authorization approved for Saxenda. Effective: 07/16/2021 - 12/13/2021. Patient sent approval message via mychart.

## 2021-09-12 ENCOUNTER — Ambulatory Visit (INDEPENDENT_AMBULATORY_CARE_PROVIDER_SITE_OTHER): Payer: BC Managed Care – PPO | Admitting: Nurse Practitioner

## 2021-09-21 ENCOUNTER — Encounter (INDEPENDENT_AMBULATORY_CARE_PROVIDER_SITE_OTHER): Payer: Self-pay

## 2021-09-28 ENCOUNTER — Other Ambulatory Visit (INDEPENDENT_AMBULATORY_CARE_PROVIDER_SITE_OTHER): Payer: Self-pay | Admitting: Nurse Practitioner

## 2021-09-28 DIAGNOSIS — R7303 Prediabetes: Secondary | ICD-10-CM

## 2021-09-29 ENCOUNTER — Telehealth (INDEPENDENT_AMBULATORY_CARE_PROVIDER_SITE_OTHER): Payer: Self-pay | Admitting: Nurse Practitioner

## 2021-09-29 ENCOUNTER — Encounter (INDEPENDENT_AMBULATORY_CARE_PROVIDER_SITE_OTHER): Payer: Self-pay | Admitting: Nurse Practitioner

## 2021-09-29 DIAGNOSIS — R7303 Prediabetes: Secondary | ICD-10-CM

## 2021-09-29 NOTE — Telephone Encounter (Signed)
Pt concerned of 7 days without metformin. Pt had covid on her last appointment and had to cancel. Pt next appt is 8/24.

## 2021-10-03 ENCOUNTER — Other Ambulatory Visit (INDEPENDENT_AMBULATORY_CARE_PROVIDER_SITE_OTHER): Payer: Self-pay | Admitting: Nurse Practitioner

## 2021-10-03 DIAGNOSIS — R7303 Prediabetes: Secondary | ICD-10-CM

## 2021-10-03 MED ORDER — METFORMIN HCL 500 MG PO TABS: ORAL_TABLET | ORAL | 0 refills | Status: DC

## 2021-10-03 NOTE — Telephone Encounter (Signed)
Concerns will be discussed at next appointment 10/06/2021.

## 2021-10-06 ENCOUNTER — Ambulatory Visit (INDEPENDENT_AMBULATORY_CARE_PROVIDER_SITE_OTHER): Payer: BC Managed Care – PPO | Admitting: Nurse Practitioner

## 2021-10-06 ENCOUNTER — Encounter (INDEPENDENT_AMBULATORY_CARE_PROVIDER_SITE_OTHER): Payer: Self-pay | Admitting: Nurse Practitioner

## 2021-10-06 VITALS — BP 108/74 | HR 77 | Temp 98.3°F | Ht 68.0 in | Wt 213.0 lb

## 2021-10-06 DIAGNOSIS — E669 Obesity, unspecified: Secondary | ICD-10-CM

## 2021-10-06 DIAGNOSIS — Z6832 Body mass index (BMI) 32.0-32.9, adult: Secondary | ICD-10-CM | POA: Diagnosis not present

## 2021-10-06 DIAGNOSIS — R0602 Shortness of breath: Secondary | ICD-10-CM

## 2021-10-13 NOTE — Progress Notes (Signed)
Chief Complaint:   OBESITY Crystal Simmons is here to discuss her progress with her obesity treatment plan along with follow-up of her obesity related diagnoses. Crystal Simmons is on keeping a food journal and adhering to recommended goals of 1200 calories and 75 protein and states she is following her eating plan approximately 60% of the time. Crystal Simmons states she is cardio, you tube videos, elliptical  30 minutes 4 times per week.  Today's visit was #: 24 Starting weight: 211 lbs Starting date: 05/01/2019 Today's weight: 213 lbs Today's date: 10/06/2021 Total lbs lost to date: 0 Total lbs lost since last in-office visit: +4 lbs  Interim History: Crystal Simmons off [employer].  Frustrated with weight gain.  Stressed because she cannot find [place] or Saxenda at [employer].  Started tracking again this week.  Calories 1254, 1293.  Protein 32, 135.  Carbs 124.  Started weighing and measuring food this week.  Drinking water daily.  Plans to call multiple pharmacies to see if she can find [place].    Subjective:   1. SOB (shortness of breath) on exertion IC 05/01/2019:  6629 IC 05/10/2020:  1261 IC 10/06/2021:  1296  Assessment/Plan:   1. SOB (shortness of breath) on exertion Worse from initial visit.  Results reviewed with Crystal Simmons.   2. Obesity, current BMI 32.4 Crystal Simmons is currently in the action stage of change. As such, her goal is to continue with weight loss efforts. She has agreed to keeping a food journal and adhering to recommended goals of 1000 calories and 90+ protein.   Exercise goals:  As is.   Behavioral modification strategies: increasing lean protein intake, increasing vegetables, and increasing water intake.  Crystal Simmons has agreed to follow-up with our clinic in 4 weeks. She was informed of the importance of frequent follow-up visits to maximize her success with intensive lifestyle modifications for her multiple health conditions.   Objective:   Blood pressure 108/74, pulse 77, temperature  98.3 F (36.8 C), height '5\' 8"'$  (1.727 m), weight 213 lb (96.6 kg), last menstrual period 10/14/2005, SpO2 97 %. Body mass index is 32.39 kg/m.  General: Cooperative, alert, well developed, in no acute distress. HEENT: Conjunctivae and lids unremarkable. Cardiovascular: Regular rhythm.  Lungs: Normal work of breathing. Neurologic: No focal deficits.   Lab Results  Component Value Date   CREATININE 0.89 05/16/2021   BUN 11 05/16/2021   NA 145 (H) 05/16/2021   K 4.6 05/16/2021   CL 106 05/16/2021   CO2 26 05/16/2021   Lab Results  Component Value Date   ALT 15 05/16/2021   AST 12 05/16/2021   ALKPHOS 104 05/16/2021   BILITOT 0.6 05/16/2021   Lab Results  Component Value Date   HGBA1C 5.9 (H) 05/16/2021   HGBA1C 5.7 (H) 01/27/2021   HGBA1C 6.0 (H) 08/18/2020   HGBA1C 5.6 03/31/2020   HGBA1C 5.8 (H) 12/11/2019   Lab Results  Component Value Date   INSULIN 10.9 05/16/2021   INSULIN 13.1 08/18/2020   INSULIN 6.3 03/31/2020   INSULIN 5.1 12/11/2019   INSULIN 11.0 09/01/2019   Lab Results  Component Value Date   TSH 2.860 03/31/2020   Lab Results  Component Value Date   CHOL 167 03/31/2020   HDL 66 03/31/2020   LDLCALC 87 03/31/2020   TRIG 72 03/31/2020   CHOLHDL 2.5 03/31/2020   Lab Results  Component Value Date   VD25OH 46.3 05/16/2021   VD25OH 49.1 08/18/2020   VD25OH 49.6 03/31/2020   Lab  Results  Component Value Date   WBC 6.6 03/31/2020   HGB 12.8 03/31/2020   HCT 38.6 03/31/2020   MCV 95 03/31/2020   PLT 300 03/31/2020   Lab Results  Component Value Date   IRON 93 03/31/2020   TIBC 264 03/31/2020   FERRITIN 65 03/31/2020   Attestation Statements:   Reviewed by clinician on day of visit: allergies, medications, problem list, medical history, surgical history, family history, social history, and previous encounter notes.  I spent 30  minutes with the patient and reviewing the chart before and after visit.   I, Davy Pique, RMA, am acting  as transcriptionist for Everardo Pacific, FNP  I have reviewed the above documentation for accuracy and completeness, and I agree with the above. Everardo Pacific, FNP

## 2021-11-08 ENCOUNTER — Ambulatory Visit (INDEPENDENT_AMBULATORY_CARE_PROVIDER_SITE_OTHER): Payer: BC Managed Care – PPO | Admitting: Family Medicine

## 2021-11-14 DIAGNOSIS — H524 Presbyopia: Secondary | ICD-10-CM | POA: Diagnosis not present

## 2021-12-14 DIAGNOSIS — H35413 Lattice degeneration of retina, bilateral: Secondary | ICD-10-CM | POA: Diagnosis not present

## 2021-12-19 ENCOUNTER — Encounter (INDEPENDENT_AMBULATORY_CARE_PROVIDER_SITE_OTHER): Payer: Self-pay | Admitting: Family Medicine

## 2021-12-19 ENCOUNTER — Ambulatory Visit (INDEPENDENT_AMBULATORY_CARE_PROVIDER_SITE_OTHER): Payer: BC Managed Care – PPO | Admitting: Family Medicine

## 2021-12-19 VITALS — BP 120/79 | HR 90 | Temp 98.4°F | Ht 68.0 in | Wt 198.0 lb

## 2021-12-19 DIAGNOSIS — E559 Vitamin D deficiency, unspecified: Secondary | ICD-10-CM | POA: Diagnosis not present

## 2021-12-19 DIAGNOSIS — Z6830 Body mass index (BMI) 30.0-30.9, adult: Secondary | ICD-10-CM

## 2021-12-19 DIAGNOSIS — R7303 Prediabetes: Secondary | ICD-10-CM | POA: Diagnosis not present

## 2021-12-19 DIAGNOSIS — E669 Obesity, unspecified: Secondary | ICD-10-CM | POA: Diagnosis not present

## 2021-12-21 LAB — COMPREHENSIVE METABOLIC PANEL
ALT: 15 IU/L (ref 0–32)
AST: 16 IU/L (ref 0–40)
Albumin/Globulin Ratio: 1.3 (ref 1.2–2.2)
Albumin: 4.2 g/dL (ref 3.9–4.9)
Alkaline Phosphatase: 96 IU/L (ref 44–121)
BUN/Creatinine Ratio: 13 (ref 12–28)
BUN: 13 mg/dL (ref 8–27)
Bilirubin Total: 0.5 mg/dL (ref 0.0–1.2)
CO2: 22 mmol/L (ref 20–29)
Calcium: 9.7 mg/dL (ref 8.7–10.3)
Chloride: 104 mmol/L (ref 96–106)
Creatinine, Ser: 0.97 mg/dL (ref 0.57–1.00)
Globulin, Total: 3.2 g/dL (ref 1.5–4.5)
Glucose: 106 mg/dL — ABNORMAL HIGH (ref 70–99)
Potassium: 4.3 mmol/L (ref 3.5–5.2)
Sodium: 140 mmol/L (ref 134–144)
Total Protein: 7.4 g/dL (ref 6.0–8.5)
eGFR: 66 mL/min/{1.73_m2} (ref >59)

## 2021-12-21 LAB — INSULIN, RANDOM: INSULIN: 13.2 u[IU]/mL (ref 2.6–24.9)

## 2021-12-21 LAB — VITAMIN D 25 HYDROXY (VIT D DEFICIENCY, FRACTURES): Vit D, 25-Hydroxy: 59 ng/mL (ref 30.0–100.0)

## 2021-12-21 LAB — HEMOGLOBIN A1C
Est. average glucose Bld gHb Est-mCnc: 117 mg/dL
Hgb A1c MFr Bld: 5.7 % — ABNORMAL HIGH (ref 4.8–5.6)

## 2021-12-22 ENCOUNTER — Encounter (INDEPENDENT_AMBULATORY_CARE_PROVIDER_SITE_OTHER): Payer: Self-pay

## 2021-12-26 NOTE — Progress Notes (Signed)
Chief Complaint:   OBESITY Crystal Simmons is here to discuss her progress with her obesity treatment plan along with follow-up of her obesity related diagnoses. Crystal Simmons is on keeping a food journal and adhering to recommended goals of 1000 calories and 90+ grams of protein and states she is following her eating plan approximately 95% of the time. Crystal Simmons states she is walking/Youtube 45 minutes 4-5 times per week.  Today's visit was #: 25 Starting weight: 211 lbs Starting date: 05/01/2019 Today's weight: 198 lbs Today's date: 12/19/2021 Total lbs lost to date: 13 lbs Total lbs lost since last in-office visit: 15  Interim History: Crystal Simmons has been 1200-1300 calories and protein at 100 grams a day on average since starting intermittent fasting. Drinking green tea in the am. Cutting her eating off at 8pm. Not sure what Thanksgiving looks like for her.  Subjective:   1. Prediabetes Brinnley's last A1c was 5.8, Insulin was 10.9. She is on Metformin 500 mg once a day. Better carb intake control with IF.  2. Vitamin D deficiency Crystal Simmons is currently taking over the counter Vit D. Denies any nausea, vomiting or muscle weakness. She notes fatigue.   Assessment/Plan:   1. Prediabetes We will obtain labs today. We will refill Metformin 500 mg by mouth daily for 1 month with 0 refills.  - Hemoglobin A1c - Insulin, random - Comprehensive metabolic panel  2. Vitamin D deficiency We will obtain labs today. Continue taking over the counter Vit D.   - VITAMIN D 25 Hydroxy (Vit-D Deficiency, Fractures)  4. Obesity, current BMI 30.2 Crystal Simmons is currently in the action stage of change. As such, her goal is to continue with weight loss efforts. She has agreed to keeping a food journal and adhering to recommended goals of 1200 calories and 85+ grams of protein daily.   Exercise goals: As is.  Behavioral modification strategies: increasing lean protein intake, meal planning and cooking strategies, keeping  healthy foods in the home, planning for success, and keeping a strict food journal.  Crystal Simmons has agreed to follow-up with our clinic in 6 weeks. She was informed of the importance of frequent follow-up visits to maximize her success with intensive lifestyle modifications for her multiple health conditions.   Crystal Simmons was informed we would discuss her lab results at her next visit unless there is a critical issue that needs to be addressed sooner. Crystal Simmons agreed to keep her next visit at the agreed upon time to discuss these results.  Objective:   Blood pressure 120/79, pulse 90, temperature 98.4 F (36.9 C), height '5\' 8"'$  (1.727 m), weight 198 lb (89.8 kg), last menstrual period 10/14/2005, SpO2 99 %. Body mass index is 30.11 kg/m.  General: Cooperative, alert, well developed, in no acute distress. HEENT: Conjunctivae and lids unremarkable. Cardiovascular: Regular rhythm.  Lungs: Normal work of breathing. Neurologic: No focal deficits.   Lab Results  Component Value Date   CREATININE 0.97 12/19/2021   BUN 13 12/19/2021   NA 140 12/19/2021   K 4.3 12/19/2021   CL 104 12/19/2021   CO2 22 12/19/2021   Lab Results  Component Value Date   ALT 15 12/19/2021   AST 16 12/19/2021   ALKPHOS 96 12/19/2021   BILITOT 0.5 12/19/2021   Lab Results  Component Value Date   HGBA1C 5.7 (H) 12/19/2021   HGBA1C 5.9 (H) 05/16/2021   HGBA1C 5.7 (H) 01/27/2021   HGBA1C 6.0 (H) 08/18/2020   HGBA1C 5.6 03/31/2020   Lab Results  Component Value Date   INSULIN 13.2 12/19/2021   INSULIN 10.9 05/16/2021   INSULIN 13.1 08/18/2020   INSULIN 6.3 03/31/2020   INSULIN 5.1 12/11/2019   Lab Results  Component Value Date   TSH 2.860 03/31/2020   Lab Results  Component Value Date   CHOL 167 03/31/2020   HDL 66 03/31/2020   LDLCALC 87 03/31/2020   TRIG 72 03/31/2020   CHOLHDL 2.5 03/31/2020   Lab Results  Component Value Date   VD25OH 59.0 12/19/2021   VD25OH 46.3 05/16/2021   VD25OH 49.1  08/18/2020   Lab Results  Component Value Date   WBC 6.6 03/31/2020   HGB 12.8 03/31/2020   HCT 38.6 03/31/2020   MCV 95 03/31/2020   PLT 300 03/31/2020   Lab Results  Component Value Date   IRON 93 03/31/2020   TIBC 264 03/31/2020   FERRITIN 65 03/31/2020   Attestation Statements:   Reviewed by clinician on day of visit: allergies, medications, problem list, medical history, surgical history, family history, social history, and previous encounter notes.  I, Elnora Morrison, RMA am acting as transcriptionist for Coralie Common, MD.  I have reviewed the above documentation for accuracy and completeness, and I agree with the above. - Coralie Common, MD

## 2021-12-27 ENCOUNTER — Other Ambulatory Visit (INDEPENDENT_AMBULATORY_CARE_PROVIDER_SITE_OTHER): Payer: Self-pay | Admitting: Nurse Practitioner

## 2021-12-27 DIAGNOSIS — R7303 Prediabetes: Secondary | ICD-10-CM

## 2021-12-29 ENCOUNTER — Telehealth (INDEPENDENT_AMBULATORY_CARE_PROVIDER_SITE_OTHER): Payer: Self-pay | Admitting: Family Medicine

## 2021-12-29 NOTE — Telephone Encounter (Signed)
Patient called and stated she is out of her metformin. She stated she circled that she needed a refill of it on the med list. She stated her pharmacy sent over a prior authorization.

## 2021-12-30 ENCOUNTER — Other Ambulatory Visit (INDEPENDENT_AMBULATORY_CARE_PROVIDER_SITE_OTHER): Payer: Self-pay | Admitting: Nurse Practitioner

## 2021-12-30 DIAGNOSIS — R7303 Prediabetes: Secondary | ICD-10-CM

## 2022-01-02 MED ORDER — METFORMIN HCL 500 MG PO TABS: ORAL_TABLET | ORAL | 0 refills | Status: DC

## 2022-01-02 NOTE — Telephone Encounter (Signed)
Prescription was sent to the pharmacy

## 2022-01-23 ENCOUNTER — Ambulatory Visit (INDEPENDENT_AMBULATORY_CARE_PROVIDER_SITE_OTHER): Payer: BC Managed Care – PPO | Admitting: Family Medicine

## 2022-01-23 ENCOUNTER — Encounter (INDEPENDENT_AMBULATORY_CARE_PROVIDER_SITE_OTHER): Payer: Self-pay | Admitting: Family Medicine

## 2022-01-23 VITALS — BP 103/72 | HR 69 | Temp 98.6°F | Ht 68.0 in | Wt 195.0 lb

## 2022-01-23 DIAGNOSIS — E559 Vitamin D deficiency, unspecified: Secondary | ICD-10-CM | POA: Diagnosis not present

## 2022-01-23 DIAGNOSIS — R7303 Prediabetes: Secondary | ICD-10-CM | POA: Diagnosis not present

## 2022-01-23 DIAGNOSIS — E669 Obesity, unspecified: Secondary | ICD-10-CM

## 2022-01-23 DIAGNOSIS — Z6829 Body mass index (BMI) 29.0-29.9, adult: Secondary | ICD-10-CM | POA: Diagnosis not present

## 2022-02-08 ENCOUNTER — Ambulatory Visit (INDEPENDENT_AMBULATORY_CARE_PROVIDER_SITE_OTHER): Payer: BC Managed Care – PPO | Admitting: Radiology

## 2022-02-08 ENCOUNTER — Encounter: Payer: Self-pay | Admitting: Radiology

## 2022-02-08 VITALS — BP 114/76 | Ht 68.0 in | Wt 198.0 lb

## 2022-02-08 DIAGNOSIS — Z01419 Encounter for gynecological examination (general) (routine) without abnormal findings: Secondary | ICD-10-CM

## 2022-02-08 DIAGNOSIS — N958 Other specified menopausal and perimenopausal disorders: Secondary | ICD-10-CM | POA: Diagnosis not present

## 2022-02-08 MED ORDER — IMVEXXY MAINTENANCE PACK 10 MCG VA INST: 1.0000 | VAGINAL_INSERT | VAGINAL | 11 refills | Status: DC

## 2022-02-08 NOTE — Progress Notes (Signed)
   Crystal Simmons 29-Aug-1960 [id number]   History: Postmenopausal 61 y.o. presents for annual exam. Was using estrace cream but it has been too messy. C/o dyspareunia.    Gynecologic History Postmenopausal Last Pap: 01/21/19. Results were: normal Last mammogram: 05/09/21. Results were: normal Last colonoscopy: up to date HRT use: no  Obstetric History OB History  Gravida Para Term Preterm AB Living  0 0 0 0 0 0  SAB IAB Ectopic Multiple Live Births  0 0 0 0 0     The following portions of the patient's history were reviewed and updated as appropriate: allergies, current medications, past family history, past medical history, past social history, past surgical history, and problem list.  Review of Systems Pertinent items noted in HPI and remainder of comprehensive ROS otherwise negative.  Past medical history, past surgical history, family history and social history were all reviewed and documented in the EPIC chart.  Exam:  Vitals:   02/08/22 0834  BP: 114/76  Weight: 198 lb (89.8 kg)  Height: '5\' 8"'$  (1.727 m)   Body mass index is 30.11 kg/m.  General appearance:  Normal Thyroid:  Symmetrical, normal in size, without palpable masses or nodularity. Respiratory  Auscultation:  Clear without wheezing or rhonchi Cardiovascular  Auscultation:  Regular rate, without rubs, murmurs or gallops  Edema/varicosities:  Not grossly evident Abdominal  Soft,nontender, without masses, guarding or rebound.  Liver/spleen:  No organomegaly noted  Hernia:  None appreciated  Skin  Inspection:  Grossly normal Breasts: Examined lying and sitting.   Right: Without masses, retractions, nipple discharge or axillary adenopathy.   Left: Without masses, retractions, nipple discharge or axillary adenopathy. Genitourinary   Inguinal/mons:  Normal without inguinal adenopathy  External genitalia:  Normal appearing vulva with no masses, tenderness, or lesions  BUS/Urethra/Skene's glands:   Normal  Vagina:  Normal appearing with normal color and discharge, no lesions. Atrophy: moderate   Cervix:  Normal appearing without discharge or lesions  Uterus:  Normal in size, shape and contour.  Midline and mobile, nontender  Adnexa/parametria:     Rt: Normal in size, without masses or tenderness.   Lt: Normal in size, without masses or tenderness.  Anus and perineum: Normal    Patient informed chaperone available to be present for breast and pelvic exam. Patient has requested no chaperone to be present. Patient has been advised what will be completed during breast and pelvic exam.   Assessment/Plan:   1. Well woman exam with routine gynecological exam Pap due 2025  2. Genitourinary syndrome of menopause Finds estrace too messy  - Estradiol (IMVEXXY MAINTENANCE PACK) 10 MCG INST; Place 1 tablet vaginally 2 (two) times a week.  Dispense: 8 each; Refill: 11    Discussed SBE, colonoscopy and DEXA screening as directed. Recommend 113mns of exercise weekly, including weight bearing exercise. Encouraged the use of seatbelts and sunscreen.  Return in 1 year for annual or sooner prn.  CRubbie BattiestB WHNP-BC, 9:01 AM 02/08/2022

## 2022-02-10 NOTE — Progress Notes (Signed)
Chief Complaint:   OBESITY Crystal Simmons is here to discuss her progress with her obesity treatment plan along with follow-up of her obesity related diagnoses. Crystal Simmons is on keeping a food journal and adhering to recommended goals of 1200 calories and 85+ grams protein and states she is following her eating plan approximately 90% of the time. Crystal Simmons states she is walking 30 minutes 4 times per week.  Today's visit was #: 73 Starting weight: 211 lbs Starting date: 05/01/2019 Today's weight: 195 lbs Today's date: 01/23/2022 Total lbs lost to date: 16 Total lbs lost since last in-office visit: 3  Interim History: Pt is still getting approximately 100 grams protein daily. She is going to [place] next week with a friend. Pt is still journaling daily. She can almost pre-log with [employer] because she recognizes she is a habitual eater.  Subjective:   1. Vitamin D deficiency Crystal Simmons is on OTC Vitamin D 2,000 IU daily. Her Vitamin D level is 59.0.  2. Prediabetes Crystal Simmons's last A1c was 5.7 with an insulin level of 13.2. She is on Metformin (couldn't fill Saxenda due to [employer]).  Assessment/Plan:   1. Vitamin D deficiency Low Vitamin D level contributes to fatigue and are associated with obesity, breast, and colon cancer. She agrees to increase OTC Vitamin D to 5,000 IU daily and will follow-up for routine testing of Vitamin D, at least 2-3 times per year to avoid over-replacement.  2. Prediabetes Crystal Simmons will continue to work on weight loss, exercise, and decreasing simple carbohydrates to help decrease the risk of diabetes. Continue Metformin. No refills needed.  3. Obesity, current BMI of 29.7 Ji is currently in the action stage of change. As such, her goal is to continue with weight loss efforts. She has agreed to keeping a food journal and adhering to recommended goals of 1200-1300 calories and 90+ grams protein.   Exercise goals: All adults should avoid inactivity.  Some physical activity is better than none, and adults who participate in any amount of physical activity gain some health benefits.  Behavioral modification strategies: increasing lean protein intake, meal planning and cooking strategies, keeping healthy foods in the home, planning for success, and keeping a strict food journal.  Crystal Simmons has agreed to follow-up with our clinic in 4 weeks. She was informed of the importance of frequent follow-up visits to maximize her success with intensive lifestyle modifications for her multiple health conditions.   Objective:   Blood pressure 103/72, pulse 69, temperature 98.6 F (37 C), height '5\' 8"'$  (1.727 m), weight 195 lb (88.5 kg), last menstrual period 10/14/2005, SpO2 96 %. Body mass index is 29.65 kg/m.  General: Cooperative, alert, well developed, in no acute distress. HEENT: Conjunctivae and lids unremarkable. Cardiovascular: Regular rhythm.  Lungs: Normal work of breathing. Neurologic: No focal deficits.   Lab Results  Component Value Date   CREATININE 0.97 12/19/2021   BUN 13 12/19/2021   NA 140 12/19/2021   K 4.3 12/19/2021   CL 104 12/19/2021   CO2 22 12/19/2021   Lab Results  Component Value Date   ALT 15 12/19/2021   AST 16 12/19/2021   ALKPHOS 96 12/19/2021   BILITOT 0.5 12/19/2021   Lab Results  Component Value Date   HGBA1C 5.7 (H) 12/19/2021   HGBA1C 5.9 (H) 05/16/2021   HGBA1C 5.7 (H) 01/27/2021   HGBA1C 6.0 (H) 08/18/2020   HGBA1C 5.6 03/31/2020   Lab Results  Component Value Date   INSULIN 13.2 12/19/2021  INSULIN 10.9 05/16/2021   INSULIN 13.1 08/18/2020   INSULIN 6.3 03/31/2020   INSULIN 5.1 12/11/2019   Lab Results  Component Value Date   TSH 2.860 03/31/2020   Lab Results  Component Value Date   CHOL 167 03/31/2020   HDL 66 03/31/2020   LDLCALC 87 03/31/2020   TRIG 72 03/31/2020   CHOLHDL 2.5 03/31/2020   Lab Results  Component Value Date   VD25OH 59.0 12/19/2021   VD25OH 46.3 05/16/2021    VD25OH 49.1 08/18/2020   Lab Results  Component Value Date   WBC 6.6 03/31/2020   HGB 12.8 03/31/2020   HCT 38.6 03/31/2020   MCV 95 03/31/2020   PLT 300 03/31/2020   Lab Results  Component Value Date   IRON 93 03/31/2020   TIBC 264 03/31/2020   FERRITIN 65 03/31/2020   Attestation Statements:   Reviewed by clinician on day of visit: allergies, medications, problem list, medical history, surgical history, family history, social history, and previous encounter notes.  I, Kathlene November, BS, CMA, am acting as transcriptionist for Coralie Common, MD.   I have reviewed the above documentation for accuracy and completeness, and I agree with the above. - Coralie Common, MD

## 2022-02-20 ENCOUNTER — Ambulatory Visit: Payer: BC Managed Care – PPO | Admitting: Nurse Practitioner

## 2022-02-21 ENCOUNTER — Ambulatory Visit: Payer: BC Managed Care – PPO | Attending: Cardiology | Admitting: Cardiology

## 2022-02-21 ENCOUNTER — Encounter: Payer: Self-pay | Admitting: Cardiology

## 2022-02-21 VITALS — BP 126/86 | HR 75 | Ht 68.0 in | Wt 198.2 lb

## 2022-02-21 DIAGNOSIS — R7303 Prediabetes: Secondary | ICD-10-CM

## 2022-02-21 DIAGNOSIS — I4729 Other ventricular tachycardia: Secondary | ICD-10-CM | POA: Diagnosis not present

## 2022-02-21 DIAGNOSIS — I471 Supraventricular tachycardia, unspecified: Secondary | ICD-10-CM | POA: Diagnosis not present

## 2022-02-21 MED ORDER — METOPROLOL SUCCINATE ER 25 MG PO TB24: 12.5000 mg | ORAL_TABLET | Freq: Every day | ORAL | 3 refills | Status: DC

## 2022-02-21 NOTE — Patient Instructions (Signed)
Medication Instructions:  Your physician recommends that you continue on your current medications as directed. Please refer to the Current Medication list given to you today.  *If you need a refill on your cardiac medications before your next appointment, please call your pharmacy*   Lab Work: NONE If you have labs (blood work) drawn today and your tests are completely normal, you will receive your results only by: MyChart Message (if you have MyChart) OR A paper copy in the mail If you have any lab test that is abnormal or we need to change your treatment, we will call you to review the results.   Testing/Procedures: NONE   Follow-Up: At [facility], you and your health needs are our priority.  As part of our continuing mission to provide you with exceptional heart care, we have created designated Provider Care Teams.  These Care Teams include your primary Cardiologist (physician) and Advanced Practice Providers (APPs -  Physician Assistants and Nurse Practitioners) who all work together to provide you with the care you need, when you need it.  We recommend signing up for the patient portal called "MyChart".  Sign up information is provided on this After Visit Summary.  MyChart is used to connect with patients for Virtual Visits (Telemedicine).  Patients are able to view lab/test results, encounter notes, upcoming appointments, etc.  Non-urgent messages can be sent to your provider as well.   To learn more about what you can do with MyChart, go to [email or web address].    Your next appointment:   1 year(s)  The format for your next appointment:   In Person  Provider:   Kardie Tobb, DO   

## 2022-02-21 NOTE — Progress Notes (Signed)
Cardiology Office Note:    Date:  02/21/2022   ID:  Crystal Simmons, DOB November 29, 1960, MRN [id number]  PCP:  Kathyrn Lass, MD  Cardiologist:  Berniece Salines, DO  Electrophysiologist:  None   Referring MD: Kathyrn Lass, MD   " I am doing well"  History of Present Illness:    Crystal Simmons is a 62 y.o. female with a hx of vitamin D deficiency, prediabetes, NSVT, PSVT, GERD here today for follow-up visit.  Last visit in December 2022 this was a virtual visit we discussed her monitor results.  And I started the patient on Toprol-XL 1.5 mg daily.  During that time she was also experiencing shortness of breath on exertion.  For coronary CTA which came back with no evidence of coronary artery disease.  She is here today for follow-up visit she offers no complaints at this time.  She is doing well.   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   Insomnia    Joint pain    Lactose intolerance    Menopausal state 2007   Seasonal allergies    Shingles 04/2018   STD (sexually transmitted disease) 8/09   chlamydia   Vitamin D deficiency disease 2010    Past Surgical History:  Procedure Laterality Date   BREAST SURGERY Left 6/03   Biopsy negative   CERVIX LESION DESTRUCTION  4/92   COLONOSCOPY W/ BIOPSIES  09/26/10   polyp benign   COLPOSCOPY  10/12   CIN I   KNEE SURGERY  12/2009   left for meniscal tear   LAPAROSCOPY  7/02   multiple surgeries, including LSO, LOA, and endometrioma, and fibroids secondary to endometriosis   UMBILICAL EXPLORATION  1/49   for endometrioma     Current Medications: Current Meds  Medication Sig   Ascorbic Acid (VITAMIN C PO) Take by mouth.   cetirizine (ZYRTEC) 10 MG tablet Take 10 mg by mouth daily.   Cholecalciferol (VITAMIN D3) 50 MCG (2000 UT)  TABS Take by mouth.   ELDERBERRY PO Take by mouth. + zinc   Estradiol (IMVEXXY MAINTENANCE PACK) 10 MCG INST Place 1 tablet vaginally 2 (two) times a week.   fish oil-omega-3 fatty acids 1000 MG capsule Take 2 g by mouth daily.   fluticasone (FLONASE) 50 MCG/ACT nasal spray Place 2 sprays into the nose daily.   metFORMIN (GLUCOPHAGE) 500 MG tablet TAKE 1 TABLET BY MOUTH EVERY DAY WITH LUNCH   Multiple Vitamins-Minerals (MULTIVITAMIN WITH MINERALS) tablet Take 1 tablet by mouth daily.   PREBIOTIC PRODUCT PO Take by mouth.   [DISCONTINUED] metoprolol succinate (TOPROL XL) 25 MG 24 hr tablet Take 0.5 tablets (12.5 mg total) by mouth daily.     Allergies:   Sulfa antibiotics and Codeine   Social History   Socioeconomic History   Marital status: Single    Spouse name: Not on file   Number of children: 0   Years of education: Not on file   Highest education level: Not on file  Occupational History   Occupation: COLLECTION [employer]    Employer: [employer]  Tobacco Use   Smoking status: Never    Passive exposure: Never   Smokeless tobacco: Never  Vaping Use  Vaping Use: Never used  Substance and Sexual Activity   Alcohol use: Yes    Comment: 2-3 times a month   Drug use: No   Sexual activity: Yes    Partners: Male    Birth control/protection: Post-menopausal, None    Comment: menarche 62yo, sexual debut 62yo  Other Topics Concern   Not on file  Social History Narrative   Not on file   Social Determinants of Health   Financial Resource Strain: Not on file  Food Insecurity: Not on file  Transportation Needs: Not on file  Physical Activity: Not on file  Stress: Not on file  Social Connections: Not on file     Family History: The patient's family history includes Diabetes in her brother, father, and paternal grandfather; Heart disease in her father; Hypertension in her father and mother; Sudden death in her father.  ROS:   Review of Systems  Constitution:  Negative for decreased appetite, fever and weight gain.  HENT: Negative for congestion, ear discharge, hoarse voice and sore throat.   Eyes: Negative for discharge, redness, vision loss in right eye and visual halos.  Cardiovascular: Negative for chest pain, dyspnea on exertion, leg swelling, orthopnea and palpitations.  Respiratory: Negative for cough, hemoptysis, shortness of breath and snoring.   Endocrine: Negative for heat intolerance and polyphagia.  Hematologic/Lymphatic: Negative for bleeding problem. Does not bruise/bleed easily.  Skin: Negative for flushing, nail changes, rash and suspicious lesions.  Musculoskeletal: Negative for arthritis, joint pain, muscle cramps, myalgias, neck pain and stiffness.  Gastrointestinal: Negative for abdominal pain, bowel incontinence, diarrhea and excessive appetite.  Genitourinary: Negative for decreased libido, genital sores and incomplete emptying.  Neurological: Negative for brief paralysis, focal weakness, headaches and loss of balance.  Psychiatric/Behavioral: Negative for altered mental status, depression and suicidal ideas.  Allergic/Immunologic: Negative for HIV exposure and persistent infections.    EKGs/Labs/Other Studies Reviewed:    The following studies were reviewed today:   EKG:  The ekg ordered today demonstrates sinus rhythm, heart rate 75 beats minute.  TTE 01/25/2021 FINDINGS   Left Ventricle: Left ventricular ejection fraction, by estimation, is 50  to 55%. Left ventricular ejection fraction by 3D volume is 51 %. The left  ventricle has low normal function. The left ventricle has no regional wall  motion abnormalities. The  average left ventricular global longitudinal strain is -20.8 %. The global  longitudinal strain is normal. The left ventricular internal cavity size  was normal in size. There is no left ventricular hypertrophy. Left  ventricular diastolic parameters are  consistent with Grade I diastolic  dysfunction (impaired relaxation).  Indeterminate filling pressures.   Right Ventricle: The right ventricular size is normal. No increase in  right ventricular wall thickness. Right ventricular systolic function is  normal. Tricuspid regurgitation signal is inadequate for assessing PA  pressure.   Left Atrium: Left atrial size was normal in size.   Right Atrium: Right atrial size was normal in size.   Pericardium: There is no evidence of pericardial effusion.   Mitral Valve: The mitral valve is abnormal. There is mild thickening of  the mitral valve leaflet(s). Trivial mitral valve regurgitation.   Tricuspid Valve: The tricuspid valve is grossly normal. Tricuspid valve  regurgitation is trivial.   Aortic Valve: The aortic valve is tricuspid. Aortic valve regurgitation is  not visualized.   Pulmonic Valve: The pulmonic valve was grossly normal. Pulmonic valve  regurgitation is trivial.   Aorta: The aortic root and ascending aorta are  structurally normal, with  no evidence of dilitation.   Venous: The inferior vena cava is normal in size with less than 50%  respiratory variability, suggesting right atrial pressure of 8 mmHg.   IAS/Shunts: No atrial level shunt detected by color flow Doppler.       CCTA 02/16/2021 Aorta: Normal size.  No calcifications.  No dissection.   Aortic Valve:  Trileaflet.  No calcifications.   Coronary Arteries:  Normal coronary origin.  Right dominance.   RCA is a large dominant artery that gives rise to PDA and PLA. There is no plaque.   Left main is a large artery that gives rise to LAD and LCX arteries.   LAD is a large vessel that has no plaque.   LCX is a non-dominant artery that gives rise to one large OM1 branch. There is no plaque.   Coronary Calcium Score:   Left main: 0   Left anterior descending artery: 0   Left circumflex artery: 0   Right coronary artery: 0   Total: 0   Percentile: 0   Other findings:   Normal  pulmonary vein drainage into the left atrium.   Normal left atrial appendage without a thrombus.   Normal size of the pulmonary artery.   Small left ventricular diverticulum.   IMPRESSION: 1. Coronary calcium score of 0. This was 0 percentile for age and sex matched control.   2. Normal coronary origin with right dominance.   3. No evidence of CAD. CAD-RADS 0. No evidence of CAD (0%). Consider non-atherosclerotic causes of chest pain.   4.  Small Left ventricular diverticulum.     Electronically Signed   By: Berniece Salines D.O.   On: 02/16/2021 16:59    Addended by Berniece Salines, DO on 02/16/2021  5:02 PM    Study Result  Narrative & Impression  EXAM: OVER-READ INTERPRETATION  CT CHEST   The following report is an over-read performed by radiologist Dr. Abigail Miyamoto of [facility], [place] on 02/16/2021. This over-read does not include interpretation of cardiac or coronary anatomy or pathology. The coronary CTA interpretation by the cardiologist is attached.   COMPARISON:  Chest and rib radiographs of 12/04/2016.   FINDINGS: Vascular: Normal aortic caliber. No central pulmonary embolism, on this non-dedicated study.   Mediastinum/Nodes: No imaged thoracic adenopathy.   Lungs/Pleura: No pleural fluid.  Clear imaged lungs.   Upper Abdomen: Normal imaged portions of the liver, spleen, stomach.   Musculoskeletal: No acute osseous abnormality.   IMPRESSION: No acute findings in the imaged extracardiac chest.   Electronically Signed: By: Abigail Miyamoto M.D. On: 02/16/2021 16:46    Zio monitor 01/26/2021 Patch Wear Time:  12 days and 7 hours (2022-11-25T20:29:08-498 to 2022-12-08T03:50:19-0500)   Patient had a min HR of 54 bpm, max HR of 222 bpm, and avg HR of 82 bpm. Predominant underlying rhythm was Sinus Rhythm. 7 Ventricular Tachycardia runs occurred, the run with the fastest interval lasting 7 beats with a max rate of 218 bpm, the longest  lasting 11 beats with  an avg rate of 120 bpm. 7 Supraventricular Tachycardia runs occurred, the run with the fastest interval lasting 13.3 secs with a max rate of 222 bpm (avg 169 bpm); the run with the fastest interval was also the longest. Isolated  SVEs were rare (<1.0%), SVE Couplets were rare (<1.0%), and SVE Triplets were rare (<1.0%). Isolated VEs were rare (<1.0%, 5033), VE Couplets were rare (<1.0%, 16), and VE Triplets were rare (<1.0%,  1). Ventricular Bigeminy was present. Inverted QRS  complexes possibly due to inverted placement of device.     Conclusion: This study is markable for the following:                                  1.  Short run of nonsustained ventricular tachycardia                                  2.  Paroxysmal supraventricular tachycardia.   This was already discussed with the patient she has been started on low-dose beta-blocker. Recent Labs: 12/19/2021: ALT 15; BUN 13; Creatinine, Ser 0.97; Potassium 4.3; Sodium 140  Recent Lipid Panel    Component Value Date/Time   CHOL 167 03/31/2020 0758   TRIG 72 03/31/2020 0758   HDL 66 03/31/2020 0758   CHOLHDL 2.5 03/31/2020 0758   CHOLHDL 2.7 12/13/2015 1104   VLDL 11 12/13/2015 1104   LDLCALC 87 03/31/2020 0758    Physical Exam:    VS:  BP 126/86   Pulse 75   Ht '5\' 8"'$  (1.727 m)   Wt 89.9 kg   LMP 10/14/2005 (Approximate)   SpO2 92%   BMI 30.14 kg/m     Wt Readings from Last 3 Encounters:  02/21/22 89.9 kg  02/08/22 89.8 kg  01/23/22 88.5 kg     GEN: Well nourished, well developed in no acute distress HEENT: Normal NECK: No JVD; No carotid bruits LYMPHATICS: No lymphadenopathy CARDIAC: S1S2 noted,RRR, no murmurs, rubs, gallops RESPIRATORY:  Clear to auscultation without rales, wheezing or rhonchi  ABDOMEN: Soft, non-tender, non-distended, +bowel sounds, no guarding. EXTREMITIES: No edema, No cyanosis, no clubbing MUSCULOSKELETAL:  No deformity  SKIN: Warm and dry NEUROLOGIC:  Alert and oriented x 3,  non-focal PSYCHIATRIC:  Normal affect, good insight  ASSESSMENT:    1. NSVT (nonsustained ventricular tachycardia) ([place])   2. PSVT (paroxysmal supraventricular tachycardia)   3. Prediabetes    PLAN:     Clinically she appears to be doing well from a cardiovascular standpoint.  Will continue with the patient on her current dose of metoprolol 12.5 mg daily.  Refills will be sent.  The patient is in agreement with the above plan. The patient left the office in stable condition.  The patient will follow up in a year or sooner if needed.   Medication Adjustments/Labs and Tests Ordered: Current medicines are reviewed at length with the patient today.  Concerns regarding medicines are outlined above.  Orders Placed This Encounter  Procedures   EKG 12-Lead   Meds ordered this encounter  Medications   metoprolol succinate (TOPROL XL) 25 MG 24 hr tablet    Sig: Take 0.5 tablets (12.5 mg total) by mouth daily.    Dispense:  45 tablet    Refill:  3    Patient Instructions  Medication Instructions:  Your physician recommends that you continue on your current medications as directed. Please refer to the Current Medication list given to you today.  *If you need a refill on your cardiac medications before your next appointment, please call your pharmacy*   Lab Work: NONE If you have labs (blood work) drawn today and your tests are completely normal, you will receive your results only by: [place] (if you have MyChart) OR A paper copy in the mail If you have any lab  test that is abnormal or we need to change your treatment, we will call you to review the results.   Testing/Procedures: NONE   Follow-Up: At [facility], you and your health needs are our priority.  As part of our continuing mission to provide you with exceptional heart care, we have created designated Provider Care Teams.  These Care Teams include your primary Cardiologist (physician) and Advanced  Practice Providers (APPs -  Physician Assistants and Nurse Practitioners) who all work together to provide you with the care you need, when you need it.  We recommend signing up for the patient portal called "MyChart".  Sign up information is provided on this After Visit Summary.  MyChart is used to connect with patients for Virtual Visits (Telemedicine).  Patients are able to view lab/test results, encounter notes, upcoming appointments, etc.  Non-urgent messages can be sent to your provider as well.   To learn more about what you can do with MyChart, go to [email or web address].    Your next appointment:   1 year(s)  The format for your next appointment:   In Person  Provider:   Berniece Salines, DO     Adopting a Healthy Lifestyle.  Know what a healthy weight is for you (roughly BMI <25) and aim to maintain this   Aim for 7+ servings of fruits and vegetables daily   65-80+ fluid ounces of water or unsweet tea for healthy kidneys   Limit to max 1 drink of alcohol per day; avoid smoking/tobacco   Limit animal fats in diet for cholesterol and heart health - choose grass fed whenever available   Avoid highly processed foods, and foods high in saturated/trans fats   Aim for low stress - take time to unwind and care for your mental health   Aim for 150 min of moderate intensity exercise weekly for heart health, and weights twice weekly for bone health   Aim for 7-9 hours of sleep daily   When it comes to diets, agreement about the perfect plan isnt easy to find, even among the experts. Experts at the [employer] developed an idea known as the Healthy Eating Plate. Just imagine a plate divided into logical, healthy portions.   The emphasis is on diet quality:   Load up on vegetables and fruits - one-half of your plate: Aim for color and variety, and remember that potatoes dont count.   Go for whole grains - one-quarter of your plate: Whole wheat, barley, wheat  berries, quinoa, oats, brown rice, and foods made with them. If you want pasta, go with whole wheat pasta.   Protein power - one-quarter of your plate: Fish, chicken, beans, and nuts are all healthy, versatile protein sources. Limit red meat.   The diet, however, does go beyond the plate, offering a few other suggestions.   Use healthy plant oils, such as olive, canola, soy, corn, sunflower and peanut. Check the labels, and avoid partially hydrogenated oil, which have unhealthy trans fats.   If youre thirsty, drink water. Coffee and tea are good in moderation, but skip sugary drinks and limit milk and dairy products to one or two daily servings.   The type of carbohydrate in the diet is more important than the amount. Some sources of carbohydrates, such as vegetables, fruits, whole grains, and beans-are healthier than others.   Finally, stay active  Signed, Berniece Salines, DO  02/21/2022 8:27 AM    [place]

## 2022-03-02 ENCOUNTER — Ambulatory Visit (INDEPENDENT_AMBULATORY_CARE_PROVIDER_SITE_OTHER): Payer: BC Managed Care – PPO | Admitting: Family Medicine

## 2022-03-14 ENCOUNTER — Encounter (INDEPENDENT_AMBULATORY_CARE_PROVIDER_SITE_OTHER): Payer: Self-pay | Admitting: Physician Assistant

## 2022-03-14 ENCOUNTER — Ambulatory Visit (INDEPENDENT_AMBULATORY_CARE_PROVIDER_SITE_OTHER): Payer: BC Managed Care – PPO | Admitting: Physician Assistant

## 2022-03-14 VITALS — BP 118/82 | HR 100 | Temp 98.3°F | Ht 68.0 in | Wt 195.0 lb

## 2022-03-14 DIAGNOSIS — R5383 Other fatigue: Secondary | ICD-10-CM

## 2022-03-14 DIAGNOSIS — E559 Vitamin D deficiency, unspecified: Secondary | ICD-10-CM | POA: Diagnosis not present

## 2022-03-14 DIAGNOSIS — R7303 Prediabetes: Secondary | ICD-10-CM | POA: Diagnosis not present

## 2022-03-14 DIAGNOSIS — E669 Obesity, unspecified: Secondary | ICD-10-CM

## 2022-03-14 DIAGNOSIS — Z6829 Body mass index (BMI) 29.0-29.9, adult: Secondary | ICD-10-CM

## 2022-03-14 MED ORDER — METFORMIN HCL 500 MG PO TABS: ORAL_TABLET | ORAL | 0 refills | Status: DC

## 2022-03-15 LAB — VITAMIN B12: Vitamin B-12: 928 pg/mL (ref 232–1245)

## 2022-03-15 LAB — LIPID PANEL WITH LDL/HDL RATIO
Cholesterol, Total: 174 mg/dL (ref 100–199)
HDL: 69 mg/dL (ref >39)
LDL Chol Calc (NIH): 92 mg/dL (ref 0–99)
LDL/HDL Ratio: 1.3 ratio (ref 0.0–3.2)
Triglycerides: 71 mg/dL (ref 0–149)
VLDL Cholesterol Cal: 13 mg/dL (ref 5–40)

## 2022-03-15 LAB — CBC WITH DIFFERENTIAL/PLATELET
Basophils Absolute: 0.1 10*3/uL (ref 0.0–0.2)
Basos: 1 %
EOS (ABSOLUTE): 0.1 10*3/uL (ref 0.0–0.4)
Eos: 1 %
Hematocrit: 38.3 % (ref 34.0–46.6)
Hemoglobin: 12.2 g/dL (ref 11.1–15.9)
Immature Grans (Abs): 0 10*3/uL (ref 0.0–0.1)
Immature Granulocytes: 0 %
Lymphocytes Absolute: 2.9 10*3/uL (ref 0.7–3.1)
Lymphs: 40 %
MCH: 30.7 pg (ref 26.6–33.0)
MCHC: 31.9 g/dL (ref 31.5–35.7)
MCV: 97 fL (ref 79–97)
Monocytes Absolute: 0.4 10*3/uL (ref 0.1–0.9)
Monocytes: 5 %
Neutrophils Absolute: 3.9 10*3/uL (ref 1.4–7.0)
Neutrophils: 53 %
Platelets: 314 10*3/uL (ref 150–450)
RBC: 3.97 x10E6/uL (ref 3.77–5.28)
RDW: 12.9 % (ref 11.7–15.4)
WBC: 7.3 10*3/uL (ref 3.4–10.8)

## 2022-03-15 LAB — INSULIN, RANDOM: INSULIN: 6 u[IU]/mL (ref 2.6–24.9)

## 2022-03-15 LAB — CMP14+EGFR
ALT: 14 IU/L (ref 0–32)
AST: 17 IU/L (ref 0–40)
Albumin/Globulin Ratio: 1.4 (ref 1.2–2.2)
Albumin: 4.2 g/dL (ref 3.9–4.9)
Alkaline Phosphatase: 108 IU/L (ref 44–121)
BUN/Creatinine Ratio: 18 (ref 12–28)
BUN: 17 mg/dL (ref 8–27)
Bilirubin Total: 0.5 mg/dL (ref 0.0–1.2)
CO2: 21 mmol/L (ref 20–29)
Calcium: 9.6 mg/dL (ref 8.7–10.3)
Chloride: 103 mmol/L (ref 96–106)
Creatinine, Ser: 0.93 mg/dL (ref 0.57–1.00)
Globulin, Total: 3 g/dL (ref 1.5–4.5)
Glucose: 86 mg/dL (ref 70–99)
Potassium: 4.1 mmol/L (ref 3.5–5.2)
Sodium: 141 mmol/L (ref 134–144)
Total Protein: 7.2 g/dL (ref 6.0–8.5)
eGFR: 70 mL/min/{1.73_m2} (ref >59)

## 2022-03-15 LAB — TSH: TSH: 2.72 u[IU]/mL (ref 0.450–4.500)

## 2022-03-15 LAB — HEMOGLOBIN A1C
Est. average glucose Bld gHb Est-mCnc: 123 mg/dL
Hgb A1c MFr Bld: 5.9 % — ABNORMAL HIGH (ref 4.8–5.6)

## 2022-03-15 LAB — VITAMIN D 25 HYDROXY (VIT D DEFICIENCY, FRACTURES): Vit D, 25-Hydroxy: 52.3 ng/mL (ref 30.0–100.0)

## 2022-03-21 NOTE — Progress Notes (Signed)
Chief Complaint:   OBESITY Crystal Simmons is here to discuss her progress with her obesity treatment plan along with follow-up of her obesity related diagnoses. Crystal Simmons is on keeping a food journal and adhering to recommended goals of 1200-1300 calories and 90+ grams of protein and states she is following her eating plan approximately 80% of the time. [name] states she is doing cardio/walking /weights 30-45 minutes 4 times per week.  Today's visit was #: 60 Starting weight: 211 lbs Starting date: 05/01/2019 Today's weight: 195 lbs Today's date: 03/14/2022 Total lbs lost to date: 16 lbs Total lbs lost since last in-office visit: 0  Interim History: Crystal Simmons has done very well with weight loss overall.  She reports increased work stress lately. She is trying to focus on meeting her protein goals and reports getting 90 to 100 g daily.  She has been doing intermittent fasting and eats between 12 noon and 8 PM.  She has been snacking some on pork rinds and reports this helps with some salt cravings.  She has not been on a GLP-1 drug recently due to national drug shortage and she is concerned about insurance coverage for the longer term.  She would like to continue to hold off resuming a GLP-1 medication, and she is going to switch to the low carbohydrate nutrition plan for the next few weeks to kick start her weight loss in the new year. She recently traveled to [place] for a week, wonderful trip!  Subjective:   1. Pre-diabetes Crystal Simmons is on Metformin 500 mg daily-without side effects.  On 12/19/2021 A1c was 5.7, insulin was 13.2-not at goal.  She would like to remain off of GLP-1 medications for now and will monitor her progress over the next several weeks.  She will continue to work on decreasing simple carbs, increasing lean protein and exercise to promote weight loss, decrease insulin resistance, improve glycemic control and prevent the development of type 2 diabetes.  2. Vitamin D deficiency Crystal Simmons is  on over-the-counter vitamin D 2000 units daily.  No side effects.  On 12/19/2021 level of 59-at goal.  3. Fatigue, unspecified type Crystal Simmons endorses fatigue.  She feels it is related to work stress.  No recent B12 or TSH.  Will check her B12 and TSH and other labs today.  She is not on any medications/supplements other than over-the-counter vitamin D3 2000 UT daily and a multivitamin daily.  Assessment/Plan:   1. Pre-diabetes We will obtain labs today.  Continue/Refill Metformin 500 mg by mouth daily for 3 months with 0 refills.  Continue Prescribed Nutrition Plan and exercise to promote weight loss and improve glycemic control, prevent progression to type 2 diabetes.   -Refill metFORMIN (GLUCOPHAGE) 500 MG tablet; TAKE 1 TABLET BY MOUTH EVERY DAY WITH LUNCH  Dispense: 90 tablet; Refill: 0  - CMP14+EGFR - Hemoglobin A1c - Insulin, random - Lipid Panel With LDL/HDL Ratio  2. Vitamin D deficiency We will obtain labs today.  Continue Prescribed Nutrition Plan and exercise to promote weight loss.   3. Fatigue, unspecified type We will obtain labs today.  Continue Prescribed Nutrition Plan and exercise to promote weight loss.   - Vitamin B12 - CBC with Differential/Platelet - TSH - VITAMIN D 25 Hydroxy (Vit-D Deficiency, Fractures)  4. Obesity, current BMI of 29.6 Crystal Simmons is currently in the action stage of change. As such, her goal is to continue with weight loss efforts. She has agreed to following a lower carbohydrate, vegetable and lean protein rich  diet plan.   Exercise goals: As is.  Behavioral modification strategies: increasing lean protein intake, decreasing simple carbohydrates, planning for success, and keeping a strict food journal.  Crystal Simmons has agreed to follow-up with our clinic in 4 weeks. She was informed of the importance of frequent follow-up visits to maximize her success with intensive lifestyle modifications for her multiple health conditions.   Crystal Simmons was informed we  would discuss her lab results at her next visit unless there is a critical issue that needs to be addressed sooner. Crystal Simmons agreed to keep her next visit at the agreed upon time to discuss these results.  Objective:   Blood pressure 118/82, pulse 100, temperature 98.3 F (36.8 C), height 5' 8"$  (1.727 m), weight 195 lb (88.5 kg), last menstrual period 10/14/2005, SpO2 99 %. Body mass index is 29.65 kg/m.  General: Cooperative, alert, well developed, in no acute distress. HEENT: Conjunctivae and lids unremarkable. Cardiovascular: Regular rhythm.  Lungs: Normal work of breathing. Neurologic: No focal deficits.   Lab Results  Component Value Date   CREATININE 0.93 03/14/2022   BUN 17 03/14/2022   NA 141 03/14/2022   K 4.1 03/14/2022   CL 103 03/14/2022   CO2 21 03/14/2022   Lab Results  Component Value Date   ALT 14 03/14/2022   AST 17 03/14/2022   ALKPHOS 108 03/14/2022   BILITOT 0.5 03/14/2022   Lab Results  Component Value Date   HGBA1C 5.9 (H) 03/14/2022   HGBA1C 5.7 (H) 12/19/2021   HGBA1C 5.9 (H) 05/16/2021   HGBA1C 5.7 (H) 01/27/2021   HGBA1C 6.0 (H) 08/18/2020   Lab Results  Component Value Date   INSULIN 6.0 03/14/2022   INSULIN 13.2 12/19/2021   INSULIN 10.9 05/16/2021   INSULIN 13.1 08/18/2020   INSULIN 6.3 03/31/2020   Lab Results  Component Value Date   TSH 2.720 03/14/2022   Lab Results  Component Value Date   CHOL 174 03/14/2022   HDL 69 03/14/2022   LDLCALC 92 03/14/2022   TRIG 71 03/14/2022   CHOLHDL 2.5 03/31/2020   Lab Results  Component Value Date   VD25OH 52.3 03/14/2022   VD25OH 59.0 12/19/2021   VD25OH 46.3 05/16/2021   Lab Results  Component Value Date   WBC 7.3 03/14/2022   HGB 12.2 03/14/2022   HCT 38.3 03/14/2022   MCV 97 03/14/2022   PLT 314 03/14/2022   Lab Results  Component Value Date   IRON 93 03/31/2020   TIBC 264 03/31/2020   FERRITIN 65 03/31/2020   Attestation Statements:   Reviewed by clinician on day of  visit: allergies, medications, problem list, medical history, surgical history, family history, social history, and previous encounter notes.  I, Brendell Tyus, am acting as transcriptionist for [employer], [employer].  I have reviewed the above documentation for accuracy and completeness, and I agree with the above. -  Hannahgrace Lalli,PA-C

## 2022-04-12 ENCOUNTER — Ambulatory Visit (INDEPENDENT_AMBULATORY_CARE_PROVIDER_SITE_OTHER): Payer: BC Managed Care – PPO | Admitting: Physician Assistant

## 2022-04-24 ENCOUNTER — Other Ambulatory Visit: Payer: Self-pay | Admitting: Nurse Practitioner

## 2022-04-24 DIAGNOSIS — Z1231 Encounter for screening mammogram for malignant neoplasm of breast: Secondary | ICD-10-CM

## 2022-05-03 ENCOUNTER — Ambulatory Visit (INDEPENDENT_AMBULATORY_CARE_PROVIDER_SITE_OTHER): Payer: BC Managed Care – PPO | Admitting: Physician Assistant

## 2022-06-06 ENCOUNTER — Ambulatory Visit
Admission: RE | Admit: 2022-06-06 | Discharge: 2022-06-06 | Disposition: A | Discharge status: Home / Self Care | Payer: BC Managed Care – PPO | Source: Ambulatory Visit | Attending: Nurse Practitioner | Admitting: Nurse Practitioner

## 2022-06-06 DIAGNOSIS — Z1231 Encounter for screening mammogram for malignant neoplasm of breast: Secondary | ICD-10-CM

## 2022-06-22 ENCOUNTER — Other Ambulatory Visit (INDEPENDENT_AMBULATORY_CARE_PROVIDER_SITE_OTHER): Payer: Self-pay | Admitting: Physician Assistant

## 2022-06-22 DIAGNOSIS — R7303 Prediabetes: Secondary | ICD-10-CM

## 2022-07-03 ENCOUNTER — Encounter (INDEPENDENT_AMBULATORY_CARE_PROVIDER_SITE_OTHER): Payer: Self-pay | Admitting: Physician Assistant

## 2022-07-03 ENCOUNTER — Ambulatory Visit (INDEPENDENT_AMBULATORY_CARE_PROVIDER_SITE_OTHER): Payer: BC Managed Care – PPO | Admitting: Physician Assistant

## 2022-07-03 VITALS — BP 117/81 | HR 89 | Temp 98.7°F | Wt 204.0 lb

## 2022-07-03 DIAGNOSIS — E559 Vitamin D deficiency, unspecified: Secondary | ICD-10-CM | POA: Diagnosis not present

## 2022-07-03 DIAGNOSIS — R7303 Prediabetes: Secondary | ICD-10-CM

## 2022-07-03 DIAGNOSIS — Z6831 Body mass index (BMI) 31.0-31.9, adult: Secondary | ICD-10-CM | POA: Insufficient documentation

## 2022-07-03 DIAGNOSIS — E669 Obesity, unspecified: Secondary | ICD-10-CM

## 2022-07-03 DIAGNOSIS — R632 Polyphagia: Secondary | ICD-10-CM

## 2022-07-03 MED ORDER — METFORMIN HCL 500 MG PO TABS: ORAL_TABLET | ORAL | 0 refills | Status: DC

## 2022-07-03 MED ORDER — TIRZEPATIDE-WEIGHT MANAGEMENT 2.5 MG/0.5ML ~~LOC~~ SOAJ: 2.5000 mg | SUBCUTANEOUS | 0 refills | Status: DC

## 2022-07-03 NOTE — Progress Notes (Signed)
.smr  Office: [phone]  /  Fax: [phone]  WEIGHT SUMMARY AND BIOMETRICS  Vitals Temp: 98.7 F (37.1 C) BP: 117/81 Pulse Rate: 89 SpO2: 99 %   Anthropometric Measurements Weight: 204 lb (92.5 kg) Weight at Last Visit: 195 lb   Body Composition  Body Fat %: 38.4 % Fat Mass (lbs): 78.4 lbs Muscle Mass (lbs): 119.4 lbs Total Body Water (lbs): 85 lbs Visceral Fat Rating : 10   Other Clinical Data Fasting: yes Labs: no     HPI  Chief Complaint: OBESITY  Crystal Simmons is here to discuss her progress with her obesity treatment plan. She is on the keeping a food journal and adhering to recommended goals of 1300 calories and 80-100 protein and states she is following her eating plan approximately 70 % of the time. She states she is exercising/wa;king 30 minutes 3-4 times per week.   Interval History:  Since last office visit she is up 9 lbs. Last in office visit was 03/14/2022. Bio impedence scale: Up 3.4 lbs muscle mass, Up 6.4 lbs adipose mass, up 0.2 lbs water  She has been very busy, got a promotion in Feb. 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 Hoping to hire another coworker soon to help with increased load.  Hunger/appetite-moderate control- Does better during week, then falls off on weekends.  Cravings- increased for chips Stress- increased with promotion,  Sleep- not consistently restorative Exercise-Good during week, 3-4 times for 30 minutes, but not much on weekends.  Hydration-very good 84-100 oz daily.   Has cruise coming up in mid June.    Pharmacotherapy: Metformin for prediabetes.  She is interested in GLP/GIP medications and would like to try Zepbound. PReviously did well with Wegovy, but not consistently available and stopped. Patient denies a personal or family history of pancreatitis, medullary thyroid carcinoma or multiple endocrine neoplasia type II. Recommend reviewing pen training video online.   TREATMENT PLAN FOR  OBESITY:  Recommended Dietary Goals  Crystal Simmons is currently in the action stage of change. As such, her goal is to continue weight management plan. She has agreed to keeping a food journal and adhering to recommended goals of 1300 calories and 80-100 grams protein.  Behavioral Intervention  We discussed the following Behavioral Modification Strategies today: increasing lean protein intake, decreasing simple carbohydrates , increasing vegetables, avoiding skipping meals, work on managing stress, creating time for self-care and relaxation measures, continue to practice mindfulness when eating, and planning for success.  Additional resources provided today: NA  Recommended Physical Activity Goals  Galen has been advised to work up to 150 minutes of moderate intensity aerobic activity a week and strengthening exercises 2-3 times per week for cardiovascular health, weight loss maintenance and preservation of muscle mass.   She has agreed to Think about ways to increase daily physical activity and overcoming barriers to exercise   Pharmacotherapy We discussed various medication options to help Crystal Simmons with her weight loss efforts and we both agreed to start Zepbound 2.5 mg weekly and continue metformin for prediabetes.    Return in about 3 weeks (around 07/24/2022).Marland Kitchen She was informed of the importance of frequent follow up visits to maximize her success with intensive lifestyle modifications for her multiple health conditions.  PHYSICAL EXAM:  Blood pressure 117/81, pulse 89, temperature 98.7 F (37.1 C), weight 204 lb (92.5 kg), last menstrual period 10/14/2005, SpO2 99 %. Body mass index is 31.02 kg/m.  General: She is overweight, cooperative, alert, well developed, and in  no acute distress. PSYCH: Has normal mood, affect and thought process.   Cardiovascular : HR 80's regular Lungs: Normal breathing effort, no conversational dyspnea. Neuro: no focal deficits  DIAGNOSTIC DATA  REVIEWED:  BMET    Component Value Date/Time   NA 141 03/14/2022 0820   K 4.1 03/14/2022 0820   CL 103 03/14/2022 0820   CO2 21 03/14/2022 0820   GLUCOSE 86 03/14/2022 0820   GLUCOSE 88 12/13/2015 1104   BUN 17 03/14/2022 0820   CREATININE 0.93 03/14/2022 0820   CREATININE 0.88 12/13/2015 1104   CALCIUM 9.6 03/14/2022 0820   GFRNONAA 68 03/31/2020 0758   GFRAA 79 03/31/2020 0758   Lab Results  Component Value Date   HGBA1C 5.9 (H) 03/14/2022   HGBA1C 5.4 12/13/2015   Lab Results  Component Value Date   INSULIN 6.0 03/14/2022   INSULIN 8.9 05/01/2019   Lab Results  Component Value Date   TSH 2.720 03/14/2022   CBC    Component Value Date/Time   WBC 7.3 03/14/2022 0820   WBC 7.4 12/13/2015 1104   RBC 3.97 03/14/2022 0820   RBC 3.84 12/13/2015 1104   HGB 12.2 03/14/2022 0820   HGB 11.3 (L) 12/13/2015 1347   HCT 38.3 03/14/2022 0820   PLT 314 03/14/2022 0820   MCV 97 03/14/2022 0820   MCH 30.7 03/14/2022 0820   MCH 30.7 12/13/2015 1104   MCHC 31.9 03/14/2022 0820   MCHC 32.4 12/13/2015 1104   RDW 12.9 03/14/2022 0820   Iron Studies    Component Value Date/Time   IRON 93 03/31/2020 0758   TIBC 264 03/31/2020 0758   FERRITIN 65 03/31/2020 0758   IRONPCTSAT 35 03/31/2020 0758   Lipid Panel     Component Value Date/Time   CHOL 174 03/14/2022 0820   TRIG 71 03/14/2022 0820   HDL 69 03/14/2022 0820   CHOLHDL 2.5 03/31/2020 0758   CHOLHDL 2.7 12/13/2015 1104   VLDL 11 12/13/2015 1104   LDLCALC 92 03/14/2022 0820   Hepatic Function Panel     Component Value Date/Time   PROT 7.2 03/14/2022 0820   ALBUMIN 4.2 03/14/2022 0820   AST 17 03/14/2022 0820   ALT 14 03/14/2022 0820   ALKPHOS 108 03/14/2022 0820   BILITOT 0.5 03/14/2022 0820      Component Value Date/Time   TSH 2.720 03/14/2022 0820   Nutritional Lab Results  Component Value Date   VD25OH 52.3 03/14/2022   VD25OH 59.0 12/19/2021   VD25OH 46.3 05/16/2021    ASSOCIATED CONDITIONS  ADDRESSED TODAY  ASSESSMENT AND PLAN  Problem List Items Addressed This Visit     Prediabetes - Primary   Relevant Medications   metFORMIN (GLUCOPHAGE) 500 MG tablet   Generalized obesity   Relevant Medications   metFORMIN (GLUCOPHAGE) 500 MG tablet   tirzepatide (ZEPBOUND) 2.5 MG/0.5ML Pen   Vitamin D deficiency   Polyphagia   BMI 31.0-31.9,adult Current BMI 31.0   Prediabetes Labs were reviewed today and discussed with the patient.  Last A1c was 5.9- slightly worse/ Insulin 6.0- improved.   Medication(s): Metformin 500 mg once daily breakfast No GI side effects with metformin.  Discussed GLP-GIP medication, Zepbound with the patient today.  Patient denies a personal or family history of pancreatitis, medullary thyroid carcinoma or multiple endocrine neoplasia type II. Recommend reviewing pen training video online. She is interested in starting Zepbound  at this time.  Lab Results  Component Value Date   HGBA1C 5.9 (H) 03/14/2022  HGBA1C 5.7 (H) 12/19/2021   HGBA1C 5.9 (H) 05/16/2021   HGBA1C 5.7 (H) 01/27/2021   HGBA1C 6.0 (H) 08/18/2020   Lab Results  Component Value Date   INSULIN 6.0 03/14/2022   INSULIN 13.2 12/19/2021   INSULIN 10.9 05/16/2021   INSULIN 13.1 08/18/2020   INSULIN 6.3 03/31/2020    Plan: Continue and refill Metformin 500 mg once daily breakfast and start Zepbound 2.5 mg weekly.  Continue working on nutrition plan to decrease simple carbohydrates, increase lean proteins and exercise to promote weight loss, improve glycemic control and prevent progression to Type 2 diabetes.  Will plan to submit Zepbound for PA and if approved and starts medication, would plan to recheck labs in 2-3 months.   Vitamin D Deficiency Labs were reviewed today and discussed with the patient.  Vitamin D is at goal of 50.  Most recent vitamin D level was 52.3. She is on OTC vitamin D3 2000 IU daily. Lab Results  Component Value Date   VD25OH 52.3 03/14/2022   VD25OH  59.0 12/19/2021   VD25OH 46.3 05/16/2021    Plan: Continue OTC vitamin D3 2000 IU daily Low vitamin D levels can be associated with adiposity and may result in leptin resistance and weight gain. Also associated with fatigue. Currently on vitamin D supplementation without any adverse effects.  Recheck vitamin D level in next 2-3 months.   Polyphagia Currently this is moderately to poorly controlled. Medication(s): Metformin 500 mg once daily breakfast.  Reported side effects: None  Plan: Start Zepbound 2.5 mg SQ weekly and continue metformin 500 mg daily.  Will monitor response with Zepbound and titrate accordingly.  Continue to work on nutrition plan and exercise to promote weight loss.    ATTESTASTION STATEMENTS:  Reviewed by clinician on day of visit: allergies, medications, problem list, medical history, surgical history, family history, social history, and previous encounter notes.   I have personally spent 45 minutes total time today in preparation, patient care, nutritional counseling and documentation for this visit, including the following: review of clinical lab tests; review of medical tests/procedures/services.      Kahne Helfand, PA-C

## 2022-07-06 ENCOUNTER — Telehealth (INDEPENDENT_AMBULATORY_CARE_PROVIDER_SITE_OTHER): Payer: Self-pay

## 2022-07-06 NOTE — Telephone Encounter (Signed)
Crystal Simmons (Key: [id number]) - [id number] Zepbound 2.5MG /0.5ML pen-injectors Status: PA Response - DeniedCreated: May 21st, 2024 [phone]: May 21st, 2024  I called patient and informed her.

## 2022-07-17 NOTE — Telephone Encounter (Signed)
Patient stated expressed scripts stated they are going to pre-authorize PA for meds (Zepbound). The code to use is [id number]. It needs to be coded weight loss, that's why it wasn't approve.

## 2022-07-21 ENCOUNTER — Encounter (INDEPENDENT_AMBULATORY_CARE_PROVIDER_SITE_OTHER): Payer: Self-pay | Admitting: Physician Assistant

## 2022-07-24 ENCOUNTER — Telehealth (INDEPENDENT_AMBULATORY_CARE_PROVIDER_SITE_OTHER): Payer: Self-pay

## 2022-07-24 NOTE — Telephone Encounter (Signed)
I called and spoke with patient in regards to her previous message.  I informed patient that when I placed the PA in CoverMy meds the second time, that the code that she was given was not a code for a PA to be approved.  CoverMy meds did not take the code that she was given.  Patient explained that she wanted to push her appointment out further due to it being a follow up for medication, unless Ines Bloomer can recommend something different for patient to take.

## 2022-07-26 ENCOUNTER — Ambulatory Visit (INDEPENDENT_AMBULATORY_CARE_PROVIDER_SITE_OTHER): Payer: BC Managed Care – PPO | Admitting: Physician Assistant

## 2022-09-28 ENCOUNTER — Ambulatory Visit (INDEPENDENT_AMBULATORY_CARE_PROVIDER_SITE_OTHER): Payer: BC Managed Care – PPO | Admitting: Family Medicine

## 2022-09-28 ENCOUNTER — Encounter (INDEPENDENT_AMBULATORY_CARE_PROVIDER_SITE_OTHER): Payer: Self-pay | Admitting: Family Medicine

## 2022-09-28 ENCOUNTER — Other Ambulatory Visit (INDEPENDENT_AMBULATORY_CARE_PROVIDER_SITE_OTHER): Payer: Self-pay | Admitting: Physician Assistant

## 2022-09-28 VITALS — BP 109/76 | HR 72 | Temp 98.3°F | Ht 68.0 in | Wt 210.0 lb

## 2022-09-28 DIAGNOSIS — E559 Vitamin D deficiency, unspecified: Secondary | ICD-10-CM | POA: Diagnosis not present

## 2022-09-28 DIAGNOSIS — Z6832 Body mass index (BMI) 32.0-32.9, adult: Secondary | ICD-10-CM

## 2022-09-28 DIAGNOSIS — R7303 Prediabetes: Secondary | ICD-10-CM | POA: Diagnosis not present

## 2022-09-28 DIAGNOSIS — Z6831 Body mass index (BMI) 31.0-31.9, adult: Secondary | ICD-10-CM

## 2022-09-28 DIAGNOSIS — E669 Obesity, unspecified: Secondary | ICD-10-CM | POA: Diagnosis not present

## 2022-09-28 MED ORDER — WEGOVY 0.25 MG/0.5ML ~~LOC~~ SOAJ: 0.2500 mg | SUBCUTANEOUS | 0 refills | Status: DC

## 2022-09-28 NOTE — Progress Notes (Signed)
Chief Complaint:   OBESITY Crystal Simmons is here to discuss her progress with her obesity treatment plan along with follow-up of her obesity related diagnoses. Crystal Simmons is on keeping a food journal and adhering to recommended goals of 1200-1300 calories and 90+ grams of protein and states she is following her eating plan approximately 60% of the time. Crystal Simmons states she is lifting weights and walking for 30 minutes 1-4 times per week.  Today's visit was #: 29 Starting weight: 211 lbs Starting date: 05/01/2019 Today's weight: 210 lbs Today's date: 09/28/2022 Total lbs lost to date: 1 Total lbs lost since last in-office visit: 0  Interim History: Patient reports that she feels like she has not done as well.  She went on a cruise in June and feels like she has not been able to get back on track since then.  She also got a new position at work and she feels like she is in a good place to get back on track.  Her walking/ cardio has been consistent but her resistance training has not been where it needs to be.  Logging food has been as consistent as well.  Subjective:   1. Prediabetes Patient's last A1c was 5.9 and insulin 6.0.  She is on metformin 500 mg daily.  2. Vitamin D deficiency Patient is on OTC vitamin D, and she notes fatigue.  Assessment/Plan:   1. Prediabetes We will check labs today, and we will follow-up at patient's next appointment.  - Comprehensive metabolic panel - Hemoglobin A1c - Insulin, random  2. Vitamin D deficiency We will check labs today, and we will follow-up at patient's next appointment.  - VITAMIN D 25 Hydroxy (Vit-D Deficiency, Fractures)  3. BMI 32.0-32.9,adult  4. Obesity with starting BMI of 32.08 Patient agreed to start Wegovy 0.25 mg subcu weekly with no refills.  - Semaglutide-Weight Management (WEGOVY) 0.25 MG/0.5ML SOAJ; Inject 0.25 mg into the skin once a week.  Dispense: 2 mL; Refill: 0  Crystal Simmons is currently in the action stage of change. As  such, her goal is to continue with weight loss efforts. She has agreed to keeping a food journal and adhering to recommended goals of 1300 calories and 85+ grams of protein daily.   Exercise goals: All adults should avoid inactivity. Some physical activity is better than none, and adults who participate in any amount of physical activity gain some health benefits.  Behavioral modification strategies: increasing lean protein intake, meal planning and cooking strategies, keeping healthy foods in the home, planning for success, and keeping a strict food journal.  Crystal Simmons has agreed to follow-up with our clinic in 3 to 4 weeks. She was informed of the importance of frequent follow-up visits to maximize her success with intensive lifestyle modifications for her multiple health conditions.   Crystal Simmons was informed we would discuss her lab results at her next visit unless there is a critical issue that needs to be addressed sooner. Crystal Simmons agreed to keep her next visit at the agreed upon time to discuss these results.  Objective:   Blood pressure 109/76, pulse 72, temperature 98.3 F (36.8 C), height 5\' 8"  (1.727 m), weight 210 lb (95.3 kg), last menstrual period 10/14/2005, SpO2 99%. Body mass index is 31.93 kg/m.  General: Cooperative, alert, well developed, in no acute distress. HEENT: Conjunctivae and lids unremarkable. Cardiovascular: Regular rhythm.  Lungs: Normal work of breathing. Neurologic: No focal deficits.   Lab Results  Component Value Date   CREATININE 0.87 09/28/2022  BUN 13 09/28/2022   NA 141 09/28/2022   K 4.6 09/28/2022   CL 104 09/28/2022   CO2 23 09/28/2022   Lab Results  Component Value Date   ALT 16 09/28/2022   AST 17 09/28/2022   ALKPHOS 98 09/28/2022   BILITOT 0.2 09/28/2022   Lab Results  Component Value Date   HGBA1C 6.0 (H) 09/28/2022   HGBA1C 5.9 (H) 03/14/2022   HGBA1C 5.7 (H) 12/19/2021   HGBA1C 5.9 (H) 05/16/2021   HGBA1C 5.7 (H) 01/27/2021   Lab  Results  Component Value Date   INSULIN 10.6 09/28/2022   INSULIN 6.0 03/14/2022   INSULIN 13.2 12/19/2021   INSULIN 10.9 05/16/2021   INSULIN 13.1 08/18/2020   Lab Results  Component Value Date   TSH 2.720 03/14/2022   Lab Results  Component Value Date   CHOL 174 03/14/2022   HDL 69 03/14/2022   LDLCALC 92 03/14/2022   TRIG 71 03/14/2022   CHOLHDL 2.5 03/31/2020   Lab Results  Component Value Date   VD25OH 67.4 09/28/2022   VD25OH 52.3 03/14/2022   VD25OH 59.0 12/19/2021   Lab Results  Component Value Date   WBC 7.3 03/14/2022   HGB 12.2 03/14/2022   HCT 38.3 03/14/2022   MCV 97 03/14/2022   PLT 314 03/14/2022   Lab Results  Component Value Date   IRON 93 03/31/2020   TIBC 264 03/31/2020   FERRITIN 65 03/31/2020   Attestation Statements:   Reviewed by clinician on day of visit: allergies, medications, problem list, medical history, surgical history, family history, social history, and previous encounter notes.   I, Burt Knack, am acting as transcriptionist for Reuben Likes, MD.  I have reviewed the above documentation for accuracy and completeness, and I agree with the above. - Reuben Likes, MD

## 2022-09-29 LAB — COMPREHENSIVE METABOLIC PANEL
ALT: 16 IU/L (ref 0–32)
AST: 17 IU/L (ref 0–40)
Albumin: 4.2 g/dL (ref 3.9–4.9)
Alkaline Phosphatase: 98 IU/L (ref 44–121)
BUN/Creatinine Ratio: 15 (ref 12–28)
BUN: 13 mg/dL (ref 8–27)
Bilirubin Total: 0.2 mg/dL (ref 0.0–1.2)
CO2: 23 mmol/L (ref 20–29)
Calcium: 9.4 mg/dL (ref 8.7–10.3)
Chloride: 104 mmol/L (ref 96–106)
Creatinine, Ser: 0.87 mg/dL (ref 0.57–1.00)
Globulin, Total: 3.1 g/dL (ref 1.5–4.5)
Glucose: 93 mg/dL (ref 70–99)
Potassium: 4.6 mmol/L (ref 3.5–5.2)
Sodium: 141 mmol/L (ref 134–144)
Total Protein: 7.3 g/dL (ref 6.0–8.5)
eGFR: 75 mL/min/{1.73_m2} (ref >59)

## 2022-09-29 LAB — HEMOGLOBIN A1C
Est. average glucose Bld gHb Est-mCnc: 126 mg/dL
Hgb A1c MFr Bld: 6 % — ABNORMAL HIGH (ref 4.8–5.6)

## 2022-09-29 LAB — INSULIN, RANDOM: INSULIN: 10.6 u[IU]/mL (ref 2.6–24.9)

## 2022-09-29 LAB — VITAMIN D 25 HYDROXY (VIT D DEFICIENCY, FRACTURES): Vit D, 25-Hydroxy: 67.4 ng/mL (ref 30.0–100.0)

## 2022-10-02 ENCOUNTER — Other Ambulatory Visit (INDEPENDENT_AMBULATORY_CARE_PROVIDER_SITE_OTHER): Payer: Self-pay | Admitting: Family Medicine

## 2022-10-02 ENCOUNTER — Encounter (INDEPENDENT_AMBULATORY_CARE_PROVIDER_SITE_OTHER): Payer: Self-pay

## 2022-10-02 ENCOUNTER — Other Ambulatory Visit (INDEPENDENT_AMBULATORY_CARE_PROVIDER_SITE_OTHER): Payer: Self-pay

## 2022-10-02 ENCOUNTER — Telehealth (INDEPENDENT_AMBULATORY_CARE_PROVIDER_SITE_OTHER): Payer: Self-pay | Admitting: Family Medicine

## 2022-10-02 DIAGNOSIS — R7303 Prediabetes: Secondary | ICD-10-CM

## 2022-10-02 MED ORDER — METFORMIN HCL 500 MG PO TABS: ORAL_TABLET | ORAL | 0 refills | Status: DC

## 2022-10-02 NOTE — Telephone Encounter (Signed)
The patient stated that her Metformin was denied and asked for guidance on the next steps. Please reach out to the patient or message her through MyChart.

## 2022-10-02 NOTE — Telephone Encounter (Signed)
Rx sent to the pharmacy, PA started, waiting for note to send to plan 10/02/22

## 2022-10-04 ENCOUNTER — Encounter (INDEPENDENT_AMBULATORY_CARE_PROVIDER_SITE_OTHER): Payer: Self-pay

## 2022-10-30 ENCOUNTER — Other Ambulatory Visit (INDEPENDENT_AMBULATORY_CARE_PROVIDER_SITE_OTHER): Payer: Self-pay | Admitting: Family Medicine

## 2022-10-30 DIAGNOSIS — E669 Obesity, unspecified: Secondary | ICD-10-CM

## 2022-11-02 ENCOUNTER — Other Ambulatory Visit (INDEPENDENT_AMBULATORY_CARE_PROVIDER_SITE_OTHER): Payer: Self-pay | Admitting: Family Medicine

## 2022-11-02 ENCOUNTER — Ambulatory Visit (INDEPENDENT_AMBULATORY_CARE_PROVIDER_SITE_OTHER): Payer: BC Managed Care – PPO | Admitting: Family Medicine

## 2022-11-02 ENCOUNTER — Encounter (INDEPENDENT_AMBULATORY_CARE_PROVIDER_SITE_OTHER): Payer: Self-pay | Admitting: Family Medicine

## 2022-11-02 VITALS — BP 115/80 | HR 80 | Temp 98.2°F | Ht 68.0 in | Wt 209.0 lb

## 2022-11-02 DIAGNOSIS — K5900 Constipation, unspecified: Secondary | ICD-10-CM | POA: Diagnosis not present

## 2022-11-02 DIAGNOSIS — E669 Obesity, unspecified: Secondary | ICD-10-CM | POA: Diagnosis not present

## 2022-11-02 DIAGNOSIS — R7303 Prediabetes: Secondary | ICD-10-CM

## 2022-11-02 DIAGNOSIS — K5909 Other constipation: Secondary | ICD-10-CM

## 2022-11-02 DIAGNOSIS — Z6831 Body mass index (BMI) 31.0-31.9, adult: Secondary | ICD-10-CM

## 2022-11-02 MED ORDER — METFORMIN HCL 500 MG PO TABS
ORAL_TABLET | ORAL | 0 refills | Status: DC
Start: 2022-11-02 — End: 2022-11-29

## 2022-11-02 MED ORDER — WEGOVY 0.5 MG/0.5ML ~~LOC~~ SOAJ
0.5000 mg | SUBCUTANEOUS | 0 refills | Status: DC
Start: 2022-11-02 — End: 2022-11-29

## 2022-11-02 NOTE — Progress Notes (Signed)
.smr  Office: [phone]  /  Fax: [phone]  WEIGHT SUMMARY AND BIOMETRICS  Anthropometric Measurements Height: 5\' 8"  (1.727 m) Weight: 209 lb (94.8 kg) BMI (Calculated): 31.79 Weight at Last Visit: 210 lb Weight Lost Since Last Visit: 1 lb Weight Gained Since Last Visit: 0   Body Composition  Body Fat %: 39.9 % Fat Mass (lbs): 83.6 lbs Muscle Mass (lbs): 119.6 lbs Total Body Water (lbs): 85.6 lbs Visceral Fat Rating : 11   Other Clinical Data Fasting: Yes Labs: No Today's Visit #: 30    Chief Complaint: OBESITY   History of Present Illness   The patient, who is currently on Wegovy for obesity management, reports a weight loss of one pound over the past month. She has been adhering to a diet of approximately 1200 calories with at least 85 grams of protein, achieving this goal about 80% of the time. She has also been engaging in physical activity, including walking and weight lifting, for about 30 minutes four times per week.  Despite these efforts, the patient has not noticed any significant changes in her hunger levels since starting [facility] a month ago. She has completed four doses of the medication so far. The patient also reports constipation, which she believes may be affecting her weight loss. She has not been eating between meals and has experienced straining and bleeding during bowel movements. She recently started taking Metamucil to manage this issue.  The patient has been meeting her caloric and protein intake goals, consuming about 1200 calories and at least 80 grams of protein daily. She has been using a walking pad for exercise and also has a gym membership, although she is considering cancelling it due to convenience and cost. She has been taking metformin without any reported problems.          PHYSICAL EXAM:  Blood pressure 115/80, pulse 80, temperature 98.2 F (36.8 C), height 5\' 8"  (1.727 m), weight 209 lb (94.8 kg), last menstrual period  10/14/2005, SpO2 96%. Body mass index is 31.78 kg/m.  DIAGNOSTIC DATA REVIEWED:  BMET    Component Value Date/Time   NA 141 09/28/2022 0815   K 4.6 09/28/2022 0815   CL 104 09/28/2022 0815   CO2 23 09/28/2022 0815   GLUCOSE 93 09/28/2022 0815   GLUCOSE 88 12/13/2015 1104   BUN 13 09/28/2022 0815   CREATININE 0.87 09/28/2022 0815   CREATININE 0.88 12/13/2015 1104   CALCIUM 9.4 09/28/2022 0815   GFRNONAA 68 03/31/2020 0758   GFRAA 79 03/31/2020 0758   Lab Results  Component Value Date   HGBA1C 6.0 (H) 09/28/2022   HGBA1C 5.4 12/13/2015   Lab Results  Component Value Date   INSULIN 10.6 09/28/2022   INSULIN 8.9 05/01/2019   Lab Results  Component Value Date   TSH 2.720 03/14/2022   CBC    Component Value Date/Time   WBC 7.3 03/14/2022 0820   WBC 7.4 12/13/2015 1104   RBC 3.97 03/14/2022 0820   RBC 3.84 12/13/2015 1104   HGB 12.2 03/14/2022 0820   HGB 11.3 (L) 12/13/2015 1347   HCT 38.3 03/14/2022 0820   PLT 314 03/14/2022 0820   MCV 97 03/14/2022 0820   MCH 30.7 03/14/2022 0820   MCH 30.7 12/13/2015 1104   MCHC 31.9 03/14/2022 0820   MCHC 32.4 12/13/2015 1104   RDW 12.9 03/14/2022 0820   Iron Studies    Component Value Date/Time   IRON 93 03/31/2020 0758   TIBC 264 03/31/2020 0758  FERRITIN 65 03/31/2020 0758   IRONPCTSAT 35 03/31/2020 0758   Lipid Panel     Component Value Date/Time   CHOL 174 03/14/2022 0820   TRIG 71 03/14/2022 0820   HDL 69 03/14/2022 0820   CHOLHDL 2.5 03/31/2020 0758   CHOLHDL 2.7 12/13/2015 1104   VLDL 11 12/13/2015 1104   LDLCALC 92 03/14/2022 0820   Hepatic Function Panel     Component Value Date/Time   PROT 7.3 09/28/2022 0815   ALBUMIN 4.2 09/28/2022 0815   AST 17 09/28/2022 0815   ALT 16 09/28/2022 0815   ALKPHOS 98 09/28/2022 0815   BILITOT 0.2 09/28/2022 0815      Component Value Date/Time   TSH 2.720 03/14/2022 0820   Nutritional Lab Results  Component Value Date   VD25OH 67.4 09/28/2022   VD25OH  52.3 03/14/2022   VD25OH 59.0 12/19/2021     Assessment and Plan    Obesity Patient is on Wegovy and has lost 1 pound in the past month. She is maintaining a diet of approximately 1200 calories with at least 85 grams of protein and is exercising regularly. However, she reports constipation and no change in hunger since starting [facility]. -Increase Wegovy from 0.25 to 0.5mg . -Check-in in 4 weeks to assess response to increased Wegovy dose.    Prediabetes Stable on metformin with no GI upset  -Continue Metformin, refill prescription.   Constipation -Recommend daily use of Miralax for constipation. -Encourage continued diet and exercise regimen.  General Health Maintenance -Continue monitoring weight loss progress and adjust treatment plan as necessary.        She was informed of the importance of frequent follow up visits to maximize her success with intensive lifestyle modifications for her multiple health conditions.    Quillian Quince, MD

## 2022-11-17 DIAGNOSIS — H40053 Ocular hypertension, bilateral: Secondary | ICD-10-CM | POA: Diagnosis not present

## 2022-11-29 ENCOUNTER — Ambulatory Visit (INDEPENDENT_AMBULATORY_CARE_PROVIDER_SITE_OTHER): Payer: BC Managed Care – PPO | Admitting: Family Medicine

## 2022-11-29 ENCOUNTER — Encounter (INDEPENDENT_AMBULATORY_CARE_PROVIDER_SITE_OTHER): Payer: Self-pay | Admitting: Family Medicine

## 2022-11-29 VITALS — BP 113/79 | HR 85 | Temp 98.5°F | Ht 68.0 in | Wt 207.0 lb

## 2022-11-29 DIAGNOSIS — Z6831 Body mass index (BMI) 31.0-31.9, adult: Secondary | ICD-10-CM

## 2022-11-29 DIAGNOSIS — E66811 Obesity, class 1: Secondary | ICD-10-CM

## 2022-11-29 DIAGNOSIS — E669 Obesity, unspecified: Secondary | ICD-10-CM

## 2022-11-29 DIAGNOSIS — E559 Vitamin D deficiency, unspecified: Secondary | ICD-10-CM | POA: Diagnosis not present

## 2022-11-29 DIAGNOSIS — R7303 Prediabetes: Secondary | ICD-10-CM

## 2022-11-29 DIAGNOSIS — Z6832 Body mass index (BMI) 32.0-32.9, adult: Secondary | ICD-10-CM

## 2022-11-29 MED ORDER — WEGOVY 1 MG/0.5ML ~~LOC~~ SOAJ: 1.0000 mg | SUBCUTANEOUS | 0 refills | Status: DC

## 2022-11-29 MED ORDER — METFORMIN HCL 500 MG PO TABS
ORAL_TABLET | ORAL | 0 refills | Status: DC
Start: 2022-11-29 — End: 2023-03-06

## 2022-11-29 NOTE — Progress Notes (Signed)
Chief Complaint:   OBESITY Crystal Simmons is here to discuss her progress with her obesity treatment plan along with follow-up of her obesity related diagnoses. Crystal Simmons is on keeping a food journal and adhering to recommended goals of 1200 calories and 85+ grams of protein and states she is following her eating plan approximately 90% of the time. Crystal Simmons states she is doing cardio and weights for 30 minutes 1-4 times per week.  Today's visit was #: 31 Starting weight: 211 lbs Starting date: 05/01/2019 Today's weight: 207 lbs Today's date: 11/29/2022 Total lbs lost to date: 4 Total lbs lost since last in-office visit: 2  Interim History: Patient presents for follow up after increase of Wegovy to 0.5mg .  She is sticking to 1200 calories and 80g of protein daily and exercising.  She is feeling frustrated with lack of weight loss on Wegovy. She has been a bit hungry on the dose of 0.5mg .  She switched her intermittent fasting to longer length of time to eat due to hunger.  She can tell when its time to eat.  Next few weeks she will be in [place] for November 9-15th for work.  December she is going to [place].    Subjective:   1. Pre-diabetes Patient's last A1c was 6.0. She is on metformin with no GI side effects noted. Her last labs were in August 2024.  2. Vitamin D deficiency Patient is not on prescription Vitamin D, and she notes fatigue. Her last Vit D level was of 67.4 in August.   Assessment/Plan:   1. Pre-diabetes We will refill metformin 500 mg once daily for 90 days; not yet at goal.   - metFORMIN (GLUCOPHAGE) 500 MG tablet; TAKE 1 TABLET BY MOUTH EVERY DAY WITH LUNCH  Dispense: 90 tablet; Refill: 0  2. Vitamin D deficiency Patient will continue OTC Vitamin D; level at goal.   3. BMI 31.0-31.9,adult  4. Obesity with starting BMI of 32.0 Patient agreed to increase Wegovy to 1 mg SubQ weekly, and we will refill for 1 month.   - Semaglutide-Weight Management (WEGOVY) 1 MG/0.5ML  SOAJ; Inject 1 mg into the skin once a week.  Dispense: 2 mL; Refill: 0  Crystal Simmons is currently in the action stage of change. As such, her goal is to continue with weight loss efforts. She has agreed to keeping a food journal and adhering to recommended goals of 1200 calories and 85+ grams of protein daily.   Exercise goals: Patient is to swap frequency of cardio versus weights.   Behavioral modification strategies: increasing lean protein intake, meal planning and cooking strategies, keeping healthy foods in the home, and planning for success.  Crystal Simmons has agreed to follow-up with our clinic in 4 weeks. She was informed of the importance of frequent follow-up visits to maximize her success with intensive lifestyle modifications for her multiple health conditions.   Objective:   Blood pressure 113/79, pulse 85, temperature 98.5 F (36.9 C), height 5\' 8"  (1.727 m), weight 207 lb (93.9 kg), last menstrual period 10/14/2005. Body mass index is 31.47 kg/m.  General: Cooperative, alert, well developed, in no acute distress. HEENT: Conjunctivae and lids unremarkable. Cardiovascular: Regular rhythm.  Lungs: Normal work of breathing. Neurologic: No focal deficits.   Lab Results  Component Value Date   CREATININE 0.87 09/28/2022   BUN 13 09/28/2022   NA 141 09/28/2022   K 4.6 09/28/2022   CL 104 09/28/2022   CO2 23 09/28/2022   Lab Results  Component Value  Date   ALT 16 09/28/2022   AST 17 09/28/2022   ALKPHOS 98 09/28/2022   BILITOT 0.2 09/28/2022   Lab Results  Component Value Date   HGBA1C 6.0 (H) 09/28/2022   HGBA1C 5.9 (H) 03/14/2022   HGBA1C 5.7 (H) 12/19/2021   HGBA1C 5.9 (H) 05/16/2021   HGBA1C 5.7 (H) 01/27/2021   Lab Results  Component Value Date   INSULIN 10.6 09/28/2022   INSULIN 6.0 03/14/2022   INSULIN 13.2 12/19/2021   INSULIN 10.9 05/16/2021   INSULIN 13.1 08/18/2020   Lab Results  Component Value Date   TSH 2.720 03/14/2022   Lab Results  Component  Value Date   CHOL 174 03/14/2022   HDL 69 03/14/2022   LDLCALC 92 03/14/2022   TRIG 71 03/14/2022   CHOLHDL 2.5 03/31/2020   Lab Results  Component Value Date   VD25OH 67.4 09/28/2022   VD25OH 52.3 03/14/2022   VD25OH 59.0 12/19/2021   Lab Results  Component Value Date   WBC 7.3 03/14/2022   HGB 12.2 03/14/2022   HCT 38.3 03/14/2022   MCV 97 03/14/2022   PLT 314 03/14/2022   Lab Results  Component Value Date   IRON 93 03/31/2020   TIBC 264 03/31/2020   FERRITIN 65 03/31/2020   Attestation Statements:   Reviewed by clinician on day of visit: allergies, medications, problem list, medical history, surgical history, family history, social history, and previous encounter notes.   I, Burt Knack, am acting as transcriptionist for Reuben Likes, MD.  I have reviewed the above documentation for accuracy and completeness, and I agree with the above. - Reuben Likes, MD

## 2022-12-25 ENCOUNTER — Other Ambulatory Visit (INDEPENDENT_AMBULATORY_CARE_PROVIDER_SITE_OTHER): Payer: Self-pay

## 2022-12-25 ENCOUNTER — Encounter (INDEPENDENT_AMBULATORY_CARE_PROVIDER_SITE_OTHER): Payer: Self-pay

## 2022-12-25 ENCOUNTER — Telehealth (INDEPENDENT_AMBULATORY_CARE_PROVIDER_SITE_OTHER): Payer: Self-pay | Admitting: Family Medicine

## 2022-12-25 DIAGNOSIS — E66811 Obesity, class 1: Secondary | ICD-10-CM

## 2022-12-25 MED ORDER — WEGOVY 1 MG/0.5ML ~~LOC~~ SOAJ: 1.0000 mg | SUBCUTANEOUS | 0 refills | Status: DC

## 2022-12-25 NOTE — Telephone Encounter (Signed)
11/11 Called pt to R/S appt as Dr. Marquis Lunch will be out of the office. Pt states that she look her last Wegovy on this past Sunday (Yesterday 12/24/2022) and needs a refill. Pt wants to know if Dr. Marquis Lunch would refill the [facility] since this appt has been cancelled.

## 2022-12-25 NOTE — Telephone Encounter (Signed)
Rx sent 

## 2023-01-08 ENCOUNTER — Ambulatory Visit (INDEPENDENT_AMBULATORY_CARE_PROVIDER_SITE_OTHER): Payer: BC Managed Care – PPO | Admitting: Family Medicine

## 2023-01-08 DIAGNOSIS — E669 Obesity, unspecified: Secondary | ICD-10-CM | POA: Diagnosis not present

## 2023-01-08 DIAGNOSIS — R002 Palpitations: Secondary | ICD-10-CM | POA: Diagnosis not present

## 2023-01-08 DIAGNOSIS — Z0000 Encounter for general adult medical examination without abnormal findings: Secondary | ICD-10-CM | POA: Diagnosis not present

## 2023-01-08 DIAGNOSIS — R5383 Other fatigue: Secondary | ICD-10-CM | POA: Diagnosis not present

## 2023-01-08 DIAGNOSIS — Z23 Encounter for immunization: Secondary | ICD-10-CM | POA: Diagnosis not present

## 2023-01-08 DIAGNOSIS — Z6831 Body mass index (BMI) 31.0-31.9, adult: Secondary | ICD-10-CM | POA: Diagnosis not present

## 2023-01-24 ENCOUNTER — Ambulatory Visit (INDEPENDENT_AMBULATORY_CARE_PROVIDER_SITE_OTHER): Payer: BC Managed Care – PPO | Admitting: Family Medicine

## 2023-01-24 VITALS — BP 106/73 | HR 94 | Temp 98.7°F | Ht 68.0 in | Wt 204.0 lb

## 2023-01-24 DIAGNOSIS — R7303 Prediabetes: Secondary | ICD-10-CM | POA: Diagnosis not present

## 2023-01-24 DIAGNOSIS — Z6831 Body mass index (BMI) 31.0-31.9, adult: Secondary | ICD-10-CM

## 2023-01-24 DIAGNOSIS — E559 Vitamin D deficiency, unspecified: Secondary | ICD-10-CM

## 2023-01-24 DIAGNOSIS — E669 Obesity, unspecified: Secondary | ICD-10-CM

## 2023-01-24 DIAGNOSIS — E66811 Obesity, class 1: Secondary | ICD-10-CM | POA: Diagnosis not present

## 2023-01-24 MED ORDER — WEGOVY 1.7 MG/0.75ML ~~LOC~~ SOAJ: 1.7000 mg | SUBCUTANEOUS | 0 refills | Status: DC

## 2023-01-24 NOTE — Assessment & Plan Note (Signed)
Last A1c of 6.0.  Patient is now on GLP1.  She is logging food and ensuring adequate protein intake daily.  Not experiencing many cravings. Fasting Insulin, CMP, A1c ordered today. Will discuss lab results at next appointment.

## 2023-01-24 NOTE — Progress Notes (Signed)
SUBJECTIVE:  Chief Complaint: Obesity  Interim History: Patient has been sticking to plan as much as she can.  She feels like she is ready to go up in dosage of the wegovy.  She is able to get all the food in and is feeling tired of everything food wise. It isn't that she isn't hungry but she is disinterested in food choices.  For Thanksgiving she baked [place], had greens and a quarter cup of rice.  She stayed home for the holiday.  Leaves for [place] on December 23rd.  She is packing protein shakes to bring with her.  She is taking miralax sometimes twice a day.  Crystal Simmons is here to discuss her progress with her obesity treatment plan. She is on the keeping a food journal and adhering to recommended goals of 1200 calories and 85+ grams of protein and states she is following her eating plan approximately 80 % of the time. She states she is exercising 30 minutes 4 times per week.   OBJECTIVE: Visit Diagnoses: Problem List Items Addressed This Visit       Other   Prediabetes    Last A1c of 6.0.  Patient is now on GLP1.  She is logging food and ensuring adequate protein intake daily.  Not experiencing many cravings. Fasting Insulin, CMP, A1c ordered today. Will discuss lab results at next appointment.      Relevant Orders   Comprehensive metabolic panel   Hemoglobin A1c   Insulin, random   Vitamin D deficiency    Last Vitamin D level of 67 in August.  No nausea, vomiting or muscle weakness. She is on OTC vitamin d.  Vitamin D level today.      Relevant Orders   VITAMIN D 25 Hydroxy (Vit-D Deficiency, Fractures)   Other Visit Diagnoses     Class 1 obesity with serious comorbidity and body mass index (BMI) of 32.0 to 32.9 in adult, unspecified obesity type    -  Primary   Relevant Medications   Semaglutide-Weight Management (WEGOVY) 1.7 MG/0.75ML SOAJ       Vitals Temp: 98.7 F (37.1 C) BP: 106/73 Pulse Rate: 94 SpO2: 98 %   Anthropometric Measurements Height: 5\' 8"  (1.727  m) Weight: 204 lb (92.5 kg) BMI (Calculated): 31.03 Weight at Last Visit: 207 lb Weight Lost Since Last Visit: 3 Weight Gained Since Last Visit: 0 Starting Weight: 211 lb Total Weight Loss (lbs): 7 lb (3.175 kg)   Body Composition  Body Fat %: 40 % Fat Mass (lbs): 81.8 lbs Muscle Mass (lbs): 116.4 lbs Total Body Water (lbs): 84.4 lbs Visceral Fat Rating : 11   Other Clinical Data Today's Visit #: 32 Starting Date: 05/01/19     ASSESSMENT AND PLAN:  Diet: Idabel is currently in the action stage of change. As such, her goal is to continue with weight loss efforts. She has agreed to keeping a food journal and adhering to recommended goals of 1200 calories and 80 or more grams of protein daily.  Exercise: Dekyra has been instructed to continue exercising as is for weight loss and overall health benefits.   Behavior Modification:  We discussed the following Behavioral Modification Strategies today: increasing lean protein intake, increasing vegetables, meal planning and cooking strategies, holiday eating strategies, and planning for success. We discussed various medication options to help Crystal Simmons with her weight loss efforts and we both agreed to increase Wegovy to 1.7mg  weekly.  No follow-ups on file.Marland Kitchen She was informed of the importance of  frequent follow up visits to maximize her success with intensive lifestyle modifications for her multiple health conditions.  Attestation Statements:   Reviewed by clinician on day of visit: allergies, medications, problem list, medical history, surgical history, family history, social history, and previous encounter notes.      Reuben Likes, MD

## 2023-01-24 NOTE — Assessment & Plan Note (Signed)
Last Vitamin D level of 67 in August.  No nausea, vomiting or muscle weakness. She is on OTC vitamin d.  Vitamin D level today.

## 2023-01-25 LAB — COMPREHENSIVE METABOLIC PANEL
ALT: 10 [IU]/L (ref 0–32)
AST: 14 [IU]/L (ref 0–40)
Albumin: 4.1 g/dL (ref 3.9–4.9)
Alkaline Phosphatase: 104 [IU]/L (ref 44–121)
BUN/Creatinine Ratio: 14 (ref 12–28)
BUN: 13 mg/dL (ref 8–27)
Bilirubin Total: 0.6 mg/dL (ref 0.0–1.2)
CO2: 24 mmol/L (ref 20–29)
Calcium: 9.6 mg/dL (ref 8.7–10.3)
Chloride: 103 mmol/L (ref 96–106)
Creatinine, Ser: 0.94 mg/dL (ref 0.57–1.00)
Globulin, Total: 3.1 g/dL (ref 1.5–4.5)
Glucose: 92 mg/dL (ref 70–99)
Potassium: 4.5 mmol/L (ref 3.5–5.2)
Sodium: 140 mmol/L (ref 134–144)
Total Protein: 7.2 g/dL (ref 6.0–8.5)
eGFR: 69 mL/min/{1.73_m2} (ref >59)

## 2023-01-25 LAB — HEMOGLOBIN A1C
Est. average glucose Bld gHb Est-mCnc: 126 mg/dL
Hgb A1c MFr Bld: 6 % — ABNORMAL HIGH (ref 4.8–5.6)

## 2023-01-25 LAB — INSULIN, RANDOM: INSULIN: 14.4 u[IU]/mL (ref 2.6–24.9)

## 2023-01-25 LAB — VITAMIN D 25 HYDROXY (VIT D DEFICIENCY, FRACTURES): Vit D, 25-Hydroxy: 61.9 ng/mL (ref 30.0–100.0)

## 2023-02-13 ENCOUNTER — Encounter: Payer: Self-pay | Admitting: Radiology

## 2023-02-13 ENCOUNTER — Ambulatory Visit (INDEPENDENT_AMBULATORY_CARE_PROVIDER_SITE_OTHER): Payer: BC Managed Care – PPO | Admitting: Radiology

## 2023-02-13 VITALS — BP 120/74 | HR 112 | Ht 68.0 in | Wt 208.0 lb

## 2023-02-13 DIAGNOSIS — N951 Menopausal and female climacteric states: Secondary | ICD-10-CM

## 2023-02-13 DIAGNOSIS — R6882 Decreased libido: Secondary | ICD-10-CM

## 2023-02-13 DIAGNOSIS — E349 Endocrine disorder, unspecified: Secondary | ICD-10-CM | POA: Diagnosis not present

## 2023-02-13 DIAGNOSIS — Z01419 Encounter for gynecological examination (general) (routine) without abnormal findings: Secondary | ICD-10-CM | POA: Diagnosis not present

## 2023-02-13 DIAGNOSIS — N958 Other specified menopausal and perimenopausal disorders: Secondary | ICD-10-CM

## 2023-02-13 DIAGNOSIS — E559 Vitamin D deficiency, unspecified: Secondary | ICD-10-CM | POA: Diagnosis not present

## 2023-02-13 DIAGNOSIS — N952 Postmenopausal atrophic vaginitis: Secondary | ICD-10-CM | POA: Diagnosis not present

## 2023-02-13 MED ORDER — ESTRADIOL 10 MCG VA TABS: 1.0000 | ORAL_TABLET | VAGINAL | 11 refills | Status: DC

## 2023-02-13 NOTE — Progress Notes (Signed)
   Crystal Simmons [birth year] [id number]   History: Postmenopausal 62 y.o. presents for annual exam. Continues to have dyspareunia, low libido. Did not fill imvexxy  Rx last year- insurance did not cover. Would like to try something else, estrace  was too messy.    Gynecologic History Postmenopausal Last Pap: 01/2019. Results were: normal Last mammogram: 06/06/22. Results were: normal Last colonoscopy: 04/29/21   Obstetric History OB History  Gravida Para Term Preterm AB Living  0 0 0 0 0 0  SAB IAB Ectopic Multiple Live Births  0 0 0 0 0     The following portions of the patient's history were reviewed and updated as appropriate: allergies, current medications, past family history, past medical history, past social history, past surgical history, and problem list.  Review of Systems Pertinent items noted in HPI and remainder of comprehensive ROS otherwise negative.  Past medical history, past surgical history, family history and social history were all reviewed and documented in the EPIC chart.  Exam:  Vitals:   02/13/23 0904  BP: 120/74  Pulse: (!) 112  SpO2: 95%  Weight: 208 lb (94.3 kg)  Height: 5' 8 (1.727 m)   Body mass index is 31.63 kg/m.  General appearance:  Normal Thyroid :  Symmetrical, normal in size, without palpable masses or nodularity. Respiratory  Auscultation:  Clear without wheezing or rhonchi Cardiovascular  Auscultation:  Regular rate, without rubs, murmurs or gallops  Edema/varicosities:  Not grossly evident Abdominal  Soft,nontender, without masses, guarding or rebound.  Liver/spleen:  No organomegaly noted  Hernia:  None appreciated  Skin  Inspection:  Grossly normal Breasts: Examined lying and sitting.   Right: Without masses, retractions, nipple discharge or axillary adenopathy.   Left: Without masses, retractions, nipple discharge or axillary adenopathy. Genitourinary   Inguinal/mons:  Normal without inguinal adenopathy  External  genitalia:  Normal appearing vulva with no masses, tenderness, or lesions  BUS/Urethra/Skene's glands:  Normal  Vagina:  Normal appearing with normal color and discharge, no lesions. Atrophy: mild   Cervix:  Normal appearing without discharge or lesions  Uterus:  Normal in size, shape and contour.  Midline and mobile, nontender  Adnexa/parametria:     Rt: Normal in size, without masses or tenderness.   Lt: Normal in size, without masses or tenderness.  Anus and perineum: Normal    Darice Hoit, CMA present for exam  Assessment/Plan:   1. Well woman exam with routine gynecological exam (Primary) Pap 2025  2. Low libido - Testos,Total,Free and SHBG (Female)  3. Genitourinary syndrome of menopause - Estradiol  (YUVAFEM ) 10 MCG TABS vaginal tablet; Place 1 tablet (10 mcg total) vaginally 2 (two) times a week.  Dispense: 8 tablet; Refill: 11    Discussed SBE, colonoscopy and DEXA screening as directed. Recommend of exercise weekly, including weight bearing exercise.   Return in 1 year for annual or sooner prn.  Ewelina Naves B WHNP-BC, 9:17 AM 02/13/2023

## 2023-02-13 NOTE — Patient Instructions (Signed)
 Preventive Care 58-62 Years Old, Female  Preventive care refers to lifestyle choices and visits with your health care provider that can promote health and wellness. Preventive care visits are also called wellness exams.  What can I expect for my preventive care visit?  Counseling  Your health care provider may ask you questions about your:  Medical history, including:  Past medical problems.  Family medical history.  Pregnancy history.  Current health, including:  Menstrual cycle.  Method of birth control.  Emotional well-being.  Home life and relationship well-being.  Sexual activity and sexual health.  Lifestyle, including:  Alcohol, nicotine or tobacco, and drug use.  Access to firearms.  Diet, exercise, and sleep habits.  Work and work Astronomer.  Sunscreen use.  Safety issues such as seatbelt and bike helmet use.  Physical exam  Your health care provider will check your:  Height and weight. These may be used to calculate your BMI (body mass index). BMI is a measurement that tells if you are at a healthy weight.  Waist circumference. This measures the distance around your waistline. This measurement also tells if you are at a healthy weight and may help predict your risk of certain diseases, such as type 2 diabetes and high blood pressure.  Heart rate and blood pressure.  Body temperature.  Skin for abnormal spots.  What immunizations do I need?    Vaccines are usually given at various ages, according to a schedule. Your health care provider will recommend vaccines for you based on your age, medical history, and lifestyle or other factors, such as travel or where you work.  What tests do I need?  Screening  Your health care provider may recommend screening tests for certain conditions. This may include:  Lipid and cholesterol levels.  Diabetes screening. This is done by checking your blood sugar (glucose) after you have not eaten for a while (fasting).  Pelvic exam and Pap test.  Hepatitis B test.  Hepatitis C  test.  HIV (human immunodeficiency virus) test.  STI (sexually transmitted infection) testing, if you are at risk.  Lung cancer screening.  Colorectal cancer screening.  Mammogram. Talk with your health care provider about when you should start having regular mammograms. This may depend on whether you have a family history of breast cancer.  BRCA-related cancer screening. This may be done if you have a family history of breast, ovarian, tubal, or peritoneal cancers.  Bone density scan. This is done to screen for osteoporosis.  Talk with your health care provider about your test results, treatment options, and if necessary, the need for more tests.  Follow these instructions at home:  Eating and drinking    Eat a diet that includes fresh fruits and vegetables, whole grains, lean protein, and low-fat dairy products.  Take vitamin and mineral supplements as recommended by your health care provider.  Do not drink alcohol if:  Your health care provider tells you not to drink.  You are pregnant, may be pregnant, or are planning to become pregnant.  If you drink alcohol:  Limit how much you have to 0-1 drink a day.  Know how much alcohol is in your drink. In the [place] one drink equals one 12 oz bottle of beer (355 mL), one 5 oz glass of wine (148 mL), or one 1 oz glass of hard liquor (44 mL).  Lifestyle  Brush your teeth every morning and night with fluoride toothpaste. Floss one time each day.  Exercise for at least  30 minutes 5 or more days each week.  Do not use any products that contain nicotine or tobacco. These products include cigarettes, chewing tobacco, and vaping devices, such as e-cigarettes. If you need help quitting, ask your health care provider.  Do not use drugs.  If you are sexually active, practice safe sex. Use a condom or other form of protection to prevent STIs.  If you do not wish to become pregnant, use a form of birth control. If you plan to become pregnant, see your health care provider for a  prepregnancy visit.  Take aspirin only as told by your health care provider. Make sure that you understand how much to take and what form to take. Work with your health care provider to find out whether it is safe and beneficial for you to take aspirin daily.  Find healthy ways to manage stress, such as:  Meditation, yoga, or listening to music.  Journaling.  Talking to a trusted person.  Spending time with friends and family.  Minimize exposure to UV radiation to reduce your risk of skin cancer.  Safety  Always wear your seat belt while driving or riding in a vehicle.  Do not drive:  If you have been drinking alcohol. Do not ride with someone who has been drinking.  When you are tired or distracted.  While texting.  If you have been using any mind-altering substances or drugs.  Wear a helmet and other protective equipment during sports activities.  If you have firearms in your house, make sure you follow all gun safety procedures.  Seek help if you have been physically or sexually abused.  What's next?  Visit your health care provider once a year for an annual wellness visit.  Ask your health care provider how often you should have your eyes and teeth checked.  Stay up to date on all vaccines.  This information is not intended to replace advice given to you by your health care provider. Make sure you discuss any questions you have with your health care provider.  Document Revised: 07/28/2020 Document Reviewed: 07/28/2020  Elsevier Patient Education  2024 [employer].

## 2023-02-20 LAB — TESTOS,TOTAL,FREE AND SHBG (FEMALE)
Free Testosterone: 1 pg/mL (ref 0.1–6.4)
Sex Hormone Binding: 75.5 nmol/L — ABNORMAL HIGH (ref 14–73)
Testosterone, Total, LC-MS-MS: 11 ng/dL (ref 2–45)

## 2023-02-20 MED ORDER — NONFORMULARY OR COMPOUNDED ITEM: 5 refills | Status: AC

## 2023-02-20 NOTE — Telephone Encounter (Signed)
 Please call in Rx for testosterone PLO gel 4% to use a pea sized amount nightly to inner [facility] to [facility]

## 2023-02-23 ENCOUNTER — Encounter: Payer: Self-pay | Admitting: Cardiology

## 2023-02-23 ENCOUNTER — Ambulatory Visit: Payer: BC Managed Care – PPO | Attending: Cardiology | Admitting: Cardiology

## 2023-02-23 VITALS — BP 120/80 | HR 72 | Ht 68.0 in | Wt 206.2 lb

## 2023-02-23 DIAGNOSIS — I493 Ventricular premature depolarization: Secondary | ICD-10-CM

## 2023-02-23 DIAGNOSIS — R7303 Prediabetes: Secondary | ICD-10-CM

## 2023-02-23 DIAGNOSIS — I471 Supraventricular tachycardia, unspecified: Secondary | ICD-10-CM | POA: Diagnosis not present

## 2023-02-23 DIAGNOSIS — E782 Mixed hyperlipidemia: Secondary | ICD-10-CM

## 2023-02-23 DIAGNOSIS — I4729 Other ventricular tachycardia: Secondary | ICD-10-CM

## 2023-02-23 MED ORDER — METOPROLOL SUCCINATE ER 25 MG PO TB24: 12.5000 mg | ORAL_TABLET | Freq: Every day | ORAL | 3 refills | Status: DC

## 2023-02-23 MED ORDER — METOPROLOL SUCCINATE ER 25 MG PO TB24: 25.0000 mg | ORAL_TABLET | Freq: Every day | ORAL | 3 refills | Status: DC

## 2023-02-23 NOTE — Patient Instructions (Addendum)
 Medication Instructions:  Your physician has recommended you make the following change in your medication:  INCREASE: Metoprolol  succinate (Toprol -XL) 25 mg once daily *If you need a refill on your cardiac medications before your next appointment, please call your pharmacy*   Follow-Up: At [facility], you and your health needs are our priority.  As part of our continuing mission to provide you with exceptional heart care, we have created designated Provider Care Teams.  These Care Teams include your primary Cardiologist (physician) and Advanced Practice Providers (APPs -  Physician Assistants and Nurse Practitioners) who all work together to provide you with the care you need, when you need it.  Your next appointment:   1 year(s)  Provider:   Kardie Tobb, DO     Other Instructions:

## 2023-02-23 NOTE — Progress Notes (Signed)
 Cardiology Office Note:    Date:  02/23/2023   ID:  Crystal Simmons, DOB 1960-06-02, MRN [id number]  PCP:  Cleotilde Planas, MD  Cardiologist:  Dub Huntsman, DO  Electrophysiologist:  None   Referring MD: Cleotilde Planas, MD    I am doing well  History of Present Illness:    Crystal Simmons is a 63 y.o. female with a hx of vitamin D  deficiency, prediabetes, NSVT, PSVT, GERD here today for follow-up visit.  Last visit in December 2022 this was a virtual visit we discussed her monitor results.  And I started the patient on Toprol -XL 1.5 mg daily.    Since her last visit with me she has been doing well.  She offers no complaints in terms-hospitalization or urgent care.  She reports that she still experience some palpitations.  Not as often but they are still there.   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   Insomnia    Joint pain    Lactose intolerance    Menopausal state 2007   Seasonal allergies    Shingles 04/2018   STD (sexually transmitted disease) 8/09   chlamydia   Vitamin D  deficiency disease 2010    Past Surgical History:  Procedure Laterality Date   BREAST SURGERY Left 6/03   Biopsy negative   CERVIX LESION DESTRUCTION  4/92   COLONOSCOPY W/ BIOPSIES  09/26/10   polyp benign   COLPOSCOPY  10/12   CIN I   KNEE SURGERY  12/2009   left for meniscal tear   LAPAROSCOPY  7/02   multiple surgeries, including LSO, LOA, and endometrioma, and fibroids secondary to endometriosis   UMBILICAL EXPLORATION  6/05   for endometrioma     Current Medications: Current Meds  Medication Sig   Ascorbic Acid (VITAMIN C PO) Take by mouth.   cetirizine (ZYRTEC) 10 MG tablet Take 10 mg by mouth daily.   Cholecalciferol (VITAMIN D3) 50 MCG (2000 UT) TABS Take by mouth.   ELDERBERRY PO  Take by mouth. + zinc   Estradiol  (YUVAFEM ) 10 MCG TABS vaginal tablet Place 1 tablet (10 mcg total) vaginally 2 (two) times a week.   fish oil-omega-3 fatty acids 1000 MG capsule Take 2 g by mouth daily.   fluticasone (FLONASE) 50 MCG/ACT nasal spray Place 2 sprays into the nose daily.   metFORMIN  (GLUCOPHAGE ) 500 MG tablet TAKE 1 TABLET BY MOUTH EVERY DAY WITH LUNCH   metoprolol  succinate (TOPROL  XL) 25 MG 24 hr tablet Take 0.5 tablets (12.5 mg total) by mouth daily.   Multiple Vitamins-Minerals (MULTIVITAMIN WITH MINERALS) tablet Take 1 tablet by mouth daily.   NONFORMULARY OR COMPOUNDED ITEM 30 gram, Testosterone  PLO Gel or Anhydrous Versabase 4%, apply pea sized amount to inner thigh at bedtime   PREBIOTIC PRODUCT PO Take by mouth.   Semaglutide -Weight Management (WEGOVY ) 1.7 MG/0.75ML SOAJ Inject 1.7 mg into the skin once a week.     Allergies:   Phentermine  hcl, Sulfa antibiotics, and Codeine   Social History   Socioeconomic History   Marital status: Single    Spouse name: Not on file   Number of children: 0   Years of education: Not on file   Highest education level: Not  on file  Occupational History   Occupation: COLLECTION [employer]    Employer: [employer]  Tobacco Use   Smoking status: Never    Passive exposure: Never   Smokeless tobacco: Never  Vaping Use   Vaping status: Never Used  Substance and Sexual Activity   Alcohol use: Yes    Comment: 2-3 times a month   Drug use: No   Sexual activity: Not Currently    Partners: Male    Birth control/protection: Post-menopausal, None    Comment: menarche 63yo, sexual debut 63yo  Other Topics Concern   Not on file  Social History Narrative   Not on file   Social Drivers of Health   Financial Resource Strain: Not on file  Food Insecurity: Not on file  Transportation Needs: Not on file  Physical Activity: Not on file  Stress: Not on file  Social Connections: Not on file     Family History: The  patient's family history includes Diabetes in her brother, father, and paternal grandfather; Heart disease in her father; Hypertension in her father and mother; Sudden death in her father.  ROS:   Review of Systems  Constitution: Negative for decreased appetite, fever and weight gain.  HENT: Negative for congestion, ear discharge, hoarse voice and sore throat.   Eyes: Negative for discharge, redness, vision loss in right eye and visual halos.  Cardiovascular: Negative for chest pain, dyspnea on exertion, leg swelling, orthopnea and palpitations.  Respiratory: Negative for cough, hemoptysis, shortness of breath and snoring.   Endocrine: Negative for heat intolerance and polyphagia.  Hematologic/Lymphatic: Negative for bleeding problem. Does not bruise/bleed easily.  Skin: Negative for flushing, nail changes, rash and suspicious lesions.  Musculoskeletal: Negative for arthritis, joint pain, muscle cramps, myalgias, neck pain and stiffness.  Gastrointestinal: Negative for abdominal pain, bowel incontinence, diarrhea and excessive appetite.  Genitourinary: Negative for decreased libido, genital sores and incomplete emptying.  Neurological: Negative for brief paralysis, focal weakness, headaches and loss of balance.  Psychiatric/Behavioral: Negative for altered mental status, depression and suicidal ideas.  Allergic/Immunologic: Negative for HIV exposure and persistent infections.    EKGs/Labs/Other Studies Reviewed:    The following studies were reviewed today:   EKG:  The ekg ordered today demonstrates sinus rhythm, heart rate 75 bpm with occasional PVCs  TTE 01/25/2021 FINDINGS   Left Ventricle: Left ventricular ejection fraction, by estimation, is 50  to 55%. Left ventricular ejection fraction by 3D volume is 51 %. The left  ventricle has low normal function. The left ventricle has no regional wall  motion abnormalities. The  average left ventricular global longitudinal strain is  -20.8 %. The global  longitudinal strain is normal. The left ventricular internal cavity size  was normal in size. There is no left ventricular hypertrophy. Left  ventricular diastolic parameters are  consistent with Grade I diastolic dysfunction (impaired relaxation).  Indeterminate filling pressures.   Right Ventricle: The right ventricular size is normal. No increase in  right ventricular wall thickness. Right ventricular systolic function is  normal. Tricuspid regurgitation signal is inadequate for assessing PA  pressure.   Left Atrium: Left atrial size was normal in size.   Right Atrium: Right atrial size was normal in size.   Pericardium: There is no evidence of pericardial effusion.   Mitral Valve: The mitral valve is abnormal. There is mild thickening of  the mitral valve leaflet(s). Trivial mitral valve regurgitation.   Tricuspid Valve: The tricuspid valve is grossly normal. Tricuspid valve  regurgitation is trivial.   Aortic Valve: The aortic valve is tricuspid. Aortic valve regurgitation is  not visualized.   Pulmonic Valve: The pulmonic valve was grossly normal. Pulmonic valve  regurgitation is trivial.   Aorta: The aortic root and ascending aorta are structurally normal, with  no evidence of dilitation.   Venous: The inferior vena cava is normal in size with less than 50%  respiratory variability, suggesting right atrial pressure of 8 mmHg.   IAS/Shunts: No atrial level shunt detected by color flow Doppler.       CCTA 02/16/2021 Aorta: Normal size.  No calcifications.  No dissection.   Aortic Valve:  Trileaflet.  No calcifications.   Coronary Arteries:  Normal coronary origin.  Right dominance.   RCA is a large dominant artery that gives rise to PDA and PLA. There is no plaque.   Left main is a large artery that gives rise to LAD and LCX arteries.   LAD is a large vessel that has no plaque.   LCX is a non-dominant artery that gives rise to one large  OM1 branch. There is no plaque.   Coronary Calcium Score:   Left main: 0   Left anterior descending artery: 0   Left circumflex artery: 0   Right coronary artery: 0   Total: 0   Percentile: 0   Other findings:   Normal pulmonary vein drainage into the left atrium.   Normal left atrial appendage without a thrombus.   Normal size of the pulmonary artery.   Small left ventricular diverticulum.   IMPRESSION: 1. Coronary calcium score of 0. This was 0 percentile for age and sex matched control.   2. Normal coronary origin with right dominance.   3. No evidence of CAD. CAD-RADS 0. No evidence of CAD (0%). Consider non-atherosclerotic causes of chest pain.   4.  Small Left ventricular diverticulum.     Electronically Signed   By: Elizabella Nolet D.O.   On: 02/16/2021 16:59    Addended by Talbot Monarch, DO on 02/16/2021  5:02 PM    Study Result  Narrative & Impression  EXAM: OVER-READ INTERPRETATION  CT CHEST   The following report is an over-read performed by radiologist Dr. Rockey Kilts of [facility] on 02/16/2021. This over-read does not include interpretation of cardiac or coronary anatomy or pathology. The coronary CTA interpretation by the cardiologist is attached.   COMPARISON:  Chest and rib radiographs of 12/04/2016.   FINDINGS: Vascular: Normal aortic caliber. No central pulmonary embolism, on this non-dedicated study.   Mediastinum/Nodes: No imaged thoracic adenopathy.   Lungs/Pleura: No pleural fluid.  Clear imaged lungs.   Upper Abdomen: Normal imaged portions of the liver, spleen, stomach.   Musculoskeletal: No acute osseous abnormality.   IMPRESSION: No acute findings in the imaged extracardiac chest.   Electronically Signed: By: Rockey Kilts M.D. On: 02/16/2021 16:46    Zio monitor 01/26/2021 Patch Wear Time:  12 days and 7 hours (2022-11-25T20:29:08-498 to 2022-12-08T03:50:19-0500)   Patient had a min HR of 54 bpm, max  HR of 222 bpm, and avg HR of 82 bpm. Predominant underlying rhythm was Sinus Rhythm. 7 Ventricular Tachycardia runs occurred, the run with the fastest interval lasting 7 beats with a max rate of 218 bpm, the longest  lasting 11 beats with an avg rate of 120 bpm. 7 Supraventricular Tachycardia runs occurred, the run with the fastest interval lasting 13.3 secs with a max rate of 222 bpm (avg 169 bpm);  the run with the fastest interval was also the longest. Isolated  SVEs were rare (<1.0%), SVE Couplets were rare (<1.0%), and SVE Triplets were rare (<1.0%). Isolated VEs were rare (<1.0%, 5033), VE Couplets were rare (<1.0%, 16), and VE Triplets were rare (<1.0%, 1). Ventricular Bigeminy was present. Inverted QRS  complexes possibly due to inverted placement of device.     Conclusion: This study is markable for the following:                                  1.  Short run of nonsustained ventricular tachycardia                                  2.  Paroxysmal supraventricular tachycardia.   This was already discussed with the patient she has been started on low-dose beta-blocker. Recent Labs: 03/14/2022: Hemoglobin 12.2; Platelets 314; TSH 2.720 01/24/2023: ALT 10; BUN 13; Creatinine, Ser 0.94; Potassium 4.5; Sodium 140  Recent Lipid Panel    Component Value Date/Time   CHOL 174 03/14/2022 0820   TRIG 71 03/14/2022 0820   HDL 69 03/14/2022 0820   CHOLHDL 2.5 03/31/2020 0758   CHOLHDL 2.7 12/13/2015 1104   VLDL 11 12/13/2015 1104   LDLCALC 92 03/14/2022 0820    Physical Exam:    VS:  BP 120/80 (BP Location: Right Arm, Patient Position: Sitting, Cuff Size: Normal)   Pulse 72   Ht 5' 8 (1.727 m)   Wt 206 lb 3.2 oz (93.5 kg)   LMP 10/14/2005 (Approximate)   SpO2 94%   BMI 31.35 kg/m     Wt Readings from Last 3 Encounters:  02/23/23 206 lb 3.2 oz (93.5 kg)  02/13/23 208 lb (94.3 kg)  01/24/23 204 lb (92.5 kg)     GEN: Well nourished, well developed in no acute distress HEENT:  Normal NECK: No JVD; No carotid bruits LYMPHATICS: No lymphadenopathy CARDIAC: S1S2 noted,RRR, no murmurs, rubs, gallops RESPIRATORY:  Clear to auscultation without rales, wheezing or rhonchi  ABDOMEN: Soft, non-tender, non-distended, +bowel sounds, no guarding. EXTREMITIES: No edema, No cyanosis, no clubbing MUSCULOSKELETAL:  No deformity  SKIN: Warm and dry NEUROLOGIC:  Alert and oriented x 3, non-focal PSYCHIATRIC:  Normal affect, good insight  ASSESSMENT:    1. NSVT (nonsustained ventricular tachycardia) (HCC)   2. PSVT (paroxysmal supraventricular tachycardia) (HCC)   3. PVC (premature ventricular contraction)   4. Prediabetes   5. Mixed hyperlipidemia    PLAN:    She still is experiencing some palpitations, I think it will be beneficial to increase her metoprolol  to succinate to 25 mg daily.  Recent hemoglobin A1c 6.0.  She is on metformin .  Review her lipid profile.  Prefers diet modification  The patient is in agreement with the above plan. The patient left the office in stable condition.  The patient will follow up in a year or sooner if needed.   Medication Adjustments/Labs and Tests Ordered: Current medicines are reviewed at length with the patient today.  Concerns regarding medicines are outlined above.  Orders Placed This Encounter  Procedures   EKG 12-Lead   No orders of the defined types were placed in this encounter.   There are no Patient Instructions on file for this visit.   Adopting a Healthy Lifestyle.  Know what a healthy weight is for you (roughly BMI <25)  and aim to maintain this   Aim for 7+ servings of fruits and vegetables daily   65-80+ fluid ounces of water or unsweet tea for healthy kidneys   Limit to max 1 drink of alcohol per day; avoid smoking/tobacco   Limit animal fats in diet for cholesterol and heart health - choose grass fed whenever available   Avoid highly processed foods, and foods high in saturated/trans fats   Aim for  low stress - take time to unwind and care for your mental health   Aim for 150 min of moderate intensity exercise weekly for heart health, and weights twice weekly for bone health   Aim for 7-9 hours of sleep daily   When it comes to diets, agreement about the perfect plan isnt easy to find, even among the experts. Experts at the [facility] [employer] developed an idea known as the Healthy Eating Plate. Just imagine a plate divided into logical, healthy portions.   The emphasis is on diet quality:   Load up on vegetables and fruits - one-half of your plate: Aim for color and variety, and remember that potatoes dont count.   Go for whole grains - one-quarter of your plate: Whole wheat, barley, wheat berries, quinoa, oats, brown rice, and foods made with them. If you want pasta, go with whole wheat pasta.   Protein power - one-quarter of your plate: Fish, chicken, beans, and nuts are all healthy, versatile protein sources. Limit red meat.   The diet, however, does go beyond the plate, offering a few other suggestions.   Use healthy plant oils, such as olive, canola, soy, corn, sunflower and peanut. Check the labels, and avoid partially hydrogenated oil, which have unhealthy trans fats.   If youre thirsty, drink water. Coffee and tea are good in moderation, but skip sugary drinks and limit milk and dairy products to one or two daily servings.   The type of carbohydrate in the diet is more important than the amount. Some sources of carbohydrates, such as vegetables, fruits, whole grains, and beans-are healthier than others.   Finally, stay active  Signed, Dub Huntsman, DO  02/23/2023 8:46 AM    [facility]

## 2023-03-01 ENCOUNTER — Telehealth (INDEPENDENT_AMBULATORY_CARE_PROVIDER_SITE_OTHER): Payer: Self-pay | Admitting: Physician Assistant

## 2023-03-01 ENCOUNTER — Ambulatory Visit (INDEPENDENT_AMBULATORY_CARE_PROVIDER_SITE_OTHER): Payer: BC Managed Care – PPO | Admitting: Physician Assistant

## 2023-03-01 NOTE — Telephone Encounter (Signed)
Pt called stating that she needs a refill of Wegovy. Pt was scheduled to see Shawn Rayburn, but Ines Bloomer is out of the office due to illness. Pt would like a refill. Pt has an appt scheduled for 01/21, but is out of her medication.

## 2023-03-02 ENCOUNTER — Other Ambulatory Visit (INDEPENDENT_AMBULATORY_CARE_PROVIDER_SITE_OTHER): Payer: Self-pay | Admitting: Family Medicine

## 2023-03-02 DIAGNOSIS — Z6832 Body mass index (BMI) 32.0-32.9, adult: Secondary | ICD-10-CM

## 2023-03-05 ENCOUNTER — Other Ambulatory Visit (INDEPENDENT_AMBULATORY_CARE_PROVIDER_SITE_OTHER): Payer: Self-pay | Admitting: Physician Assistant

## 2023-03-05 DIAGNOSIS — Z6832 Body mass index (BMI) 32.0-32.9, adult: Secondary | ICD-10-CM

## 2023-03-05 MED ORDER — WEGOVY 1.7 MG/0.75ML ~~LOC~~ SOAJ: 1.7000 mg | SUBCUTANEOUS | 0 refills | Status: DC

## 2023-03-06 ENCOUNTER — Ambulatory Visit (INDEPENDENT_AMBULATORY_CARE_PROVIDER_SITE_OTHER): Payer: BC Managed Care – PPO | Admitting: Family Medicine

## 2023-03-06 ENCOUNTER — Encounter (INDEPENDENT_AMBULATORY_CARE_PROVIDER_SITE_OTHER): Payer: Self-pay | Admitting: Family Medicine

## 2023-03-06 VITALS — BP 105/72 | HR 94 | Temp 98.5°F | Ht 68.0 in | Wt 204.0 lb

## 2023-03-06 DIAGNOSIS — Z6831 Body mass index (BMI) 31.0-31.9, adult: Secondary | ICD-10-CM | POA: Diagnosis not present

## 2023-03-06 DIAGNOSIS — E669 Obesity, unspecified: Secondary | ICD-10-CM

## 2023-03-06 DIAGNOSIS — R7303 Prediabetes: Secondary | ICD-10-CM

## 2023-03-06 DIAGNOSIS — E559 Vitamin D deficiency, unspecified: Secondary | ICD-10-CM | POA: Diagnosis not present

## 2023-03-06 MED ORDER — METFORMIN HCL 500 MG PO TABS: ORAL_TABLET | ORAL | 0 refills | Status: DC

## 2023-03-06 NOTE — Progress Notes (Signed)
Crystal Simmons, D.O.  ABFM, ABOM Specializing in Clinical Bariatric Medicine  Office located at: [address] [place], [place]  [address]   Assessment and Plan:   FOR THE DISEASE OF OBESITY: Obesity, Beginning BMI 32.08 BMI 31.0-31.9,adult - Current BMI 31.03 Assessment & Plan: This is my first visit with Crystal Simmons. Since last office visit with Dr. Lawson Radar on 01/24/23 patient's muscle mass has decreased by 0.8lb. Fat mass has increased by 0.4lb. Total body water has decreased by 0.4lb.  Counseling done on how various foods will affect these numbers and how to maximize success  Total lbs lost to date: 4 Total weight loss percentage to date: -3.32%    Recommended Dietary Goals Crystal Simmons is currently in the action stage of change. As such, her goal is to continue weight management plan.  She has agreed to: continue current plan   Behavioral Intervention We discussed the following today: increasing lean protein intake to established goals, decreasing simple carbohydrates , increasing water intake , avoiding temptations and identifying enticing environmental cues, continue to practice mindfulness when eating, and continue to work on maintaining a reduced calorie state, getting the recommended amount of protein, incorporating whole foods, making healthy choices, staying well hydrated and practicing mindfulness when eating.  Additional resources provided today: None  Evidence-based interventions for health behavior change were utilized today including the discussion of self monitoring techniques, problem-solving barriers and SMART goal setting techniques.   Regarding patient's less desirable eating habits and patterns, we employed the technique of small changes.   Pt will specifically work on: Increase the number of days of weight training each week  for next visit.    Recommended Physical Activity Goals Crystal Simmons has been advised to work up to 150 minutes of moderate intensity  aerobic activity a week and strengthening exercises 2-3 times per week for cardiovascular health, weight loss maintenance and preservation of muscle mass.   She has agreed to :  Increase physical activity in their day and reduce sedentary time (increase NEAT). and Increase the intensity, frequency or duration of strengthening exercises    Pharmacotherapy We discussed various medication options to help Crystal Simmons with her weight loss efforts and we both agreed to:  continue with nutritional and behavioral strategies and adequate clinical response to current dose, continue current regimen   FOR ASSOCIATED CONDITIONS ADDRESSED TODAY: Pre-diabetes Assessment & Plan: Lab Results  Component Value Date   HGBA1C 6.0 (H) 01/24/2023   HGBA1C 6.0 (H) 09/28/2022   HGBA1C 5.9 (H) 03/14/2022   INSULIN 14.4 01/24/2023   INSULIN 10.6 09/28/2022   INSULIN 6.0 03/14/2022    Pt A1C stable at 6.0. She has been without Wegovy for about 3 weeks, last dose was on 12/23 prior to picking up her rx yesterday. Was previously compliant with Wegovy 1.7 mg for 4-5 months prior. Pt hunger and cravings were improved while on Wegovy. She is also on Metformin 500 mg once daily. She endorses leg cramps, which she attributes to her not drinking sufficient amounts of water daily.   Reviewed goal A1C and fasting insulin levels. Discussed A1C being a 3 month average of sugars, will recheck her A1C in the next 2-3 months. Reviewed Kidney and liver enzymes labs. Encouraged pt to decrease simple carbs/sugars and meet her protein goal per her meal plan. Discussed how sufficient protein intake will help maintain muscle, improve her metabolism, and help with weight loss. Educated pt on importance of drinking water and properly hydrating by drinking  at least 100 ounces daily, especially when exercising. Continue with Metformin and resume Wegovy.   Orders: -Refill Metformin, no dose changes.    Vitamin D deficiency Assessment &  Plan: Lab Results  Component Value Date   VD25OH 61.9 01/24/2023   VD25OH 67.4 09/28/2022   VD25OH 52.3 03/14/2022   Last vitamin D levels were at goal at 61.9 on 01/24/23. Pt is taking Cholecalciferol 2000 units daily. Tolerating well, no reported SE. Reviewed how vitamin D is a lipophilic vitamin. Benefits of at goal vitamin D levels such as good for bone health/strength reviewed. Continue with ERGO, no changes made today.    Follow up:   Return in about 4 weeks (around 04/03/2023). She was informed of the importance of frequent follow up visits to maximize her success with intensive lifestyle modifications for her multiple health conditions.  Subjective:   Chief complaint: Obesity Crystal Simmons is here to discuss her progress with her obesity treatment plan. She is on the keeping a food journal and adhering to recommended goals of 1200 calories and 85+ g of protein and states she is following her eating plan approximately 80% of the time. She states she is doing cardio/weights 30 minutes 3-4 days per week. Also walking 4 days a week using a [employer].   Interval History:  Crystal Simmons is here for a follow up office visit. Since last OV, she reports some off-plan eating over the holidays but continued to walk daily. Eating protein with her salads. She is hitting her journaling goals about 80% of the time but struggles to meet her protein goals daily.   Barriers identified: exposure to enticing environments and/or relationships and holiday eating .   Pharmacotherapy for weight loss: She is currently taking Metformin (off label use for incretin effect and / or insulin resistance and / or diabetes prevention) with adequate clinical response  and without side effects. and [id number] with adequate clinical response  and without side effects..   Review of Systems:  Pertinent positives were addressed with patient today.  Reviewed by clinician on day of visit: allergies, medications, problem list,  medical history, surgical history, family history, social history, and previous encounter notes.  Weight Summary and Biometrics   Weight Lost Since Last Visit: 0lb  Weight Gained Since Last Visit: 0lb   Vitals Temp: 98.5 F (36.9 C) BP: 105/72 Pulse Rate: 94 SpO2: 99 %   Anthropometric Measurements Height: 5\' 8"  (1.727 m) Weight: 204 lb (92.5 kg) BMI (Calculated): 31.03 Weight at Last Visit: 204lb Weight Lost Since Last Visit: 0lb Weight Gained Since Last Visit: 0lb Starting Weight: 211lb Total Weight Loss (lbs): 7 lb (3.175 kg)   Body Composition  Body Fat %: 40.3 % Fat Mass (lbs): 82.2 lbs Muscle Mass (lbs): 115.6 lbs Total Body Water (lbs): 84 lbs Visceral Fat Rating : 11   Other Clinical Data Fasting: Yes Labs: No Today's Visit #: 33 Starting Date: 05/01/19    Objective:   PHYSICAL EXAM: Blood pressure 105/72, pulse 94, temperature 98.5 F (36.9 C), height 5\' 8"  (1.727 m), weight 204 lb (92.5 kg), last menstrual period 10/14/2005, SpO2 99%. Body mass index is 31.02 kg/m.  General: she is overweight, cooperative and in no acute distress. PSYCH: Has normal mood, affect and thought process.   HEENT: EOMI, sclerae are anicteric. Lungs: Normal breathing effort, no conversational dyspnea. Extremities: Moves * 4 Neurologic: A and O * 3, good insight  DIAGNOSTIC DATA REVIEWED: BMET    Component Value Date/Time  NA 140 01/24/2023 0813   K 4.5 01/24/2023 0813   CL 103 01/24/2023 0813   CO2 24 01/24/2023 0813   GLUCOSE 92 01/24/2023 0813   GLUCOSE 88 12/13/2015 1104   BUN 13 01/24/2023 0813   CREATININE 0.94 01/24/2023 0813   CREATININE 0.88 12/13/2015 1104   CALCIUM 9.6 01/24/2023 0813   GFRNONAA 68 03/31/2020 0758   GFRAA 79 03/31/2020 0758   Lab Results  Component Value Date   HGBA1C 6.0 (H) 01/24/2023   HGBA1C 5.4 12/13/2015   Lab Results  Component Value Date   INSULIN 14.4 01/24/2023   INSULIN 8.9 05/01/2019   Lab Results   Component Value Date   TSH 2.720 03/14/2022   CBC    Component Value Date/Time   WBC 7.3 03/14/2022 0820   WBC 7.4 12/13/2015 1104   RBC 3.97 03/14/2022 0820   RBC 3.84 12/13/2015 1104   HGB 12.2 03/14/2022 0820   HGB 11.3 (L) 12/13/2015 1347   HCT 38.3 03/14/2022 0820   PLT 314 03/14/2022 0820   MCV 97 03/14/2022 0820   MCH 30.7 03/14/2022 0820   MCH 30.7 12/13/2015 1104   MCHC 31.9 03/14/2022 0820   MCHC 32.4 12/13/2015 1104   RDW 12.9 03/14/2022 0820   Iron Studies    Component Value Date/Time   IRON 93 03/31/2020 0758   TIBC 264 03/31/2020 0758   FERRITIN 65 03/31/2020 0758   IRONPCTSAT 35 03/31/2020 0758   Lipid Panel     Component Value Date/Time   CHOL 174 03/14/2022 0820   TRIG 71 03/14/2022 0820   HDL 69 03/14/2022 0820   CHOLHDL 2.5 03/31/2020 0758   CHOLHDL 2.7 12/13/2015 1104   VLDL 11 12/13/2015 1104   LDLCALC 92 03/14/2022 0820   Hepatic Function Panel     Component Value Date/Time   PROT 7.2 01/24/2023 0813   ALBUMIN 4.1 01/24/2023 0813   AST 14 01/24/2023 0813   ALT 10 01/24/2023 0813   ALKPHOS 104 01/24/2023 0813   BILITOT 0.6 01/24/2023 0813      Component Value Date/Time   TSH 2.720 03/14/2022 0820   Nutritional Lab Results  Component Value Date   VD25OH 61.9 01/24/2023   VD25OH 67.4 09/28/2022   VD25OH 52.3 03/14/2022    Attestations:   I, Isabelle Course, acting as a Stage manager for Thomasene Lot, DO., have compiled all relevant documentation for today's office visit on behalf of Thomasene Lot, DO, while in the presence of [employer], DO.  Reviewed by clinician on day of visit: allergies, medications, problem list, medical history, surgical history, family history, social history, and previous encounter notes pertinent to patient's obesity diagnosis.  I have reviewed the above documentation for accuracy and completeness, and I agree with the above. Crystal Simmons, D.O.  The 21st Century Cures Act was signed into  law in 2016 which includes the topic of electronic health records.  This provides immediate access to information in MyChart.  This includes consultation notes, operative notes, office notes, lab results and pathology reports.  If you have any questions about what you read please let us know at your next visit so we can discuss your concerns and take corrective action if need be.  We are right here with you.

## 2023-04-09 ENCOUNTER — Encounter (INDEPENDENT_AMBULATORY_CARE_PROVIDER_SITE_OTHER): Payer: Self-pay | Admitting: Family Medicine

## 2023-04-09 ENCOUNTER — Ambulatory Visit (INDEPENDENT_AMBULATORY_CARE_PROVIDER_SITE_OTHER): Payer: BC Managed Care – PPO | Admitting: Family Medicine

## 2023-04-09 VITALS — BP 121/83 | HR 80 | Temp 98.2°F | Ht 68.0 in | Wt 203.0 lb

## 2023-04-09 DIAGNOSIS — R7303 Prediabetes: Secondary | ICD-10-CM

## 2023-04-09 DIAGNOSIS — E669 Obesity, unspecified: Secondary | ICD-10-CM

## 2023-04-09 DIAGNOSIS — Z6831 Body mass index (BMI) 31.0-31.9, adult: Secondary | ICD-10-CM

## 2023-04-09 MED ORDER — WEGOVY 2.4 MG/0.75ML ~~LOC~~ SOAJ: 2.4000 mg | SUBCUTANEOUS | 0 refills | Status: DC

## 2023-04-09 NOTE — Progress Notes (Signed)
 SUBJECTIVE:  Chief Complaint: Obesity  Interim History: Patient is focusing on her water intake, protein intake and feels like the weight is just not coming off. She is able to get all the food in consistently.  She was hoping that with the wegovy she would see faster scale change.  She is taking miralax 3 days a week.  She is still feeling like she is having appropriate bowel movements.  She is experiencing hunger and cravings.   Crystal Simmons is here to discuss her progress with her obesity treatment plan. She is on the keeping a food journal and adhering to recommended goals of 1200 calories and 85 grams of protein and states she is following her eating plan approximately 80 % of the time. She states she is exercising 30 minutes 4 times per week.   OBJECTIVE: Visit Diagnoses: Problem List Items Addressed This Visit       Other   Prediabetes   Last A1c mid prediabetic range.  She is on Wegovy for weight management.  Will need repeat lab in April to assess control of her blood sugars.      Generalized obesity - Primary   Relevant Medications   Semaglutide-Weight Management (WEGOVY) 2.4 MG/0.75ML SOAJ   BMI 31.0-31.9,adult Current BMI 31.0   Other Clinical Data Fasting: yes Labs: no Today's Visit #: 34 Starting Date: 05/01/19 Anthropometric Measurements Height: 5\' 8"  (1.727 m) Weight: 203 lb (92.1 kg) BMI (Calculated): 30.87 Weight at Last Visit: 204 lb Weight Lost Since Last Visit: 1 Weight Gained Since Last Visit: 0 Starting Weight: 211 lb Total Weight Loss (lbs): 8 lb (3.629 kg)  Body Composition  Body Fat %: 39.3 % Fat Mass (lbs): 79.8 lbs Muscle Mass (lbs): 117 lbs Total Body Water (lbs): 86.6 lbs Visceral Fat Rating : 10  Doing well on wegovy and feels ready to move up to the 2.4mg  due to increased hunger and cravings.  Will send new Rx in.       No data recorded     04/09/2023    8:00 AM 03/06/2023    9:00 AM 02/23/2023    8:27 AM  Vitals with BMI  Height 5'  8" 5\' 8"  5\' 8"   Weight 203 lbs 204 lbs 206 lbs 3 oz  BMI 30.87 31.03 31.36  Systolic 121 105 829  Diastolic 83 72 80  Pulse 80 94 72     ASSESSMENT AND PLAN:  Diet: Crystal Simmons is currently in the action stage of change. As such, her goal is to continue with weight loss efforts. She has agreed to keeping a food journal and adhering to recommended goals of 1200 calories and 85 or more grams protein daily.  Exercise: [name] has been instructed to continue exercising as is for weight loss and overall health benefits.   Behavior Modification:  We discussed the following Behavioral Modification Strategies today: increasing lean protein intake, increasing vegetables, decreasing sodium intake, meal planning and cooking strategies, and better snacking choices. We discussed various medication options to help Crystal Simmons with her weight loss efforts and we both agreed to increase Wegovy to 2.4mg  weekly.  Return in about 4 weeks (around 05/07/2023) for fasting labs and IC.Marland Kitchen She was informed of the importance of frequent follow up visits to maximize her success with intensive lifestyle modifications for her multiple health conditions.  Attestation Statements:   Reviewed by clinician on day of visit: allergies, medications, problem list, medical history, surgical history, family history, social history, and previous encounter notes.  Reuben Likes, MD

## 2023-04-16 NOTE — Assessment & Plan Note (Signed)
 Other Clinical Data Fasting: yes Labs: no Today's Visit #: 34 Starting Date: 05/01/19 Anthropometric Measurements Height: 5\' 8"  (1.727 m) Weight: 203 lb (92.1 kg) BMI (Calculated): 30.87 Weight at Last Visit: 204 lb Weight Lost Since Last Visit: 1 Weight Gained Since Last Visit: 0 Starting Weight: 211 lb Total Weight Loss (lbs): 8 lb (3.629 kg)  Body Composition  Body Fat %: 39.3 % Fat Mass (lbs): 79.8 lbs Muscle Mass (lbs): 117 lbs Total Body Water (lbs): 86.6 lbs Visceral Fat Rating : 10  Doing well on wegovy and feels ready to move up to the 2.4mg  due to increased hunger and cravings.  Will send new Rx in.

## 2023-04-16 NOTE — Assessment & Plan Note (Signed)
 Last A1c mid prediabetic range.  She is on Wegovy for weight management.  Will need repeat lab in April to assess control of her blood sugars.

## 2023-05-07 ENCOUNTER — Ambulatory Visit (INDEPENDENT_AMBULATORY_CARE_PROVIDER_SITE_OTHER): Payer: BC Managed Care – PPO | Admitting: Family Medicine

## 2023-05-07 ENCOUNTER — Encounter (INDEPENDENT_AMBULATORY_CARE_PROVIDER_SITE_OTHER): Payer: Self-pay | Admitting: Family Medicine

## 2023-05-07 VITALS — BP 117/78 | HR 89 | Temp 98.2°F | Ht 68.0 in | Wt 199.0 lb

## 2023-05-07 DIAGNOSIS — E66811 Obesity, class 1: Secondary | ICD-10-CM | POA: Diagnosis not present

## 2023-05-07 DIAGNOSIS — R7303 Prediabetes: Secondary | ICD-10-CM

## 2023-05-07 DIAGNOSIS — Z6830 Body mass index (BMI) 30.0-30.9, adult: Secondary | ICD-10-CM | POA: Diagnosis not present

## 2023-05-07 DIAGNOSIS — E669 Obesity, unspecified: Secondary | ICD-10-CM

## 2023-05-07 MED ORDER — WEGOVY 2.4 MG/0.75ML ~~LOC~~ SOAJ: 2.4000 mg | SUBCUTANEOUS | 1 refills | Status: DC

## 2023-05-07 MED ORDER — WEGOVY 2.4 MG/0.75ML ~~LOC~~ SOAJ: 2.4000 mg | SUBCUTANEOUS | 0 refills | Status: DC

## 2023-05-07 NOTE — Progress Notes (Unsigned)
   SUBJECTIVE:  Chief Complaint: Obesity  Interim History: Patient has been working consistently on her resistance training and is getting that in at least 2x a week.  Otherwise she is doing the treadmill or walking outside when the weather is nice.  She is also doing wall push ups and is using the resistance bands for stretches.  She is averaging about 80g of protein daily but wants to work toward getting more in.  Crystal Simmons is here to discuss her progress with her obesity treatment plan. She is on the keeping a food journal and adhering to recommended goals of 1200 calories and 85 grams of protein and states she is following her eating plan approximately 75 % of the time. She states she is exercising 30-35 minutes 5 times per week.   OBJECTIVE: Visit Diagnoses: Problem List Items Addressed This Visit   None   Vitals Temp: 98.2 F (36.8 C) BP: 117/78 Pulse Rate: 89 SpO2: 99 %   Anthropometric Measurements Height: 5\' 8"  (1.727 m) Weight: 199 lb (90.3 kg) BMI (Calculated): 30.26 Weight at Last Visit: 203 lb Weight Lost Since Last Visit: 4 Weight Gained Since Last Visit: 0 Starting Weight: 211 lb Total Weight Loss (lbs): 12 lb (5.443 kg)   Body Composition  Body Fat %: 36.7 % Fat Mass (lbs): 73.2 lbs Muscle Mass (lbs): 119.6 lbs Total Body Water (lbs): 84 lbs Visceral Fat Rating : 10   Other Clinical Data Fasting: yes Labs: yes Today's Visit #: 35 Starting Date: 05/01/19     ASSESSMENT AND PLAN:  Diet: Niralya is currently in the action stage of change. As such, her goal is to continue with weight loss efforts and has agreed to keeping a food journal and adhering to recommended goals of 1200 calories and 90 or more grams protein daily.  Her goal in the next few weeks is to focus on increasing total protein and aiming for 100g daily.   Exercise:  Adults should also include muscle-strengthening activities that involve all major muscle groups on 2 or more days a  week. Wants to contine with her walking and cardio.  Behavior Modification:  We discussed the following Behavioral Modification Strategies today: increasing lean protein intake, increasing vegetables, meal planning and cooking strategies, planning for success, and keep a strict food journal.   No follow-ups on file.Marland Kitchen She was informed of the importance of frequent follow up visits to maximize her success with intensive lifestyle modifications for her multiple health conditions.  Attestation Statements:   Reviewed by clinician on day of visit: allergies, medications, problem list, medical history, surgical history, family history, social history, and previous encounter notes.   Reuben Likes, MD

## 2023-05-08 LAB — COMPREHENSIVE METABOLIC PANEL
ALT: 12 IU/L (ref 0–32)
AST: 18 IU/L (ref 0–40)
Albumin: 4 g/dL (ref 3.9–4.9)
Alkaline Phosphatase: 116 IU/L (ref 44–121)
BUN/Creatinine Ratio: 13 (ref 12–28)
BUN: 11 mg/dL (ref 8–27)
Bilirubin Total: 0.5 mg/dL (ref 0.0–1.2)
CO2: 23 mmol/L (ref 20–29)
Calcium: 9.6 mg/dL (ref 8.7–10.3)
Chloride: 104 mmol/L (ref 96–106)
Creatinine, Ser: 0.87 mg/dL (ref 0.57–1.00)
Globulin, Total: 3.2 g/dL (ref 1.5–4.5)
Glucose: 81 mg/dL (ref 70–99)
Potassium: 4.5 mmol/L (ref 3.5–5.2)
Sodium: 141 mmol/L (ref 134–144)
Total Protein: 7.2 g/dL (ref 6.0–8.5)
eGFR: 75 mL/min/{1.73_m2} (ref >59)

## 2023-05-08 LAB — INSULIN, RANDOM: INSULIN: 10.2 u[IU]/mL (ref 2.6–24.9)

## 2023-05-08 LAB — HEMOGLOBIN A1C
Est. average glucose Bld gHb Est-mCnc: 120 mg/dL
Hgb A1c MFr Bld: 5.8 % — ABNORMAL HIGH (ref 4.8–5.6)

## 2023-05-10 DIAGNOSIS — Z6832 Body mass index (BMI) 32.0-32.9, adult: Secondary | ICD-10-CM | POA: Insufficient documentation

## 2023-05-10 NOTE — Assessment & Plan Note (Signed)
 Anthropometric Measurements Height: 5\' 8"  (1.727 m) Weight: 199 lb (90.3 kg) BMI (Calculated): 30.26 Weight at Last Visit: 203 lb Weight Lost Since Last Visit: 4 Weight Gained Since Last Visit: 0 Starting Weight: 211 lb Total Weight Loss (lbs): 12 lb (5.443 kg) Body Composition  Body Fat %: 36.7 % Fat Mass (lbs): 73.2 lbs Muscle Mass (lbs): 119.6 lbs Total Body Water (lbs): 84 lbs Visceral Fat Rating : 10 Other Clinical Data Fasting: yes Labs: yes Today's Visit #: 35 Starting Date: 05/01/19

## 2023-05-10 NOTE — Assessment & Plan Note (Signed)
 Fasting today for repeat labs- CMP, A1c and Insulin.  Will plan to discuss labs at next appointment.

## 2023-05-10 NOTE — Assessment & Plan Note (Signed)
 Doing well on wegovy weekly.  Needs a refill of that today.  She is working on consistency of protein intake and controlling her total calorie intake.

## 2023-05-14 ENCOUNTER — Telehealth (INDEPENDENT_AMBULATORY_CARE_PROVIDER_SITE_OTHER): Payer: Self-pay

## 2023-05-14 NOTE — Telephone Encounter (Signed)
 Your request has been approved- Wegovy 2.4mg   CaseId:[id number];Status:Approved;Review Type:Prior Auth;Coverage Start Date:04/10/2023;Coverage End Date:05/09/2024;

## 2023-06-11 ENCOUNTER — Encounter (INDEPENDENT_AMBULATORY_CARE_PROVIDER_SITE_OTHER): Payer: Self-pay | Admitting: Family Medicine

## 2023-06-11 ENCOUNTER — Other Ambulatory Visit (INDEPENDENT_AMBULATORY_CARE_PROVIDER_SITE_OTHER): Payer: Self-pay | Admitting: Family Medicine

## 2023-06-11 ENCOUNTER — Ambulatory Visit (INDEPENDENT_AMBULATORY_CARE_PROVIDER_SITE_OTHER): Admitting: Family Medicine

## 2023-06-11 VITALS — BP 112/78 | HR 83 | Temp 98.8°F | Ht 68.0 in | Wt 198.0 lb

## 2023-06-11 DIAGNOSIS — Z6830 Body mass index (BMI) 30.0-30.9, adult: Secondary | ICD-10-CM

## 2023-06-11 DIAGNOSIS — E669 Obesity, unspecified: Secondary | ICD-10-CM

## 2023-06-11 DIAGNOSIS — R632 Polyphagia: Secondary | ICD-10-CM

## 2023-06-11 DIAGNOSIS — R7303 Prediabetes: Secondary | ICD-10-CM

## 2023-06-11 DIAGNOSIS — E66811 Obesity, class 1: Secondary | ICD-10-CM

## 2023-06-11 MED ORDER — LOMAIRA 8 MG PO TABS: ORAL_TABLET | ORAL | 0 refills | Status: DC

## 2023-06-11 MED ORDER — TOPIRAMATE 25 MG PO TABS: ORAL_TABLET | ORAL | 0 refills | Status: DC

## 2023-06-11 NOTE — Assessment & Plan Note (Signed)
 Patient doing very well on metformin .  A1c from last appointment down to 5.8.  She needs a refill of metformin  today.  No GI side effects reported.

## 2023-06-11 NOTE — Assessment & Plan Note (Addendum)
 Medication options were discussed extensively with patient today.  Given availability, cost, insurance coverage and other medical comorbidities the decision was reached to start medications Lomaira  and Topiramate.  These medications will be used as a substitute for brand name Qsymia in doses that are synanomous.  Patient understands this is an off label usage.  We discussed the titration schedule with the goal of 5% weight loss at 3 months at a treatment dose.  The first two weeks will be a starting dose of 25mg  of Topiramate and 4mg  of Lomaira .  After two weeks the patient will increase to 50mg  of Topiramate and 8mg  of Lomaira  and will stay on this dose until the next appointment.  Controlled substance contract was discussed and signed today.  Prescriptions sent in.  Starting weight of 198; to achieve 5% loss patient needs to lose 9.5lbs by mid August 2025.

## 2023-06-11 NOTE — Progress Notes (Signed)
 SUBJECTIVE:  Chief Complaint: Obesity  Interim History: Patient is wondering if she needs to do something different.  Doesn't feel the Wegovy  is doing anything for her. She has been working on logging consistently.  She is getting around 100g of protein daily and focusing on ensuring she is hitting calories.  She was off a bit on her birthday and indulged in red velvet cake.  She feels she can continue on with her weight training for the next 4 weeks.  Crystal Simmons is here to discuss her progress with her obesity treatment plan. She is on the keeping a food journal and adhering to recommended goals of 1200-1300 calories and 90 grams of protein and states she is following her eating plan approximately 90 % of the time. She states she is exercising 30 minutes 6 times per week.   OBJECTIVE: Visit Diagnoses: Problem List Items Addressed This Visit       Other   Prediabetes   Patient doing very well on metformin .  A1c from last appointment down to 5.8.  She needs a refill of metformin  today.  No GI side effects reported.      Polyphagia - Primary   Medication options were discussed extensively with patient today.  Given availability, cost, insurance coverage and other medical comorbidities the decision was reached to start medications Lomaira  and Topiramate.  These medications will be used as a substitute for brand name Qsymia in doses that are synanomous.  Patient understands this is an off label usage.  We discussed the titration schedule with the goal of 5% weight loss at 3 months at a treatment dose.  The first two weeks will be a starting dose of 25mg  of Topiramate and 4mg  of Lomaira .  After two weeks the patient will increase to 50mg  of Topiramate and 8mg  of Lomaira  and will stay on this dose until the next appointment.  Controlled substance contract was discussed and signed today.  Prescriptions sent in.  Starting weight of 198; to achieve 5% loss patient needs to lose 9.5lbs by mid August  2025.       Relevant Medications   Phentermine  HCl (LOMAIRA ) 8 MG TABS   topiramate (TOPAMAX) 25 MG tablet    Vitals Temp: 98.8 F (37.1 C) BP: 112/78 Pulse Rate: 83 SpO2: 97 %   Anthropometric Measurements Height: 5\' 8"  (1.727 m) Weight: 198 lb (89.8 kg) BMI (Calculated): 30.11 Weight at Last Visit: 199 lb Weight Lost Since Last Visit: 1 Weight Gained Since Last Visit: 0 Starting Weight: 211 lb Total Weight Loss (lbs): 13 lb (5.897 kg)   Body Composition  Body Fat %: 37 % Fat Mass (lbs): 73.4 lbs Muscle Mass (lbs): 118.4 lbs Total Body Water (lbs): 83.6 lbs Visceral Fat Rating : 10   Other Clinical Data Today's Visit #: 55 Starting Date: 05/01/19     ASSESSMENT AND PLAN:  Diet: Crystal Simmons is currently in the action stage of change. As such, her goal is to continue with weight loss efforts and has agreed to keeping a food journal and adhering to recommended goals of 1200 calories and 90 or more grams protein daily.   Exercise:  For substantial health benefits, adults should do at least 150 minutes (2 hours and 30 minutes) a week of moderate-intensity, or 75 minutes (1 hour and 15 minutes) a week of vigorous-intensity aerobic physical activity, or an equivalent combination of moderate- and vigorous-intensity aerobic activity. Aerobic activity should be performed in episodes of at least 10 minutes, and preferably,  it should be spread throughout the week.  Behavior Modification:  We discussed the following Behavioral Modification Strategies today: increasing lean protein intake, decreasing simple carbohydrates, meal planning and cooking strategies, keeping healthy foods in the home, and keep a strict food journal. We discussed various medication options to help Crystal Simmons with her weight loss efforts and we both agreed to switch from Wegovy  to makeshift qsymia.  Return in about 4 weeks (around 07/09/2023).Aaron Aas She was informed of the importance of frequent follow up visits to  maximize her success with intensive lifestyle modifications for her multiple health conditions.  Attestation Statements:   Reviewed by clinician on day of visit: allergies, medications, problem list, medical history, surgical history, family history, social history, and previous encounter notes.     Donaciano Frizzle, MD

## 2023-06-25 ENCOUNTER — Other Ambulatory Visit: Payer: Self-pay | Admitting: Family Medicine

## 2023-06-25 DIAGNOSIS — Z1231 Encounter for screening mammogram for malignant neoplasm of breast: Secondary | ICD-10-CM

## 2023-06-27 ENCOUNTER — Ambulatory Visit
Admission: RE | Admit: 2023-06-27 | Discharge: 2023-06-27 | Disposition: A | Discharge status: Home / Self Care | Source: Ambulatory Visit | Attending: Family Medicine | Admitting: Family Medicine

## 2023-06-27 DIAGNOSIS — Z1231 Encounter for screening mammogram for malignant neoplasm of breast: Secondary | ICD-10-CM

## 2023-07-10 ENCOUNTER — Telehealth (INDEPENDENT_AMBULATORY_CARE_PROVIDER_SITE_OTHER): Payer: Self-pay | Admitting: Family Medicine

## 2023-07-10 NOTE — Telephone Encounter (Signed)
 Good morning!  Patient needs a refill of metformin  and was wondering if she needs to wait until her appt on 06/04 to get that. She would like to be notified through [employer].   Thanks!

## 2023-07-12 ENCOUNTER — Other Ambulatory Visit (INDEPENDENT_AMBULATORY_CARE_PROVIDER_SITE_OTHER): Payer: Self-pay | Admitting: Family Medicine

## 2023-07-12 DIAGNOSIS — R632 Polyphagia: Secondary | ICD-10-CM

## 2023-07-18 ENCOUNTER — Encounter (INDEPENDENT_AMBULATORY_CARE_PROVIDER_SITE_OTHER): Payer: Self-pay

## 2023-07-18 ENCOUNTER — Other Ambulatory Visit (INDEPENDENT_AMBULATORY_CARE_PROVIDER_SITE_OTHER): Payer: Self-pay | Admitting: Family Medicine

## 2023-07-18 ENCOUNTER — Ambulatory Visit (INDEPENDENT_AMBULATORY_CARE_PROVIDER_SITE_OTHER): Admitting: Family Medicine

## 2023-07-18 DIAGNOSIS — R7303 Prediabetes: Secondary | ICD-10-CM

## 2023-07-18 DIAGNOSIS — E669 Obesity, unspecified: Secondary | ICD-10-CM

## 2023-07-18 MED ORDER — METFORMIN HCL 500 MG PO TABS: ORAL_TABLET | ORAL | 0 refills | Status: DC

## 2023-07-24 ENCOUNTER — Encounter (INDEPENDENT_AMBULATORY_CARE_PROVIDER_SITE_OTHER): Payer: Self-pay | Admitting: Family Medicine

## 2023-07-24 ENCOUNTER — Ambulatory Visit (INDEPENDENT_AMBULATORY_CARE_PROVIDER_SITE_OTHER): Admitting: Family Medicine

## 2023-07-24 ENCOUNTER — Other Ambulatory Visit (INDEPENDENT_AMBULATORY_CARE_PROVIDER_SITE_OTHER): Payer: Self-pay | Admitting: Family Medicine

## 2023-07-24 VITALS — BP 119/81 | HR 67 | Temp 98.5°F | Ht 68.0 in | Wt 199.0 lb

## 2023-07-24 DIAGNOSIS — R632 Polyphagia: Secondary | ICD-10-CM

## 2023-07-24 DIAGNOSIS — Z6830 Body mass index (BMI) 30.0-30.9, adult: Secondary | ICD-10-CM | POA: Diagnosis not present

## 2023-07-24 DIAGNOSIS — E669 Obesity, unspecified: Secondary | ICD-10-CM

## 2023-07-24 DIAGNOSIS — R7303 Prediabetes: Secondary | ICD-10-CM

## 2023-07-24 DIAGNOSIS — Z6831 Body mass index (BMI) 31.0-31.9, adult: Secondary | ICD-10-CM

## 2023-07-24 DIAGNOSIS — E66811 Obesity, class 1: Secondary | ICD-10-CM

## 2023-07-24 MED ORDER — TOPIRAMATE 100 MG PO TABS: 100.0000 mg | ORAL_TABLET | Freq: Every day | ORAL | 0 refills | Status: AC

## 2023-07-24 MED ORDER — LOMAIRA 8 MG PO TABS: ORAL_TABLET | ORAL | 0 refills | Status: DC

## 2023-07-24 MED ORDER — METFORMIN HCL 500 MG PO TABS: ORAL_TABLET | ORAL | 0 refills | Status: AC

## 2023-07-24 NOTE — Progress Notes (Signed)
 Crystal Simmons, D.O.  ABFM, ABOM Specializing in Clinical Bariatric Medicine  Office located at: [address] [place], [place]  [address]   Assessment and Plan:   Medications Discontinued During This Encounter  Medication Reason   topiramate  (TOPAMAX ) 25 MG tablet    Phentermine  HCl (LOMAIRA ) 8 MG TABS    metFORMIN  (GLUCOPHAGE ) 500 MG tablet Reorder   Semaglutide -Weight Management (WEGOVY ) 2.4 MG/0.75ML SOAJ Change in therapy     Meds ordered this encounter  Medications   metFORMIN  (GLUCOPHAGE ) 500 MG tablet    Sig: TAKE 1 TABLET BY MOUTH EVERY DAY WITH LUNCH    Dispense:  30 tablet    Refill:  0   topiramate  (TOPAMAX ) 100 MG tablet    Sig: Take 1 tablet (100 mg total) by mouth daily.    Dispense:  30 tablet    Refill:  0   Phentermine  HCl (LOMAIRA ) 8 MG TABS    Sig: 1 po qd    Dispense:  30 tablet    Refill:  0    FOR THE DISEASE OF OBESITY:  BMI 31.0-31.9,adult Current BMI 30.26 Obesity starting BMI 32.09 Assessment & Plan: Since last office visit on 06/11/2023 patient's  Muscle mass has decreased by 2.6 lb. Fat mass has increased by 3.8 lb. Total body water has increased by 2.8 lb.  Counseling done on how various foods will affect these numbers and how to maximize success  Total lbs lost to date: 12 lbs  Total weight loss percentage to date: 5.69%    Recommended Dietary Goals Crystal Simmons is currently in the action stage of change. As such, her goal is to continue weight management plan.  She has agreed to: continue current plan   Behavioral Intervention We discussed the following today: going to the [employer] website for recipe ideas and using GPT or another AI platform for recipe ideas- searching low calorie, low carb, high protein chicken recipes etc  Additional resources provided today: None  Evidence-based interventions for health behavior change were utilized today including the discussion of self monitoring techniques, problem-solving barriers  and SMART goal setting techniques.   Regarding patient's less desirable eating habits and patterns, we employed the technique of small changes.   Pt will specifically work on: n/a   Recommended Physical Activity Goals Crystal Simmons has been advised to work up to 300-450 minutes of moderate intensity aerobic activity a week and strengthening exercises 2-3 times per week for cardiovascular health, weight loss maintenance and preservation of muscle mass.   She has agreed to increase exercise once her back improves.    Pharmacotherapy See Polyphagia note.    ASSOCIATED CONDITIONS ADDRESSED TODAY:  Polyphagia Assessment & Plan: On a combination of Phentermine  8 mg daily and Topiramate  50 mg daily. Denies side effects. She has not noticed any difference in her excessive hunger.   Given patients h/o tachycardia, she will continue the same dose of Phentermine . However, I will INCREASE her Topiramate  to 100 mg daily. Review of PDMP was done and it showed that she is only getting her Phentermine  from us . Continue prudent nutritional plan.    Pre-diabetes Assessment & Plan: Lab Results  Component Value Date   HGBA1C 5.8 (H) 05/07/2023   HGBA1C 6.0 (H) 01/24/2023   HGBA1C 6.0 (H) 09/28/2022   INSULIN  10.2 05/07/2023   INSULIN  14.4 01/24/2023   INSULIN  10.6 09/28/2022    On Metformin  500 mg daily w/o GI issues. Cravings controlled.   Continue Metformin  therapy and working on  nutrition plan to decrease simple carbohydrates, increase lean proteins and exercise to promote weight loss and improve glycemic control and prevent progression to T2DM.   Follow up:   Return 08/21/2023 at 1:40 PM with Crystal Minus, MD. She was informed of the importance of frequent follow up visits to maximize her success with intensive lifestyle modifications for her multiple health conditions.  Subjective:   Chief complaint: Obesity Crystal Simmons is here to discuss her progress with her obesity treatment plan. She  is  keeping a food journal and adhering to recommended goals of 1200-1300 calories and 90 grams of protein and states she is following her eating plan approximately 80% of the time. She states she has not been exercising much the past 1 month because she pulled her back doing chair workouts.   Interval History:  Crystal Simmons is here for a follow up office visit.   Since last OV on 06/11/2023, Crystal Simmons is up 1 #. Attributes her wt gain to not being able to exercise for the past 1 mo.   Endorses staying within her calorie parameters the majority of the time.   Has 80-100 grams protein daily.   Drinks 90-100 oz of water daily.    Pharmacotherapy that aid w/ weight loss: She is prescribed Metformin  500 mg daily, Phentermine  8 mg daily, and Topiramate  50 mg daily.  Was on Wegovy  in the past however it was discontinued because because pt felt it did not do anything for her.   Review of Systems:  Pertinent positives were addressed with patient today.  Reviewed by clinician on day of visit: allergies, medications, problem list, medical history, surgical history, family history, social history, and previous encounter notes.  Weight Summary and Biometrics   Weight Lost Since Last Visit: 0  Weight Gained Since Last Visit: 1lb   Vitals Temp: 98.5 F (36.9 C) BP: 119/81 Pulse Rate: 67 SpO2: 92 %   Anthropometric Measurements Height: 5' 8 (1.727 m) Weight: 199 lb (90.3 kg) BMI (Calculated): 30.26 Weight at Last Visit: 198lb Weight Lost Since Last Visit: 0 Weight Gained Since Last Visit: 1lb Starting Weight: 211lb Total Weight Loss (lbs): 12 lb (5.443 kg)   Body Composition  Body Fat %: 38.8 % Fat Mass (lbs): 77.2 lbs Muscle Mass (lbs): 115.8 lbs Total Body Water (lbs): 86.4 lbs Visceral Fat Rating : 10   Other Clinical Data Fasting: yes Labs: no Today's Visit #: 37 Starting Date: 05/01/19   Objective:   PHYSICAL EXAM: Blood pressure 119/81, pulse 67, temperature  98.5 F (36.9 C), height 5' 8 (1.727 m), weight 199 lb (90.3 kg), last menstrual period 10/14/2005, SpO2 92%. Body mass index is 30.26 kg/m.  General: she is overweight, cooperative and in no acute distress. PSYCH: Has normal mood, affect and thought process.   HEENT: EOMI, sclerae are anicteric. Lungs: Normal breathing effort, no conversational dyspnea. Extremities: Moves * 4 Neurologic: A and O * 3, good insight  DIAGNOSTIC DATA REVIEWED: BMET    Component Value Date/Time   NA 141 05/07/2023 0803   K 4.5 05/07/2023 0803   CL 104 05/07/2023 0803   CO2 23 05/07/2023 0803   GLUCOSE 81 05/07/2023 0803   GLUCOSE 88 12/13/2015 1104   BUN 11 05/07/2023 0803   CREATININE 0.87 05/07/2023 0803   CREATININE 0.88 12/13/2015 1104   CALCIUM 9.6 05/07/2023 0803   GFRNONAA 68 03/31/2020 0758   GFRAA 79 03/31/2020 0758   Lab Results  Component Value Date   HGBA1C  5.8 (H) 05/07/2023   HGBA1C 5.4 12/13/2015   Lab Results  Component Value Date   INSULIN  10.2 05/07/2023   INSULIN  8.9 05/01/2019   Lab Results  Component Value Date   TSH 2.720 03/14/2022   CBC    Component Value Date/Time   WBC 7.3 03/14/2022 0820   WBC 7.4 12/13/2015 1104   RBC 3.97 03/14/2022 0820   RBC 3.84 12/13/2015 1104   HGB 12.2 03/14/2022 0820   HGB 11.3 (L) 12/13/2015 1347   HCT 38.3 03/14/2022 0820   PLT 314 03/14/2022 0820   MCV 97 03/14/2022 0820   MCH 30.7 03/14/2022 0820   MCH 30.7 12/13/2015 1104   MCHC 31.9 03/14/2022 0820   MCHC 32.4 12/13/2015 1104   RDW 12.9 03/14/2022 0820   Iron Studies    Component Value Date/Time   IRON 93 03/31/2020 0758   TIBC 264 03/31/2020 0758   FERRITIN 65 03/31/2020 0758   IRONPCTSAT 35 03/31/2020 0758   Lipid Panel     Component Value Date/Time   CHOL 174 03/14/2022 0820   TRIG 71 03/14/2022 0820   HDL 69 03/14/2022 0820   CHOLHDL 2.5 03/31/2020 0758   CHOLHDL 2.7 12/13/2015 1104   VLDL 11 12/13/2015 1104   LDLCALC 92 03/14/2022 0820    Hepatic Function Panel     Component Value Date/Time   PROT 7.2 05/07/2023 0803   ALBUMIN 4.0 05/07/2023 0803   AST 18 05/07/2023 0803   ALT 12 05/07/2023 0803   ALKPHOS 116 05/07/2023 0803   BILITOT 0.5 05/07/2023 0803      Component Value Date/Time   TSH 2.720 03/14/2022 0820   Nutritional Lab Results  Component Value Date   VD25OH 61.9 01/24/2023   VD25OH 67.4 09/28/2022   VD25OH 52.3 03/14/2022    Attestations:   I, Special Puri , acting as a Stage manager for [employer], DO., have compiled all relevant documentation for today's office visit on behalf of Marceil Sensor, DO, while in the presence of [employer], DO.  I have reviewed the above documentation for accuracy and completeness, and I agree with the above. Crystal Simmons, D.O.  The 21st Century Cures Act was signed into law in 2016 which includes the topic of electronic health records.  This provides immediate access to information in MyChart.  This includes consultation notes, operative notes, office notes, lab results and pathology reports.  If you have any questions about what you read please let us  know at your next visit so we can discuss your concerns and take corrective action if need be.  We are right here with you.

## 2023-07-27 NOTE — Progress Notes (Signed)
 Canceled appt.

## 2023-08-08 ENCOUNTER — Emergency Department (HOSPITAL_COMMUNITY)
Admission: EM | Admit: 2023-08-08 | Discharge: 2023-08-08 | Disposition: A | Discharge status: Home / Self Care | Attending: Emergency Medicine | Admitting: Emergency Medicine

## 2023-08-08 ENCOUNTER — Other Ambulatory Visit: Payer: Self-pay

## 2023-08-08 ENCOUNTER — Emergency Department (HOSPITAL_COMMUNITY)

## 2023-08-08 ENCOUNTER — Encounter (HOSPITAL_COMMUNITY): Payer: Self-pay

## 2023-08-08 DIAGNOSIS — R079 Chest pain, unspecified: Secondary | ICD-10-CM | POA: Diagnosis not present

## 2023-08-08 DIAGNOSIS — R1013 Epigastric pain: Secondary | ICD-10-CM | POA: Diagnosis not present

## 2023-08-08 DIAGNOSIS — R072 Precordial pain: Secondary | ICD-10-CM | POA: Insufficient documentation

## 2023-08-08 DIAGNOSIS — R7989 Other specified abnormal findings of blood chemistry: Secondary | ICD-10-CM | POA: Insufficient documentation

## 2023-08-08 DIAGNOSIS — R0789 Other chest pain: Secondary | ICD-10-CM | POA: Diagnosis not present

## 2023-08-08 DIAGNOSIS — R0902 Hypoxemia: Secondary | ICD-10-CM | POA: Diagnosis not present

## 2023-08-08 DIAGNOSIS — R748 Abnormal levels of other serum enzymes: Secondary | ICD-10-CM

## 2023-08-08 DIAGNOSIS — I959 Hypotension, unspecified: Secondary | ICD-10-CM | POA: Diagnosis not present

## 2023-08-08 LAB — TROPONIN I (HIGH SENSITIVITY)
Troponin I (High Sensitivity): 6 ng/L (ref <18)
Troponin I (High Sensitivity): 6 ng/L (ref <18)

## 2023-08-08 LAB — CBC WITH DIFFERENTIAL/PLATELET
Abs Immature Granulocytes: 0.05 10*3/uL (ref 0.00–0.07)
Basophils Absolute: 0 10*3/uL (ref 0.0–0.1)
Basophils Relative: 0 %
Eosinophils Absolute: 0.1 10*3/uL (ref 0.0–0.5)
Eosinophils Relative: 1 %
HCT: 36.9 % (ref 36.0–46.0)
Hemoglobin: 12 g/dL (ref 12.0–15.0)
Immature Granulocytes: 1 %
Lymphocytes Relative: 25 %
Lymphs Abs: 2.6 10*3/uL (ref 0.7–4.0)
MCH: 31.3 pg (ref 26.0–34.0)
MCHC: 32.5 g/dL (ref 30.0–36.0)
MCV: 96.1 fL (ref 80.0–100.0)
Monocytes Absolute: 0.6 10*3/uL (ref 0.1–1.0)
Monocytes Relative: 6 %
Neutro Abs: 6.9 10*3/uL (ref 1.7–7.7)
Neutrophils Relative %: 67 %
Platelets: 280 10*3/uL (ref 150–400)
RBC: 3.84 MIL/uL — ABNORMAL LOW (ref 3.87–5.11)
RDW: 13.8 % (ref 11.5–15.5)
WBC: 10.3 10*3/uL (ref 4.0–10.5)
nRBC: 0 % (ref 0.0–0.2)

## 2023-08-08 LAB — COMPREHENSIVE METABOLIC PANEL WITH GFR
ALT: 52 U/L — ABNORMAL HIGH (ref 0–44)
AST: 87 U/L — ABNORMAL HIGH (ref 15–41)
Albumin: 3.3 g/dL — ABNORMAL LOW (ref 3.5–5.0)
Alkaline Phosphatase: 80 U/L (ref 38–126)
Anion gap: 13 (ref 5–15)
BUN: 19 mg/dL (ref 8–23)
CO2: 20 mmol/L — ABNORMAL LOW (ref 22–32)
Calcium: 8.9 mg/dL (ref 8.9–10.3)
Chloride: 102 mmol/L (ref 98–111)
Creatinine, Ser: 0.88 mg/dL (ref 0.44–1.00)
GFR, Estimated: >60 mL/min (ref >60)
Glucose, Bld: 106 mg/dL — ABNORMAL HIGH (ref 70–99)
Potassium: 3.9 mmol/L (ref 3.5–5.1)
Sodium: 135 mmol/L (ref 135–145)
Total Bilirubin: 0.5 mg/dL (ref 0.0–1.2)
Total Protein: 6.7 g/dL (ref 6.5–8.1)

## 2023-08-08 LAB — BRAIN NATRIURETIC PEPTIDE: B Natriuretic Peptide: 17.9 pg/mL (ref 0.0–100.0)

## 2023-08-08 LAB — LIPASE, BLOOD: Lipase: 78 U/L — ABNORMAL HIGH (ref 11–51)

## 2023-08-08 MED ORDER — PANTOPRAZOLE SODIUM 40 MG PO TBEC: 40.0000 mg | DELAYED_RELEASE_TABLET | Freq: Two times a day (BID) | ORAL | 0 refills | Status: AC

## 2023-08-08 MED ORDER — ALUM & MAG HYDROXIDE-SIMETH 200-200-20 MG/5ML PO SUSP
30.0000 mL | Freq: Once | ORAL | Status: AC
  Administered 2023-08-08: 30 mL via ORAL
  Filled 2023-08-08: qty 30

## 2023-08-08 MED ORDER — SUCRALFATE 1 G PO TABS: 1.0000 g | ORAL_TABLET | Freq: Three times a day (TID) | ORAL | 0 refills | Status: AC

## 2023-08-08 MED ORDER — LIDOCAINE VISCOUS HCL 2 % MT SOLN
15.0000 mL | Freq: Once | OROMUCOSAL | Status: AC
  Administered 2023-08-08: 15 mL via ORAL
  Filled 2023-08-08: qty 15

## 2023-08-08 NOTE — Discharge Instructions (Addendum)
 It was a pleasure caring for you today in the emergency department.  Return to the Emergency Department if you have unusual chest pain, pressure, or discomfort, shortness of breath, nausea, vomiting, burping, heartburn, tingling upper body parts, sweating, cold, clammy skin, or racing heartbeat. Call 911 if you think you are having a heart attack. Take all cardiac medications as prescribed - notify your doctor if you have any side effects. Follow cardiac diet - avoid fatty & fried foods, don't eat too much red meat, eat lots of fruits & vegetables, and dairy products should be low fat. Please lose weight if you are overweight. Become more active with walking, gardening, or any other activity that gets you to moving.  Symptoms may be secondary to a problem with your pancreas. Be sure to eat a bland diet, follow up with GI if symptoms persist.    Please return to the emergency department immediately for any new or concerning symptoms, or if you get worse.

## 2023-08-08 NOTE — ED Provider Notes (Signed)
 [facility] Provider Note  CSN: [id number] Arrival date & time: 08/08/23 1706  Chief Complaint(s) Chest Pain (//)  HPI Crystal Simmons is a 63 y.o. female with past medical history as below, significant for obesity, CIN-1, endometriosis, fibroids who presents to the ED with complaint of chest pain  Patient reports she was sitting at her desk at work, began having midsternal chest tightness, pressure sensation.  Did not radiate.  She felt lightheaded, she felt hot and sweaty.  No palpitations or dyspnea.  No syncope.  Symptoms have gradually improved since the onset.  Denies similar symptoms in the past.  Unable to define any alleviating or exacerbating factors.  She does not follow cardiologist.  Denies history of personal heart disease or heart disease in immediate family members or heart attack in early age.  Does not smoke.  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   Insomnia    Joint pain    Lactose intolerance    Menopausal state 2007   Seasonal allergies    Shingles 04/2018   STD (sexually transmitted disease) 8/09   chlamydia   Vitamin D  deficiency disease 2010   Patient Active Problem List   Diagnosis Date Noted   Class 1 obesity with body mass index (BMI) of 32.0 to 32.9 in adult 05/10/2023   Polyphagia 07/03/2022   BMI 31.0-31.9,adult Current BMI 31.0 07/03/2022   Other insomnia 04/18/2021   Other constipation 04/18/2021   Vitamin D  deficiency 04/18/2021   At risk for deficient intake of food 04/18/2021   Chronic idiopathic constipation 03/01/2020   History of colonic polyps 03/01/2020   Prediabetes 11/27/2019   Insomnia 11/27/2019   Generalized  obesity 11/27/2019   Bilateral primary osteoarthritis of knee 05/18/2016   Post-menopausal atrophic vaginitis 02/29/2016   Home Medication(s) Prior to Admission medications   Medication Sig Start Date End Date Taking? Authorizing Provider  pantoprazole (PROTONIX) 40 MG tablet Take 1 tablet (40 mg total) by mouth 2 (two) times daily for 14 days. 08/08/23 08/22/23 Yes Elnor Savant A, DO  sucralfate (CARAFATE) 1 g tablet Take 1 tablet (1 g total) by mouth with breakfast, with lunch, and with evening meal for 7 days. 08/08/23 08/15/23 Yes Elnor Savant A, DO  Ascorbic Acid (VITAMIN C PO) Take by mouth.    [provider]  cetirizine (ZYRTEC) 10 MG tablet Take 10 mg by mouth daily.    [provider]  Cholecalciferol (VITAMIN D3) 50 MCG (2000 UT) TABS Take by mouth.    [provider]  ELDERBERRY PO Take by mouth. + zinc    [provider]  Estradiol  (YUVAFEM ) 10 MCG TABS vaginal tablet Place 1 tablet (10 mcg total) vaginally 2 (two) times a week. 02/15/23   Chrzanowski, Jami B, NP  fish oil-omega-3 fatty acids 1000 MG capsule Take 2 g by mouth daily.    [provider]  fluticasone (FLONASE) 50 MCG/ACT nasal spray Place 2 sprays into the nose daily.    [provider]  metFORMIN  (GLUCOPHAGE ) 500 MG tablet TAKE 1 TABLET BY MOUTH EVERY DAY WITH LUNCH 07/24/23   Midge Sober, DO  metoprolol  succinate (TOPROL  XL) 25 MG 24 hr tablet Take 1 tablet (25 mg total) by mouth daily. 02/23/23   Tobb, Kardie, DO  Multiple Vitamins-Minerals (MULTIVITAMIN WITH MINERALS) tablet Take 1 tablet by mouth daily.    [provider]  NONFORMULARY OR COMPOUNDED ITEM 30 gram, Testosterone  PLO Gel or Anhydrous Versabase 4%, apply pea sized amount to inner thigh at bedtime 02/20/23   Chrzanowski, Jami B, NP  Phentermine  HCl (LOMAIRA ) 8 MG TABS 1 po qd 07/24/23   Opalski, Deborah, DO  PREBIOTIC PRODUCT PO Take by mouth.    [provider]  topiramate  (TOPAMAX ) 100  MG tablet Take 1 tablet (100 mg total) by mouth daily. 07/24/23   Midge Sober, DO                                                                                                                                    Past Surgical History Past Surgical History:  Procedure Laterality Date   BREAST SURGERY Left 6/03   Biopsy negative   CERVIX LESION DESTRUCTION  4/92   COLONOSCOPY W/ BIOPSIES  09/26/10   polyp benign   COLPOSCOPY  10/12   CIN I   KNEE SURGERY  12/2009   left for meniscal tear   LAPAROSCOPY  7/02   multiple surgeries, including LSO, LOA, and endometrioma, and fibroids secondary to endometriosis   UMBILICAL EXPLORATION  6/05   for endometrioma    Family History Family History  Problem Relation Age of Onset   Hypertension Mother    Diabetes Father    Hypertension Father    Heart disease Father    Sudden death Father    Diabetes Brother    Diabetes Paternal Grandfather     Social History Social History   Tobacco Use   Smoking status: Never    Passive exposure: Never   Smokeless tobacco: Never  Vaping Use   Vaping status: Never Used  Substance Use Topics   Alcohol use: Yes    Comment: 2-3 times a month   Drug use: No   Allergies Phentermine  hcl, Sulfa antibiotics, and Codeine  Review of Systems A thorough review of systems was obtained and all systems are negative except as noted in the HPI and PMH.   Physical Exam Vital Signs  I have reviewed the triage vital signs BP 128/87 (BP Location: Right Arm)   Pulse 69   Temp 98.6 F (37 C) (Oral)   Resp 19   Ht 5' 8 (1.727 m)   Wt 90.3 kg   LMP 10/14/2005 (Approximate)   SpO2 100%   BMI 30.26 kg/m  Physical Exam Vitals and nursing note reviewed.  Constitutional:      General: She is not in acute distress.    Appearance: Normal appearance. She is not ill-appearing.  HENT:     Head: Normocephalic and atraumatic.     Right Ear: External ear normal.  Left Ear: External ear normal.     Nose:  Nose normal.     Mouth/Throat:     Mouth: Mucous membranes are moist.   Eyes:     General: No scleral icterus.       Right eye: No discharge.        Left eye: No discharge.    Cardiovascular:     Rate and Rhythm: Normal rate and regular rhythm.     Pulses: Normal pulses.     Heart sounds: Normal heart sounds.  Pulmonary:     Effort: Pulmonary effort is normal. No respiratory distress.     Breath sounds: Normal breath sounds. No stridor. No decreased breath sounds.  Abdominal:     General: Abdomen is flat. There is no distension.     Palpations: Abdomen is soft.     Tenderness: There is no abdominal tenderness.   Musculoskeletal:     Cervical back: No rigidity.     Right lower leg: No edema.     Left lower leg: No edema.   Skin:    General: Skin is warm and dry.     Capillary Refill: Capillary refill takes less than 2 seconds.   Neurological:     Mental Status: She is alert.   Psychiatric:        Mood and Affect: Mood normal.        Behavior: Behavior normal. Behavior is cooperative.     ED Results and Treatments Labs (all labs ordered are listed, but only abnormal results are displayed) Labs Reviewed  COMPREHENSIVE METABOLIC PANEL WITH GFR - Abnormal; Notable for the following components:      Result Value   CO2 20 (*)    Glucose, Bld 106 (*)    Albumin 3.3 (*)    AST 87 (*)    ALT 52 (*)    All other components within normal limits  CBC WITH DIFFERENTIAL/PLATELET - Abnormal; Notable for the following components:   RBC 3.84 (*)    All other components within normal limits  LIPASE, BLOOD - Abnormal; Notable for the following components:   Lipase 78 (*)    All other components within normal limits  BRAIN NATRIURETIC PEPTIDE  TROPONIN I (HIGH SENSITIVITY)  TROPONIN I (HIGH SENSITIVITY)                                                                                                                          Radiology DG Chest Port 1 View Result Date:  08/08/2023 CLINICAL DATA:  Chest pain EXAM: PORTABLE CHEST 1 VIEW COMPARISON:  Chest x-ray 12/04/2016 FINDINGS: The heart size and mediastinal contours are within normal limits. Both lungs are clear. The visualized skeletal structures are unremarkable. IMPRESSION: No active disease. Electronically Signed   By: Greig Pique M.D.   On: 08/08/2023 18:36    Pertinent labs & imaging results that were available during my care of the patient were reviewed by me and considered in my  medical decision making (see MDM for details).  Medications Ordered in ED Medications  alum & mag hydroxide-simeth (MAALOX/MYLANTA) 200-200-20 MG/5ML suspension 30 mL (30 mLs Oral Given 08/08/23 1953)    And  lidocaine  (XYLOCAINE ) 2 % viscous mouth solution 15 mL (15 mLs Oral Given 08/08/23 1953)                                                                                                                                     Procedures Procedures  (including critical care time)  Medical Decision Making / ED Course    Medical Decision Making:    JALAINE RIGGENBACH is a 63 y.o. female with past medical history as below, significant for obesity, CIN-1, endometriosis, fibroids who presents to the ED with complaint of chest pain. The complaint involves an extensive differential diagnosis and also carries with it a high risk of complications and morbidity.  Serious etiology was considered. Ddx includes but is not limited to: Differential includes all life-threatening causes for chest pain. This includes but is not exclusive to acute coronary syndrome, aortic dissection, pulmonary embolism, cardiac tamponade, community-acquired pneumonia, pericarditis, musculoskeletal chest wall pain, etc.   Complete initial physical exam performed, notably the patient was in no acute distress, HDS, symptoms improved.    Reviewed and confirmed nursing documentation for past medical history, family history, social history.  Vital signs reviewed.      Brief summary:  63 year old female history as above here with chest pain, has improved at time of presentation to the ER  Clinical Course as of 08/08/23 2220  Wed Aug 08, 2023  2132 Lipase(!): 78 Mild elev, she had some epigastric discomfort that since resolved. Liver enzymes are mildly elevated. No fever [SG]  2158 HEART score is 3, favor atypical cp. Possible 2/2 gi source [SG]    Clinical Course User Index [SG] Elnor Savant A, DO   Her lipase is mildly elevated, liver enzymes are mildly elevated. She is tolerating po w/o difficulty, no vomiting, no jaundice or fever. Possible mild pancreatitis. She has no sig ruq pain, doubt biliary etiology. Start ppi, carafate; have her f/u gi; she is eager for discharge  The patient's chest pain is not suggestive of pulmonary embolus, cardiac ischemia, aortic dissection, pericarditis, myocarditis, pulmonary embolism, pneumothorax, pneumonia, Zoster, or esophageal perforation, or other serious etiology.  Historically not abrupt in onset, tearing or ripping, pulses symmetric. EKG nonspecific for ischemia/infarction. No dysrhythmias, brugada, WPW, prolonged QT noted.   troponin negative x2. CXR reviewed. Labs without demonstration of acute pathology unless otherwise noted above. Low HEART Score: 0-3 points (0.9-1.7% risk of MACE)  Given the extremely low risk of these diagnoses further testing and evaluation for these possibilities does not appear to be indicated at this time. Patient in no distress and overall condition improved here in the ED. Detailed discussions were had with the patient regarding current findings, and need for close f/u with  PCP or on call doctor. The patient has been instructed to return immediately if the symptoms worsen in any way for re-evaluation. Patient verbalized understanding and is in agreement with current care plan. All questions answered prior to discharge.              Additional history  obtained: -Additional history obtained from family -External records from outside source obtained and reviewed including: Chart review including previous notes, labs, imaging, consultation notes including  Primary care documentation, home medications   Lab Tests: -I ordered, reviewed, and interpreted labs.   The pertinent results include:   Labs Reviewed  COMPREHENSIVE METABOLIC PANEL WITH GFR - Abnormal; Notable for the following components:      Result Value   CO2 20 (*)    Glucose, Bld 106 (*)    Albumin 3.3 (*)    AST 87 (*)    ALT 52 (*)    All other components within normal limits  CBC WITH DIFFERENTIAL/PLATELET - Abnormal; Notable for the following components:   RBC 3.84 (*)    All other components within normal limits  LIPASE, BLOOD - Abnormal; Notable for the following components:   Lipase 78 (*)    All other components within normal limits  BRAIN NATRIURETIC PEPTIDE  TROPONIN I (HIGH SENSITIVITY)  TROPONIN I (HIGH SENSITIVITY)    Notable for as above  EKG   EKG Interpretation Date/Time:  Wednesday August 08 2023 17:17:22 EDT Ventricular Rate:  78 PR Interval:  172 QRS Duration:  72 QT Interval:  364 QTC Calculation: 414 R Axis:   41  Text Interpretation: Normal sinus rhythm Low voltage QRS Abnormal ECG When compared with ECG of 23-Feb-2023 08:32, PREVIOUS ECG IS PRESENT similar to prior no stemi Confirmed by Elnor Savant ([id number]) on 08/08/2023 6:08:35 PM         Imaging Studies ordered: I ordered imaging studies including cxr I independently visualized the following imaging with scope of interpretation limited to determining acute life threatening conditions related to emergency care; findings noted above I agree with the radiologist interpretation If any imaging was obtained with contrast I closely monitored patient for any possible adverse reaction a/w contrast administration in the emergency department   Medicines ordered and prescription drug  management: Meds ordered this encounter  Medications   AND Linked Order Group    alum & mag hydroxide-simeth (MAALOX/MYLANTA) 200-200-20 MG/5ML suspension 30 mL    lidocaine  (XYLOCAINE ) 2 % viscous mouth solution 15 mL   pantoprazole (PROTONIX) 40 MG tablet    Sig: Take 1 tablet (40 mg total) by mouth 2 (two) times daily for 14 days.    Dispense:  28 tablet    Refill:  0   sucralfate (CARAFATE) 1 g tablet    Sig: Take 1 tablet (1 g total) by mouth with breakfast, with lunch, and with evening meal for 7 days.    Dispense:  21 tablet    Refill:  0    -I have reviewed the patients home medicines and have made adjustments as needed   Consultations Obtained: na   Cardiac Monitoring: The patient was maintained on a cardiac monitor.  I personally viewed and interpreted the cardiac monitored which showed an underlying rhythm of: nsr Continuous pulse oximetry interpreted by myself, 100% on ra.    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    Final Clinical Impression(s) / ED Diagnoses Final diagnoses:  Elevated lipase  Precordial chest pain         Elnor Jayson LABOR, DO 08/08/23 2220

## 2023-08-08 NOTE — ED Triage Notes (Signed)
 Pt to ED via [facility] from work c/o CP/epigastric pain for the past 2 hours, constant in nature. Pt was sitting at computer at work when symptoms started. No relieving or aggravating factors.   EKG NSR  324MG  Aspirin given by EMS  #20LAC.   Last VS:  164/86, HR 82, 97%RA, CBG 137.

## 2023-08-09 ENCOUNTER — Encounter (INDEPENDENT_AMBULATORY_CARE_PROVIDER_SITE_OTHER): Payer: Self-pay | Admitting: Family Medicine

## 2023-08-20 DIAGNOSIS — R748 Abnormal levels of other serum enzymes: Secondary | ICD-10-CM | POA: Diagnosis not present

## 2023-08-20 DIAGNOSIS — R7303 Prediabetes: Secondary | ICD-10-CM | POA: Diagnosis not present

## 2023-08-20 DIAGNOSIS — K219 Gastro-esophageal reflux disease without esophagitis: Secondary | ICD-10-CM | POA: Diagnosis not present

## 2023-08-20 DIAGNOSIS — R945 Abnormal results of liver function studies: Secondary | ICD-10-CM | POA: Diagnosis not present

## 2023-08-21 ENCOUNTER — Ambulatory Visit (INDEPENDENT_AMBULATORY_CARE_PROVIDER_SITE_OTHER): Admitting: Family Medicine

## 2023-08-21 NOTE — Progress Notes (Signed)
 Cardiology Office Note:  .   Date:  08/28/2023  ID:  Glendale GORMAN Gull, DOB [birth year], MRN [id number] PCP: Cleotilde Planas, MD  [facility] Providers Cardiologist:  Dub Huntsman, DO    History of Present Illness: Crystal Simmons   Crystal Simmons is a 63 y.o. female with history of vitamin D  deficiency, prediabetes, NSVT, PSVT, GERD.  Patient went to the ED 6/25 with reports she was sitting at her desk at work, after eating lunch began having midsternal chest tightness, pressure sensation. Did not radiate. She felt lightheaded, she felt hot and sweaty. No palpitations or dyspnea. No syncope. Symptoms have gradually improved. Lipase elevated 78 with some epigastric discomfort and mildly elevated LFT's. Troponins negative x 2. Possible mild pericarditis and to f/u with GI. Recently switched from wegovy  to phentermine  but she stopped it. Dr. Cleotilde repeated lipase and LFT's per patient and lipase was normal.  Patient exercises regularly, 30 min on treadmill 4 days a week, weights 2 days a week. No chest pain, dyspnea, edema. She's not having any palpitations. Dr. Huntsman had increased toprol  xl 25 mg daily back in Jan but pharmacy wouldn't fill this dose so she's only taking 1/2 tablet and doing fine.   ROS:    Studies Reviewed: Crystal Simmons         Prior CV Studies:   TTE 01/25/2021 FINDINGS   Left Ventricle: Left ventricular ejection fraction, by estimation, is 50  to 55%. Left ventricular ejection fraction by 3D volume is 51 %. The left  ventricle has low normal function. The left ventricle has no regional wall  motion abnormalities. The  average left ventricular global longitudinal strain is -20.8 %. The global  longitudinal strain is normal. The left ventricular internal cavity size  was normal in size. There is no left ventricular hypertrophy. Left  ventricular diastolic parameters are  consistent with Grade I diastolic dysfunction (impaired relaxation).  Indeterminate filling pressures.   Right Ventricle:  The right ventricular size is normal. No increase in  right ventricular wall thickness. Right ventricular systolic function is  normal. Tricuspid regurgitation signal is inadequate for assessing PA  pressure.   Left Atrium: Left atrial size was normal in size.   Right Atrium: Right atrial size was normal in size.   Pericardium: There is no evidence of pericardial effusion.   Mitral Valve: The mitral valve is abnormal. There is mild thickening of  the mitral valve leaflet(s). Trivial mitral valve regurgitation.   Tricuspid Valve: The tricuspid valve is grossly normal. Tricuspid valve  regurgitation is trivial.   Aortic Valve: The aortic valve is tricuspid. Aortic valve regurgitation is  not visualized.   Pulmonic Valve: The pulmonic valve was grossly normal. Pulmonic valve  regurgitation is trivial.   Aorta: The aortic root and ascending aorta are structurally normal, with  no evidence of dilitation.   Venous: The inferior vena cava is normal in size with less than 50%  respiratory variability, suggesting right atrial pressure of 8 mmHg.   IAS/Shunts: No atrial level shunt detected by color flow Doppler.        CCTA 02/16/2021 Aorta: Normal size.  No calcifications.  No dissection.   Aortic Valve:  Trileaflet.  No calcifications.   Coronary Arteries:  Normal coronary origin.  Right dominance.   RCA is a large dominant artery that gives rise to PDA and PLA. There is no plaque.   Left main is a large artery that gives rise to LAD and LCX arteries.  LAD is a large vessel that has no plaque.   LCX is a non-dominant artery that gives rise to one large OM1 branch. There is no plaque.   Coronary Calcium Score:   Left main: 0   Left anterior descending artery: 0   Left circumflex artery: 0   Right coronary artery: 0   Total: 0   Percentile: 0   Other findings:   Normal pulmonary vein drainage into the left atrium.   Normal left atrial appendage without a  thrombus.   Normal size of the pulmonary artery.   Small left ventricular diverticulum.   IMPRESSION: 1. Coronary calcium score of 0. This was 0 percentile for age and sex matched control.   2. Normal coronary origin with right dominance.   3. No evidence of CAD. CAD-RADS 0. No evidence of CAD (0%). Consider non-atherosclerotic causes of chest pain.   4.  Small Left ventricular diverticulum.     Electronically Signed   By: Kardie  Tobb D.O.   On: 02/16/2021 16:59    Addended by Tobb, Kardie, DO on 02/16/2021  5:02 PM    Study Result   Narrative & Impression  EXAM: OVER-READ INTERPRETATION  CT CHEST   The following report is an over-read performed by radiologist Dr. Rockey Kilts of [facility] on 02/16/2021. This over-read does not include interpretation of cardiac or coronary anatomy or pathology. The coronary CTA interpretation by the cardiologist is attached.   COMPARISON:  Chest and rib radiographs of 12/04/2016.   FINDINGS: Vascular: Normal aortic caliber. No central pulmonary embolism, on this non-dedicated study.   Mediastinum/Nodes: No imaged thoracic adenopathy.   Lungs/Pleura: No pleural fluid.  Clear imaged lungs.   Upper Abdomen: Normal imaged portions of the liver, spleen, stomach.   Musculoskeletal: No acute osseous abnormality.   IMPRESSION: No acute findings in the imaged extracardiac chest.   Electronically Signed: By: Rockey Kilts M.D. On: 02/16/2021 16:46      Zio monitor 01/26/2021 Patch Wear Time:  12 days and 7 hours (2022-11-25T20:29:08-498 to 2022-12-08T03:50:19-0500)   Patient had a min HR of 54 bpm, max HR of 222 bpm, and avg HR of 82 bpm. Predominant underlying rhythm was Sinus Rhythm. 7 Ventricular Tachycardia runs occurred, the run with the fastest interval lasting 7 beats with a max rate of 218 bpm, the longest  lasting 11 beats with an avg rate of 120 bpm. 7 Supraventricular Tachycardia runs occurred, the run with  the fastest interval lasting 13.3 secs with a max rate of 222 bpm (avg 169 bpm); the run with the fastest interval was also the longest. Isolated  SVEs were rare (<1.0%), SVE Couplets were rare (<1.0%), and SVE Triplets were rare (<1.0%). Isolated VEs were rare (<1.0%, 5033), VE Couplets were rare (<1.0%, 16), and VE Triplets were rare (<1.0%, 1). Ventricular Bigeminy was present. Inverted QRS  complexes possibly due to inverted placement of device.     Conclusion: This study is markable for the following:                                  1.  Short run of nonsustained ventricular tachycardia                                  2.  Paroxysmal supraventricular tachycardia.   This was already discussed with the patient  she has been started on low-dose beta-blocker.  Risk Assessment/Calculations:             Physical Exam:   VS:  BP 108/74   Pulse 93   Ht 5' 8 (1.727 m)   Wt 207 lb 9.6 oz (94.2 kg)   LMP 10/14/2005 (Approximate)   SpO2 95%   BMI 31.57 kg/m    Orhtostatics: No data found. Wt Readings from Last 3 Encounters:  08/28/23 207 lb 9.6 oz (94.2 kg)  08/08/23 199 lb (90.3 kg)  07/24/23 199 lb (90.3 kg)    GEN: Well nourished, well developed in no acute distress NECK: No JVD; No carotid bruits CARDIAC:  RRR, no murmurs, rubs, gallops RESPIRATORY:  Clear to auscultation without rales, wheezing or rhonchi  ABDOMEN: Soft, non-tender, non-distended EXTREMITIES:  No edema; No deformity   ASSESSMENT AND PLAN: .    Chest pain as above in ED 08/08/23 increase lipase and LFT's, ? Mild pancreatitis. Was recently switched from wegovy  to phentermine  which she stopped. EKG normal, troponins negative, exercises regularly without chest pain. Normal coronary CTA 2 yrs ago. No indication for further cardiac w/u.  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 PCP and healthy weight loss manage  Prediabetes.        Dispo: f/u in 1 yr.    Signed, Olivia Pavy, PA-C

## 2023-08-28 ENCOUNTER — Ambulatory Visit: Attending: Physician Assistant | Admitting: Physician Assistant

## 2023-08-28 ENCOUNTER — Encounter: Payer: Self-pay | Admitting: Physician Assistant

## 2023-08-28 VITALS — BP 108/74 | HR 93 | Ht 68.0 in | Wt 207.6 lb

## 2023-08-28 DIAGNOSIS — I4729 Other ventricular tachycardia: Secondary | ICD-10-CM | POA: Diagnosis not present

## 2023-08-28 DIAGNOSIS — E782 Mixed hyperlipidemia: Secondary | ICD-10-CM | POA: Diagnosis not present

## 2023-08-28 DIAGNOSIS — I471 Supraventricular tachycardia, unspecified: Secondary | ICD-10-CM

## 2023-08-28 DIAGNOSIS — R079 Chest pain, unspecified: Secondary | ICD-10-CM

## 2023-08-28 DIAGNOSIS — R7303 Prediabetes: Secondary | ICD-10-CM

## 2023-08-28 NOTE — Patient Instructions (Signed)
 Medication Instructions:  Your physician recommends that you continue on your current medications as directed. Please refer to the Current Medication list given to you today.  *If you need a refill on your cardiac medications before your next appointment, please call your pharmacy*  Lab Work: None ordered  If you have labs (blood work) drawn today and your tests are completely normal, you will receive your results only by: MyChart Message (if you have MyChart) OR A paper copy in the mail If you have any lab test that is abnormal or we need to change your treatment, we will call you to review the results.  Testing/Procedures: None ordered  Follow-Up: At [facility], you and your health needs are our priority.  As part of our continuing mission to provide you with exceptional heart care, our providers are all part of one team.  This team includes your primary Cardiologist (physician) and Advanced Practice Providers or APPs (Physician Assistants and Nurse Practitioners) who all work together to provide you with the care you need, when you need it.  Your next appointment:   1 year(s)  Provider:   Kardie Tobb, DO    We recommend signing up for the patient portal called MyChart.  Sign up information is provided on this After Visit Summary.  MyChart is used to connect with patients for Virtual Visits (Telemedicine).  Patients are able to view lab/test results, encounter notes, upcoming appointments, etc.  Non-urgent messages can be sent to your provider as well.   To learn more about what you can do with MyChart, go to [email or web address].   Other Instructions

## 2023-09-24 DIAGNOSIS — K219 Gastro-esophageal reflux disease without esophagitis: Secondary | ICD-10-CM | POA: Diagnosis not present

## 2023-09-24 DIAGNOSIS — Z113 Encounter for screening for infections with a predominantly sexual mode of transmission: Secondary | ICD-10-CM | POA: Diagnosis not present

## 2023-10-03 ENCOUNTER — Ambulatory Visit: Admitting: Radiology

## 2023-11-19 DIAGNOSIS — E119 Type 2 diabetes mellitus without complications: Secondary | ICD-10-CM | POA: Diagnosis not present

## 2023-11-19 DIAGNOSIS — H40053 Ocular hypertension, bilateral: Secondary | ICD-10-CM | POA: Diagnosis not present

## 2023-11-19 DIAGNOSIS — H40013 Open angle with borderline findings, low risk, bilateral: Secondary | ICD-10-CM | POA: Diagnosis not present

## 2024-01-18 DIAGNOSIS — E669 Obesity, unspecified: Secondary | ICD-10-CM | POA: Diagnosis not present

## 2024-01-18 DIAGNOSIS — R7303 Prediabetes: Secondary | ICD-10-CM | POA: Diagnosis not present

## 2024-01-18 DIAGNOSIS — E559 Vitamin D deficiency, unspecified: Secondary | ICD-10-CM | POA: Diagnosis not present

## 2024-01-18 DIAGNOSIS — H6121 Impacted cerumen, right ear: Secondary | ICD-10-CM | POA: Diagnosis not present

## 2024-01-18 DIAGNOSIS — Z79899 Other long term (current) drug therapy: Secondary | ICD-10-CM | POA: Diagnosis not present

## 2024-01-18 DIAGNOSIS — R002 Palpitations: Secondary | ICD-10-CM | POA: Diagnosis not present

## 2024-01-18 DIAGNOSIS — K5900 Constipation, unspecified: Secondary | ICD-10-CM | POA: Diagnosis not present

## 2024-01-18 DIAGNOSIS — K219 Gastro-esophageal reflux disease without esophagitis: Secondary | ICD-10-CM | POA: Diagnosis not present

## 2024-01-18 DIAGNOSIS — Z0000 Encounter for general adult medical examination without abnormal findings: Secondary | ICD-10-CM | POA: Diagnosis not present

## 2024-02-19 ENCOUNTER — Other Ambulatory Visit (HOSPITAL_COMMUNITY)
Admission: RE | Admit: 2024-02-19 | Discharge: 2024-02-19 | Disposition: A | Discharge status: Home / Self Care | Source: Ambulatory Visit | Attending: Radiology | Admitting: Radiology

## 2024-02-19 ENCOUNTER — Ambulatory Visit: Admitting: Radiology

## 2024-02-19 ENCOUNTER — Encounter: Payer: Self-pay | Admitting: Radiology

## 2024-02-19 VITALS — BP 130/82 | Ht 68.0 in | Wt 218.0 lb

## 2024-02-19 DIAGNOSIS — Z01419 Encounter for gynecological examination (general) (routine) without abnormal findings: Secondary | ICD-10-CM | POA: Diagnosis not present

## 2024-02-19 DIAGNOSIS — L739 Follicular disorder, unspecified: Secondary | ICD-10-CM | POA: Diagnosis not present

## 2024-02-19 DIAGNOSIS — Z1331 Encounter for screening for depression: Secondary | ICD-10-CM

## 2024-02-19 DIAGNOSIS — N958 Other specified menopausal and perimenopausal disorders: Secondary | ICD-10-CM

## 2024-02-19 MED ORDER — ESTRADIOL 10 MCG VA TABS: 1.0000 | ORAL_TABLET | VAGINAL | 11 refills | Status: AC

## 2024-02-19 MED ORDER — CEPHALEXIN 500 MG PO CAPS: 500.0000 mg | ORAL_CAPSULE | Freq: Four times a day (QID) | ORAL | 0 refills | Status: AC

## 2024-02-19 NOTE — Progress Notes (Signed)
 "  Crystal Simmons Jul 10, 1960 [id number]   History: Postmenopausal 64 y.o. presents for annual exam. C/o bump on mons from shaving last month, has some purulent drainage, using hot compresses. No other gyn concerns. Still using vaginal estrogen twice weekly.   Gynecologic History Postmenopausal Last Pap: 2020. Results were: normal Last mammogram: 5/25. Results were: normal Last colonoscopy: 2017   Obstetric History OB History  Gravida Para Term Preterm AB Living  0 0 0 0 0 0  SAB IAB Ectopic Multiple Live Births  0 0 0 0 0       02/19/2024    3:58 PM 02/13/2023    9:06 AM 05/01/2019    8:17 AM  Depression screen PHQ 2/9  Decreased Interest 0 0 1  Down, Depressed, Hopeless 0 0 1  PHQ - 2 Score 0 0 2  Altered sleeping   1  Tired, decreased energy   1  Change in appetite   1  Feeling bad or failure about yourself    2  Trouble concentrating   2  Moving slowly or fidgety/restless   0  Suicidal thoughts   0  PHQ-9 Score   9   Difficult doing work/chores   Not difficult at all     Data saved with a previous flowsheet row definition     The following portions of the patient's history were reviewed and updated as appropriate: allergies, current medications, past family history, past medical history, past social history, past surgical history, and problem list.  Review of Systems Pertinent items noted in HPI and remainder of comprehensive ROS otherwise negative.  Past medical history, past surgical history, family history and social history were all reviewed and documented in the EPIC chart.  Exam:  Vitals:   02/19/24 1557  BP: 130/82  Weight: 218 lb (98.9 kg)  Height: 5' 8 (1.727 m)   Body mass index is 33.15 kg/m.  General appearance:  Normal Thyroid :  Symmetrical, normal in size, without palpable masses or nodularity. Respiratory  Auscultation:  Clear without wheezing or rhonchi Cardiovascular  Auscultation:  Regular rate, without rubs, murmurs or  gallops  Edema/varicosities:  Not grossly evident Abdominal  Soft,nontender, without masses, guarding or rebound.  Liver/spleen:  No organomegaly noted  Hernia:  None appreciated  Skin  Inspection:  Grossly normal Breasts: Examined lying and sitting.   Right: Without masses, retractions, nipple discharge or axillary adenopathy.   Left: Without masses, retractions, nipple discharge or axillary adenopathy. Genitourinary   Inguinal/mons:  2cm area of erythematous folliculitis on mid right mons. Yellow purulent drainage with pressure. Without inguinal adenopathy  External genitalia:  Normal appearing vulva with no masses, tenderness, or lesions  BUS/Urethra/Skene's glands:  Normal  Vagina:  Normal appearing with normal color and discharge, no lesions. Atrophy: mild   Cervix:  Normal appearing without discharge or lesions  Uterus:  Normal in size, shape and contour.  Midline and mobile, nontender  Adnexa/parametria:     Rt: Normal in size, without masses or tenderness.   Lt: Normal in size, without masses or tenderness.  Anus and perineum: Normal    Darice Hoit, CMA present for exam  Assessment/Plan:   1. Well woman exam with routine gynecological exam (Primary) - Cytology - PAP( [facility])  2. Folliculitis Continue warm compresses 2-3x/day - cephALEXin  (KEFLEX ) 500 MG capsule; Take 1 capsule (500 mg total) by mouth 4 (four) times daily.  Dispense: 28 capsule; Refill: 0  3. Genitourinary syndrome of menopause - Estradiol  (YUVAFEM ) 10  MCG TABS vaginal tablet; Place 1 tablet (10 mcg total) vaginally 2 (two) times a week.  Dispense: 8 tablet; Refill: 11  4. Depression screen negative    Return in 1 year for annual or sooner prn.  Deronte Solis B WHNP-BC, 4:11 PM 02/19/2024 "

## 2024-02-19 NOTE — Patient Instructions (Signed)
 Preventive Care 58-64 Years Old, Female  Preventive care refers to lifestyle choices and visits with your health care provider that can promote health and wellness. Preventive care visits are also called wellness exams.  What can I expect for my preventive care visit?  Counseling  Your health care provider may ask you questions about your:  Medical history, including:  Past medical problems.  Family medical history.  Pregnancy history.  Current health, including:  Menstrual cycle.  Method of birth control.  Emotional well-being.  Home life and relationship well-being.  Sexual activity and sexual health.  Lifestyle, including:  Alcohol, nicotine or tobacco, and drug use.  Access to firearms.  Diet, exercise, and sleep habits.  Work and work Astronomer.  Sunscreen use.  Safety issues such as seatbelt and bike helmet use.  Physical exam  Your health care provider will check your:  Height and weight. These may be used to calculate your BMI (body mass index). BMI is a measurement that tells if you are at a healthy weight.  Waist circumference. This measures the distance around your waistline. This measurement also tells if you are at a healthy weight and may help predict your risk of certain diseases, such as type 2 diabetes and high blood pressure.  Heart rate and blood pressure.  Body temperature.  Skin for abnormal spots.  What immunizations do I need?    Vaccines are usually given at various ages, according to a schedule. Your health care provider will recommend vaccines for you based on your age, medical history, and lifestyle or other factors, such as travel or where you work.  What tests do I need?  Screening  Your health care provider may recommend screening tests for certain conditions. This may include:  Lipid and cholesterol levels.  Diabetes screening. This is done by checking your blood sugar (glucose) after you have not eaten for a while (fasting).  Pelvic exam and Pap test.  Hepatitis B test.  Hepatitis C  test.  HIV (human immunodeficiency virus) test.  STI (sexually transmitted infection) testing, if you are at risk.  Lung cancer screening.  Colorectal cancer screening.  Mammogram. Talk with your health care provider about when you should start having regular mammograms. This may depend on whether you have a family history of breast cancer.  BRCA-related cancer screening. This may be done if you have a family history of breast, ovarian, tubal, or peritoneal cancers.  Bone density scan. This is done to screen for osteoporosis.  Talk with your health care provider about your test results, treatment options, and if necessary, the need for more tests.  Follow these instructions at home:  Eating and drinking    Eat a diet that includes fresh fruits and vegetables, whole grains, lean protein, and low-fat dairy products.  Take vitamin and mineral supplements as recommended by your health care provider.  Do not drink alcohol if:  Your health care provider tells you not to drink.  You are pregnant, may be pregnant, or are planning to become pregnant.  If you drink alcohol:  Limit how much you have to 0-1 drink a day.  Know how much alcohol is in your drink. In the [place] one drink equals one 12 oz bottle of beer (355 mL), one 5 oz glass of wine (148 mL), or one 1 oz glass of hard liquor (44 mL).  Lifestyle  Brush your teeth every morning and night with fluoride toothpaste. Floss one time each day.  Exercise for at least  30 minutes 5 or more days each week.  Do not use any products that contain nicotine or tobacco. These products include cigarettes, chewing tobacco, and vaping devices, such as e-cigarettes. If you need help quitting, ask your health care provider.  Do not use drugs.  If you are sexually active, practice safe sex. Use a condom or other form of protection to prevent STIs.  If you do not wish to become pregnant, use a form of birth control. If you plan to become pregnant, see your health care provider for a  prepregnancy visit.  Take aspirin only as told by your health care provider. Make sure that you understand how much to take and what form to take. Work with your health care provider to find out whether it is safe and beneficial for you to take aspirin daily.  Find healthy ways to manage stress, such as:  Meditation, yoga, or listening to music.  Journaling.  Talking to a trusted person.  Spending time with friends and family.  Minimize exposure to UV radiation to reduce your risk of skin cancer.  Safety  Always wear your seat belt while driving or riding in a vehicle.  Do not drive:  If you have been drinking alcohol. Do not ride with someone who has been drinking.  When you are tired or distracted.  While texting.  If you have been using any mind-altering substances or drugs.  Wear a helmet and other protective equipment during sports activities.  If you have firearms in your house, make sure you follow all gun safety procedures.  Seek help if you have been physically or sexually abused.  What's next?  Visit your health care provider once a year for an annual wellness visit.  Ask your health care provider how often you should have your eyes and teeth checked.  Stay up to date on all vaccines.  This information is not intended to replace advice given to you by your health care provider. Make sure you discuss any questions you have with your health care provider.  Document Revised: 07/28/2020 Document Reviewed: 07/28/2020  Elsevier Patient Education  2024 [employer].

## 2024-02-20 ENCOUNTER — Other Ambulatory Visit: Payer: Self-pay | Admitting: Radiology

## 2024-02-20 MED ORDER — FLUCONAZOLE 150 MG PO TABS: 150.0000 mg | ORAL_TABLET | ORAL | 0 refills | Status: AC

## 2024-02-20 NOTE — Telephone Encounter (Signed)
 Patient was seen in office on 02/19/24 for AEX. Rx for Keflex  sent for folliculitis.   Please advise on diflucan .

## 2024-02-21 ENCOUNTER — Encounter: Payer: Self-pay | Admitting: Cardiology

## 2024-02-21 ENCOUNTER — Ambulatory Visit: Payer: Self-pay | Admitting: Radiology

## 2024-02-21 LAB — CYTOLOGY - PAP
Comment: NEGATIVE
Diagnosis: NEGATIVE
High risk HPV: NEGATIVE

## 2024-02-27 ENCOUNTER — Other Ambulatory Visit: Payer: Self-pay | Admitting: Cardiology

## 2024-05-09 ENCOUNTER — Ambulatory Visit: Admitting: Cardiology
